# Patient Record
Sex: Male | Born: 1935 | Race: White | Hispanic: No | Marital: Married | Smoking: Former smoker
Health system: Southern US, Community
[De-identification: ages and names within clinical notes are randomized; demographics above are authoritative.]

## PROBLEM LIST (undated history)

## (undated) DIAGNOSIS — E875 Hyperkalemia: Secondary | ICD-10-CM

## (undated) DIAGNOSIS — Z8679 Personal history of other diseases of the circulatory system: Secondary | ICD-10-CM

## (undated) DIAGNOSIS — I639 Cerebral infarction, unspecified: Secondary | ICD-10-CM

## (undated) DIAGNOSIS — E785 Hyperlipidemia, unspecified: Secondary | ICD-10-CM

## (undated) DIAGNOSIS — C329 Malignant neoplasm of larynx, unspecified: Secondary | ICD-10-CM

## (undated) DIAGNOSIS — M8008XA Age-related osteoporosis with current pathological fracture, vertebra(e), initial encounter for fracture: Secondary | ICD-10-CM

## (undated) DIAGNOSIS — H53461 Homonymous bilateral field defects, right side: Secondary | ICD-10-CM

## (undated) DIAGNOSIS — I10 Essential (primary) hypertension: Secondary | ICD-10-CM

## (undated) DIAGNOSIS — E119 Type 2 diabetes mellitus without complications: Secondary | ICD-10-CM

## (undated) DIAGNOSIS — C760 Malignant neoplasm of head, face and neck: Secondary | ICD-10-CM

## (undated) DIAGNOSIS — R339 Retention of urine, unspecified: Secondary | ICD-10-CM

## (undated) HISTORY — PX: AMPUTATION FINGER / THUMB: SUR24

## (undated) HISTORY — DX: Essential (primary) hypertension: I10

## (undated) HISTORY — DX: Hyperkalemia: E87.5

## (undated) HISTORY — DX: Personal history of other diseases of the circulatory system: Z86.79

## (undated) HISTORY — DX: Age-related osteoporosis with current pathological fracture, vertebra(e), initial encounter for fracture: M80.08XA

## (undated) HISTORY — PX: PILONIDAL CYST EXCISION: SHX744

## (undated) HISTORY — PX: DOPPLER ECHOCARDIOGRAPHY: SHX263

## (undated) HISTORY — DX: Malignant neoplasm of head, face and neck: C76.0

## (undated) HISTORY — PX: RADICAL NECK DISSECTION: SHX2284

## (undated) HISTORY — PX: TOTAL LARYNGECTOMY: SHX2543

## (undated) HISTORY — DX: Hyperlipidemia, unspecified: E78.5

---

## 1995-02-25 ENCOUNTER — Encounter (INDEPENDENT_AMBULATORY_CARE_PROVIDER_SITE_OTHER): Payer: Self-pay | Admitting: Internal Medicine

## 2001-02-24 ENCOUNTER — Encounter (INDEPENDENT_AMBULATORY_CARE_PROVIDER_SITE_OTHER): Payer: Self-pay | Admitting: Internal Medicine

## 2001-02-24 LAB — CONVERTED CEMR LAB
Hgb A1c MFr Bld: 13.2 %
Microalbumin U total vol: 13.7 mg/L

## 2001-06-26 ENCOUNTER — Encounter (INDEPENDENT_AMBULATORY_CARE_PROVIDER_SITE_OTHER): Payer: Self-pay | Admitting: Internal Medicine

## 2001-06-26 LAB — CONVERTED CEMR LAB: Hgb A1c MFr Bld: 6.4 %

## 2001-10-26 ENCOUNTER — Encounter (INDEPENDENT_AMBULATORY_CARE_PROVIDER_SITE_OTHER): Payer: Self-pay | Admitting: Internal Medicine

## 2001-10-26 LAB — CONVERTED CEMR LAB: Hgb A1c MFr Bld: 6.7 %

## 2002-04-26 ENCOUNTER — Encounter (INDEPENDENT_AMBULATORY_CARE_PROVIDER_SITE_OTHER): Payer: Self-pay | Admitting: Internal Medicine

## 2002-04-26 LAB — CONVERTED CEMR LAB
Hgb A1c MFr Bld: 7 %
Microalbumin U total vol: 11.1 mg/L
PSA: 0.6 ng/mL

## 2002-10-26 ENCOUNTER — Encounter (INDEPENDENT_AMBULATORY_CARE_PROVIDER_SITE_OTHER): Payer: Self-pay | Admitting: Internal Medicine

## 2002-10-26 LAB — CONVERTED CEMR LAB: Hgb A1c MFr Bld: 6.8 %

## 2003-05-27 ENCOUNTER — Encounter (INDEPENDENT_AMBULATORY_CARE_PROVIDER_SITE_OTHER): Payer: Self-pay | Admitting: Internal Medicine

## 2003-10-27 ENCOUNTER — Encounter (INDEPENDENT_AMBULATORY_CARE_PROVIDER_SITE_OTHER): Payer: Self-pay | Admitting: Internal Medicine

## 2003-10-27 LAB — CONVERTED CEMR LAB: Hgb A1c MFr Bld: 9.4 %

## 2003-12-28 ENCOUNTER — Encounter (INDEPENDENT_AMBULATORY_CARE_PROVIDER_SITE_OTHER): Payer: Self-pay | Admitting: Internal Medicine

## 2003-12-28 LAB — CONVERTED CEMR LAB: Hgb A1c MFr Bld: 6.2 %

## 2004-06-26 ENCOUNTER — Encounter (INDEPENDENT_AMBULATORY_CARE_PROVIDER_SITE_OTHER): Payer: Self-pay | Admitting: Internal Medicine

## 2004-06-26 LAB — CONVERTED CEMR LAB: PSA: 0.4 ng/mL

## 2004-09-26 ENCOUNTER — Encounter (INDEPENDENT_AMBULATORY_CARE_PROVIDER_SITE_OTHER): Payer: Self-pay | Admitting: Internal Medicine

## 2004-10-17 ENCOUNTER — Ambulatory Visit: Payer: Self-pay | Admitting: Family Medicine

## 2004-12-27 ENCOUNTER — Encounter (INDEPENDENT_AMBULATORY_CARE_PROVIDER_SITE_OTHER): Payer: Self-pay | Admitting: Internal Medicine

## 2005-01-17 ENCOUNTER — Ambulatory Visit: Payer: Self-pay | Admitting: Family Medicine

## 2005-08-26 ENCOUNTER — Encounter (INDEPENDENT_AMBULATORY_CARE_PROVIDER_SITE_OTHER): Payer: Self-pay | Admitting: Internal Medicine

## 2005-08-26 LAB — CONVERTED CEMR LAB: Hgb A1c MFr Bld: 6.3 %

## 2005-09-18 ENCOUNTER — Ambulatory Visit: Payer: Self-pay | Admitting: Family Medicine

## 2005-10-09 ENCOUNTER — Ambulatory Visit: Payer: Self-pay | Admitting: Internal Medicine

## 2005-10-17 ENCOUNTER — Ambulatory Visit: Payer: Self-pay | Admitting: Internal Medicine

## 2005-11-26 HISTORY — PX: CATARACT EXTRACTION W/ INTRAOCULAR LENS  IMPLANT, BILATERAL: SHX1307

## 2006-08-26 ENCOUNTER — Encounter (INDEPENDENT_AMBULATORY_CARE_PROVIDER_SITE_OTHER): Payer: Self-pay | Admitting: Internal Medicine

## 2006-08-26 LAB — CONVERTED CEMR LAB
Hgb A1c MFr Bld: 7.7 %
PSA: 0.65 ng/mL

## 2006-09-04 ENCOUNTER — Ambulatory Visit: Payer: Self-pay | Admitting: Family Medicine

## 2006-09-19 ENCOUNTER — Ambulatory Visit: Payer: Self-pay | Admitting: Family Medicine

## 2006-10-01 ENCOUNTER — Ambulatory Visit: Payer: Self-pay | Admitting: Internal Medicine

## 2006-12-05 ENCOUNTER — Ambulatory Visit: Payer: Self-pay | Admitting: Internal Medicine

## 2006-12-05 LAB — CONVERTED CEMR LAB
AST: 28 units/L (ref 0–37)
Cholesterol: 102 mg/dL (ref 0–200)
HDL: 32.5 mg/dL — ABNORMAL LOW (ref >39.0)

## 2006-12-18 ENCOUNTER — Ambulatory Visit: Payer: Self-pay | Admitting: Family Medicine

## 2007-01-08 ENCOUNTER — Ambulatory Visit: Payer: Self-pay | Admitting: Family Medicine

## 2007-02-13 ENCOUNTER — Encounter: Payer: Self-pay | Admitting: Internal Medicine

## 2007-02-13 DIAGNOSIS — I4891 Unspecified atrial fibrillation: Secondary | ICD-10-CM | POA: Insufficient documentation

## 2007-02-13 DIAGNOSIS — I10 Essential (primary) hypertension: Secondary | ICD-10-CM | POA: Insufficient documentation

## 2007-02-13 DIAGNOSIS — E119 Type 2 diabetes mellitus without complications: Secondary | ICD-10-CM | POA: Insufficient documentation

## 2007-02-13 DIAGNOSIS — E785 Hyperlipidemia, unspecified: Secondary | ICD-10-CM | POA: Insufficient documentation

## 2007-09-03 ENCOUNTER — Ambulatory Visit: Payer: Self-pay | Admitting: Family Medicine

## 2007-09-03 ENCOUNTER — Encounter (INDEPENDENT_AMBULATORY_CARE_PROVIDER_SITE_OTHER): Payer: Self-pay | Admitting: *Deleted

## 2007-09-22 ENCOUNTER — Ambulatory Visit: Payer: Self-pay | Admitting: Family Medicine

## 2007-09-22 DIAGNOSIS — K921 Melena: Secondary | ICD-10-CM | POA: Insufficient documentation

## 2007-09-22 DIAGNOSIS — C329 Malignant neoplasm of larynx, unspecified: Secondary | ICD-10-CM | POA: Insufficient documentation

## 2007-09-23 ENCOUNTER — Telehealth (INDEPENDENT_AMBULATORY_CARE_PROVIDER_SITE_OTHER): Payer: Self-pay | Admitting: Internal Medicine

## 2007-09-23 LAB — CONVERTED CEMR LAB
AST: 30 units/L (ref 0–37)
BUN: 18 mg/dL (ref 6–23)
CO2: 33 meq/L — ABNORMAL HIGH (ref 19–32)
Calcium: 9.7 mg/dL (ref 8.4–10.5)
Chloride: 103 meq/L (ref 96–112)
Cholesterol: 126 mg/dL (ref 0–200)
Creatinine, Ser: 0.9 mg/dL (ref 0.4–1.5)
GFR calc Af Amer: 106 mL/min
Glucose, Bld: 137 mg/dL — ABNORMAL HIGH (ref 70–99)
Hgb A1c MFr Bld: 7.4 % — ABNORMAL HIGH (ref 4.6–6.0)
Microalb Creat Ratio: 85 mg/g — ABNORMAL HIGH (ref 0.0–30.0)
Microalb, Ur: 9.4 mg/dL — ABNORMAL HIGH (ref 0.0–1.9)

## 2007-09-26 ENCOUNTER — Encounter (INDEPENDENT_AMBULATORY_CARE_PROVIDER_SITE_OTHER): Payer: Self-pay | Admitting: Internal Medicine

## 2007-12-02 ENCOUNTER — Ambulatory Visit: Payer: Self-pay | Admitting: Family Medicine

## 2007-12-08 LAB — CONVERTED CEMR LAB: Hgb A1c MFr Bld: 7.1 % — ABNORMAL HIGH (ref 4.6–6.0)

## 2007-12-23 ENCOUNTER — Telehealth (INDEPENDENT_AMBULATORY_CARE_PROVIDER_SITE_OTHER): Payer: Self-pay | Admitting: Internal Medicine

## 2008-07-20 DIAGNOSIS — R079 Chest pain, unspecified: Secondary | ICD-10-CM | POA: Insufficient documentation

## 2008-08-03 ENCOUNTER — Ambulatory Visit: Payer: Self-pay | Admitting: Family Medicine

## 2008-09-07 ENCOUNTER — Telehealth (INDEPENDENT_AMBULATORY_CARE_PROVIDER_SITE_OTHER): Payer: Self-pay | Admitting: Internal Medicine

## 2008-09-21 ENCOUNTER — Encounter (INDEPENDENT_AMBULATORY_CARE_PROVIDER_SITE_OTHER): Payer: Self-pay | Admitting: Internal Medicine

## 2008-09-21 ENCOUNTER — Ambulatory Visit: Payer: Self-pay | Admitting: Family Medicine

## 2008-09-30 ENCOUNTER — Telehealth (INDEPENDENT_AMBULATORY_CARE_PROVIDER_SITE_OTHER): Payer: Self-pay | Admitting: Internal Medicine

## 2008-09-30 LAB — CONVERTED CEMR LAB
ALT: 20 units/L (ref 0–53)
BUN: 19 mg/dL (ref 6–23)
Basophils Relative: 0.3 % (ref 0.0–3.0)
Chloride: 103 meq/L (ref 96–112)
Creatinine, Ser: 1 mg/dL (ref 0.4–1.5)
Eosinophils Relative: 0.5 % (ref 0.0–5.0)
Glucose, Bld: 124 mg/dL — ABNORMAL HIGH (ref 70–99)
HCT: 46.6 % (ref 39.0–52.0)
HDL: 36.2 mg/dL — ABNORMAL LOW (ref >39.0)
Monocytes Absolute: 1.2 10*3/uL — ABNORMAL HIGH (ref 0.1–1.0)
Monocytes Relative: 8.3 % (ref 3.0–12.0)
Neutrophils Relative %: 76.4 % (ref 43.0–77.0)
PSA: 0.63 ng/mL (ref 0.10–4.00)
Platelets: 192 10*3/uL (ref 150–400)
Potassium: 4.4 meq/L (ref 3.5–5.1)
RBC: 4.61 M/uL (ref 4.22–5.81)
Total CHOL/HDL Ratio: 3
WBC: 14.4 10*3/uL — ABNORMAL HIGH (ref 4.5–10.5)

## 2008-11-04 ENCOUNTER — Ambulatory Visit: Payer: Self-pay | Admitting: Family Medicine

## 2009-09-22 ENCOUNTER — Ambulatory Visit: Payer: Self-pay | Admitting: Family Medicine

## 2009-09-22 ENCOUNTER — Encounter (INDEPENDENT_AMBULATORY_CARE_PROVIDER_SITE_OTHER): Payer: Self-pay | Admitting: Internal Medicine

## 2009-09-22 DIAGNOSIS — E1149 Type 2 diabetes mellitus with other diabetic neurological complication: Secondary | ICD-10-CM | POA: Insufficient documentation

## 2009-09-23 LAB — CONVERTED CEMR LAB
ALT: 19 units/L (ref 0–53)
AST: 32 units/L (ref 0–37)
BUN: 24 mg/dL — ABNORMAL HIGH (ref 6–23)
Calcium: 9.1 mg/dL (ref 8.4–10.5)
Creatinine, Ser: 1.2 mg/dL (ref 0.4–1.5)
GFR calc non Af Amer: 62.64 mL/min (ref >60)
Glucose, Bld: 122 mg/dL — ABNORMAL HIGH (ref 70–99)
Hgb A1c MFr Bld: 6.9 % — ABNORMAL HIGH (ref 4.6–6.5)
Microalb, Ur: 4.6 mg/dL — ABNORMAL HIGH (ref 0.0–1.9)
Total CHOL/HDL Ratio: 3

## 2009-10-24 ENCOUNTER — Ambulatory Visit: Payer: Self-pay | Admitting: Family Medicine

## 2009-10-24 DIAGNOSIS — E875 Hyperkalemia: Secondary | ICD-10-CM | POA: Insufficient documentation

## 2009-10-25 LAB — CONVERTED CEMR LAB: Potassium: 4.2 meq/L (ref 3.5–5.1)

## 2010-03-28 ENCOUNTER — Ambulatory Visit: Payer: Self-pay | Admitting: Family Medicine

## 2010-03-29 LAB — CONVERTED CEMR LAB
ALT: 19 units/L (ref 0–53)
AST: 28 units/L (ref 0–37)
Cholesterol: 117 mg/dL (ref 0–200)
LDL Cholesterol: 66 mg/dL (ref 0–99)
Microalb, Ur: 4.3 mg/dL — ABNORMAL HIGH (ref 0.0–1.9)
Total CHOL/HDL Ratio: 3
VLDL: 12.8 mg/dL (ref 0.0–40.0)

## 2010-05-10 ENCOUNTER — Encounter: Payer: Self-pay | Admitting: Family Medicine

## 2010-08-16 ENCOUNTER — Ambulatory Visit: Payer: Self-pay | Admitting: Family Medicine

## 2010-09-15 ENCOUNTER — Telehealth (INDEPENDENT_AMBULATORY_CARE_PROVIDER_SITE_OTHER): Payer: Self-pay | Admitting: *Deleted

## 2010-09-21 ENCOUNTER — Ambulatory Visit: Payer: Self-pay | Admitting: Family Medicine

## 2010-09-22 ENCOUNTER — Ambulatory Visit: Payer: Self-pay | Admitting: Family Medicine

## 2010-09-22 LAB — CONVERTED CEMR LAB
AST: 37 units/L (ref 0–37)
Albumin: 3.7 g/dL (ref 3.5–5.2)
Alkaline Phosphatase: 77 units/L (ref 39–117)
GFR calc non Af Amer: 66.96 mL/min (ref >60)
Glucose, Bld: 115 mg/dL — ABNORMAL HIGH (ref 70–99)
Potassium: 5.3 meq/L — ABNORMAL HIGH (ref 3.5–5.1)
Sodium: 135 meq/L (ref 135–145)
Total Bilirubin: 1.3 mg/dL — ABNORMAL HIGH (ref 0.3–1.2)
VLDL: 15.8 mg/dL (ref 0.0–40.0)

## 2010-09-26 ENCOUNTER — Ambulatory Visit: Payer: Self-pay | Admitting: Family Medicine

## 2010-09-26 ENCOUNTER — Encounter: Payer: Self-pay | Admitting: Family Medicine

## 2010-09-28 ENCOUNTER — Encounter: Payer: Self-pay | Admitting: Family Medicine

## 2010-09-28 LAB — CONVERTED CEMR LAB: Potassium: 5.2 meq/L — ABNORMAL HIGH (ref 3.5–5.1)

## 2010-10-02 ENCOUNTER — Telehealth: Payer: Self-pay | Admitting: Family Medicine

## 2010-10-10 ENCOUNTER — Ambulatory Visit: Payer: Self-pay | Admitting: Family Medicine

## 2010-10-12 ENCOUNTER — Encounter (INDEPENDENT_AMBULATORY_CARE_PROVIDER_SITE_OTHER): Payer: Self-pay | Admitting: *Deleted

## 2010-10-12 LAB — CONVERTED CEMR LAB: Fecal Occult Bld: NEGATIVE

## 2010-10-13 ENCOUNTER — Ambulatory Visit: Payer: Self-pay | Admitting: Family Medicine

## 2010-12-26 NOTE — Medication Information (Signed)
Summary: Letter Regarding [facility]ema[facility]  Letter Regarding [facility]   Imported By: Lanelle Bal 06/27/2010 10:50:00  _____________________________________________________________________  External Attachment:    Type:   Image     Comment:   External Document

## 2010-12-26 NOTE — Progress Notes (Signed)
----   Converted from flag ---- ---- 09/14/2010 1:41 PM, Crawford Givens MD wrote: cmet/lipid/A1c 250.00 psa v76.44  ---- 09/14/2010 9:58 AM, Liane Comber CMA (AAMA) wrote: Lab orders please! Good Morning! This pt is scheduled for cpx labs [place], which labs to draw and dx codes to use? Thanks Tasha ------------------------------

## 2010-12-26 NOTE — Miscellaneous (Signed)
Summary: Altace  Medications Added ALTACE 5 MG CAPS (RAMIPRIL) Take 1 capsule by mouth once a day       Clinical Lists Changes  Medications: Added new medication of ALTACE 5 MG CAPS (RAMIPRIL) Take 1 capsule by mouth once a day - Signed Removed medication of ALTACE 10 MG  CAPS (RAMIPRIL) Take 1 capsule by mouth once a day Rx of ALTACE 5 MG CAPS (RAMIPRIL) Take 1 capsule by mouth once a day;  #90 x 3;  Signed;  Entered by: Delilah Shan CMA (AAMA);  Authorized by: Crawford Givens MD;  Method [facility]: [id number] [address] [place], [place], [place]  [address], Ph: [phone], Fax: [phone]    Prescriptions: ALTACE 5 MG CAPS (RAMIPRIL) Take 1 capsule by mouth once a day  #90 x 3   Entered by:   Delilah Shan CMA (AAMA)   Authorized by:   Crawford Givens MD   Signed by:   Delilah Shan CMA (AAMA) on 09/28/2010   Method used:   [facility]        [facility]  [address] [phone]* (retail)       [address]       [place], [place]  [address]       Ph: [phone]       Fax: [phone]   RxID:   [id number]

## 2010-12-26 NOTE — Assessment & Plan Note (Signed)
Summary: CPX/XFER FROM BILLIE/CLE   Vital Signs:  Patient profile:   77 year old male Height:      74 inches Weight:      236.50 pounds BMI:     30.47 Temp:     98.4 degrees F oral Pulse rate:   84 / minute Pulse rhythm:   regular BP sitting:   116 / 76  (left arm) Cuff size:   large  Vitals Entered By: Delilah Shan CMA  Dull) (September 26, 2010 8:39 AM) CC: CPX - Transfer from BDB.  Patient says he will need an x-ray because Dr. Ezzard Standing said when he did surgery, he would need an x-ray annually., Preventive Care   History of Present Illness: Hypertension:  Using medication without problems or lightheadedness: yes Chest pain with exertion:no Edema:no Short of breath:no  Elevated Cholesterol: Using medications without problems:yes Muscle aches: no Other complaints: no  Diabetes:  Using medications without difficulties:yes Hypoglycemic episodes:no Hyperglycemic episodes:no Feet problems:no Blood Sugars averaging:85-155 eye exam within last year: yes.   Hyperkalemia.  Due for recheck.  See plan.   Throat CA.  Had rec for annual CXR since he has trach.  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 Using external unit for phonation.  Follows through with routine trach care.    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 EF 60%--01/18/1994,  12/02/02--at fib, EF 50-55 echo-ra,la,lv,mild dilat.,trace mitral regurg,mild pulm htn  Family History: Reviewed history and no changes required. F dead, cirrhosis M dead, old age  Social History: Reviewed history from 09/21/2008 and no changes required. 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  Otherwise negative.    Physical Exam  General:  GEN: nad, alert and oriented, using external unit to converse.  HEENT: mucous membranes moist, TM w/o acute change NECK: supple w/o LA, trach site clean and intact, post surgical changes to neck noted.  CV: regular rate and rhythm, no ectopy PULM: ctab, no inc wob ABD: soft, +bs EXT: trace edema on R leg, at baseline, no edema on L leg, 1+ DP pulses bilaterally SKIN: no acute rash but post-surgical changes noted in gluteal cleft.  distal amputation of R fingers noted.  Rectal:  Post surgical changes noted.  Normal sphincter tone. No rectal masses or tenderness. Prostate:  Prostate gland firm and smooth, mild enlargement, but no nodularity, tenderness, mass, asymmetry or induration.  Diabetes Management Exam:    Foot Exam (with socks and/or shoes not present):       Sensory-Pinprick/Light touch:          Left medial foot (L-4): normal          Left dorsal foot (L-5): normal          Left lateral foot (S-1): normal          Right medial foot (L-4): normal          Right dorsal foot (L-5): normal          Right lateral foot (S-1): normal       Sensory-Monofilament:          Left foot: normal          Right  foot: normal       Inspection:          Left foot: normal          Right foot: normal       Nails:          Left foot: normal          Right foot: normal   Impression & Recommendations:  Problem # 1:  HYPERPOTASSEMIA (ICD-276.7) See notes on labs.  will decrease ACE and recheck.   Orders: TLB-Potassium (K+) (84132-K)  Problem # 2:  SPECIAL SCREENING MALIGNANT NEOPLASM OF PROSTATE (ICD-V76.44) PSA reviewed with patient.   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  No LA.  CXR done today due to dust exposure with farming.   d/w patient ZO:XWRUE care.  Orders: T-2 View CXR (71020TC)  Problem # 4:  HYPERTENSION (ICD-401.9) controlled.  His  updated medication list for this problem includes:    Altace 10 Mg Caps (Ramipril) .Marland Kitchen... Take 1 capsule by mouth once a day    Hydrochlorothiazide 25 Mg Tabs (Hydrochlorothiazide) .Marland Kitchen... 1/2 tab every am  Problem # 5:  HYPERLIPIDEMIA (ICD-272.4) No chagne in meds.  labs d/w patient.   His updated medication list for this problem includes:    Zocor 40 Mg Tabs (Simvastatin) .Marland Kitchen... Take 1 tablet by mouth once a day  Problem # 6:  DIABETES MELLITUS, TYPE II (ICD-250.00) See notes on labs.  controlled.  His updated medication list for this problem includes:    Altace 10 Mg Caps (Ramipril) .Marland Kitchen... Take 1 capsule by mouth once a day    Bayer Aspirin 325 Mg Tabs (Aspirin) .Marland Kitchen... Take 1 tablet by mouth once a day    Actos 45 Mg Tabs (Pioglitazone hcl) .Marland Kitchen... Take 1 tablet by mouth once a day    Metformin Hcl 500 Mg Tb24 (Metformin hcl) .Marland Kitchen... 1 two times a day  Problem # 7:  Preventive Health Care (ICD-V70.0) I sent patient home with IFOB.  We talked about options (colonoscopy vs other and he elects for IFOB).    Complete Medication List: 1)  Altace 10 Mg Caps (Ramipril) .... Take 1 capsule by mouth once a day 2)  Bayer Aspirin 325 Mg Tabs (Aspirin) .... Take 1 tablet by mouth once a day 3)  Actos 45 Mg Tabs (Pioglitazone hcl) .... Take 1 tablet by mouth once a day 4)  Zocor 40 Mg Tabs (Simvastatin) .... Take 1 tablet by mouth once a day 5)  Hydrochlorothiazide 25 Mg Tabs (Hydrochlorothiazide) .... 1/2 tab every am 6)  Metformin Hcl 500 Mg Tb24 (Metformin hcl) .Marland Kitchen.. 1 two times a day  Colorectal Screening:  Current Recommendations:    Hemoccult: NEG X 1 today; Given X 3    Colonoscopy recommended: patient refused  PSA Screening:    PSA: 0.59  (09/21/2010)  Immunization & Chemoprophylaxis:    Tetanus vaccine: Td  (09/19/2006)    Influenza vaccine: Fluvax 3+  (08/16/2010)    Pneumovax: Pneumovax  (09/22/2009)  Patient Instructions: 1)  I'll send word about the chest X ray.  Drop off the stool  cards.   2)  I'd like to see you back in 6 months for a 30 min appointment.  Please get an A1c done a few days before.  Dx 250.00/ 3)  Take care.  Glad to see you today.  Prescriptions: METFORMIN HCL 500 MG  TB24 (METFORMIN HCL) 1 two times a day  #180 x 3  Entered by:   Delilah Shan CMA (AAMA)   Authorized by:   Crawford Givens MD   Signed by:   Delilah Shan CMA (AAMA) on 09/26/2010   Method used:   [facility]        [facility]  [address] [phone] (retail)       [address]       [place], [place]  [address]       Ph: [phone]       Fax: [phone]   RxID:   [id number] HYDROCHLOROTHIAZIDE 25 MG  TABS (HYDROCHLOROTHIAZIDE) 1/2 tab every am  #45 x 3   Entered by:   Delilah Shan CMA (AAMA)   Authorized by:   Crawford Givens MD   Signed by:   Delilah Shan CMA (AAMA) on 09/26/2010   Method used:   [facility]        [facility]  [address] [phone]* (retail)       [address]       [place], [place]  [address]       Ph: [phone]       Fax: [phone]   RxID:   [id number] ZOCOR 40 MG  TABS (SIMVASTATIN) Take 1 tablet by mouth once a day  #90 x 3   Entered by:   Delilah Shan CMA (AAMA)   Authorized by:   Crawford Givens MD   Signed by:   Delilah Shan CMA (AAMA) on 09/26/2010   Method used:   [facility]        [facility]  [address] [phone]* (retail)       [address]       [place], [place]  [address]       Ph: [phone]       Fax: [phone]   RxID:   [id number] ACTOS 45 MG  TABS (PIOGLITAZONE HCL) Take 1 tablet by mouth once a day  #90 x 3   Entered by:   Delilah Shan CMA (AAMA)   Authorized by:   Crawford Givens MD   Signed by:   Delilah Shan CMA (AAMA) on 09/26/2010   Method used:   [facility]        [facility]  [address] [phone]* (retail)       [address]       [place], [place]  [address]       Ph: [phone]       Fax: [phone]   RxID:    [id number] ALTACE 10 MG  CAPS (RAMIPRIL) Take 1 capsule by mouth once a day  #90 x 3   Entered by:   Delilah Shan CMA (AAMA)   Authorized by:   Crawford Givens MD   Signed by:   Delilah Shan CMA (AAMA) on 09/26/2010   Method used:   [facility]        [facility]  [address] [phone]* (retail)       [address]       [place], [place]  [address]       Ph: [phone]       Fax: [phone]   RxID:   [id number]    Orders Added: 1)  Est. Patient Level IV [65681] 2)  T-2 View CXR [  71020TC] 3)  TLB-Potassium (K+) [88416-S]    Current Allergies (reviewed today): No known allergies    Prevention & Chronic Care Immunizations   Influenza vaccine: Fluvax 3+  (08/16/2010)    Tetanus booster: 09/19/2006: Td    Pneumococcal vaccine: Pneumovax  (09/22/2009)    H. zoster vaccine: 12/02/2007: Zostavax  Colorectal Screening   Hemoccult: Positive  (09/26/2005)   Hemoccult action/deferral: NEG X 1 today; Given X 3  (09/26/2010)    Colonoscopy: Not documented   Colonoscopy action/deferral: patient refused  (09/26/2010)  Other Screening   PSA: 0.59  (09/21/2010)   Smoking status: quit  (02/13/2007)  Diabetes Mellitus   HgbA1C: 6.8  (09/21/2010)    Eye exam: Not documented    Foot exam: yes  (09/26/2010)   High risk foot: Not documented   Foot care education: Not documented    Urine microalbumin/creatinine ratio: 37.3  (03/28/2010)  Lipids   Total Cholesterol: 110  (09/21/2010)   LDL: 56  (09/21/2010)   LDL Direct: Not documented   HDL: 37.90  (09/21/2010)   Triglycerides: 79.0  (09/21/2010)    SGOT (AST): 37  (09/21/2010)   SGPT (ALT): 22  (09/21/2010)   Alkaline phosphatase: 77  (09/21/2010)   Total bilirubin: 1.3  (09/21/2010)  Hypertension   Last Blood Pressure: 116 / 76  (09/26/2010)   Serum creatinine: 1.1  (09/21/2010)   Serum potassium 5.5  (09/22/2010)  Self-Management Support :    Diabetes self-management support:  Not documented    Hypertension self-management support: Not documented    Lipid self-management support: Not documented      Appended Document: CPX/XFER FROM BILLIE/CLE I d/w patient re: actos.  At this point, there is no contraindication and he has no h/o bladder CA.  He stopped smoking.  We discussed risk/benefit.  His DM2 is controlled and he would like to continue his meds as is.  I support this.

## 2010-12-26 NOTE — Assessment & Plan Note (Signed)
Summary: FLU SHOT/Diona Peregoy/JRR   Nurse Visit   Allergies: No Known Drug Allergies  Immunizations Administered:  Influenza Vaccine # 1:    Vaccine Type: Fluvax 3+    Site: left deltoid    Mfr: GlaxoSmithKline    Dose: 0.5 ml    Route: IM    Given by: Mervin Hack CMA (AAMA)    Exp. Date: 05/26/2011    Lot #: [id number]    VIS given: 06/20/10 version given August 16, 2010.  Flu Vaccine Consent Questions:    Do you have a history of severe allergic reactions to this vaccine? no    Any prior history of allergic reactions to egg and/or gelatin? no    Do you have a sensitivity to the preservative Thimersol? no    Do you have a past history of Guillan-Barre Syndrome? no    Do you currently have an acute febrile illness? no    Have you ever had a severe reaction to latex? no    Vaccine information given and explained to patient? yes  Orders Added: 1)  Flu Vaccine 20yrs + [90658] 2)  Admin 1st Vaccine [84696]

## 2010-12-26 NOTE — Progress Notes (Signed)
Summary: Concerns about refills  Phone Note Call from Patient   Caller: Patient Details for Reason: Concerns with Medication Refill Summary of Call: Patient called and reported being on hold for about 10 minutes and had concerns about medication refills.  From my understanding, Pt wanted a 30 day supply from [facility] and [facility]  Patient also received a 90 day supply from [facility] with 10mg  and it shouldn't have been from [facility].  Patient stated that the medication was not ordered according to the discussion with the physician and patient did not receive the amount needed at the pharmacy requested.   Patient does not need the 10mg  Ramivril from [facility].  Pt concerned with returning the 10mg  90 day supply to [facility] and not being responsible for the cost.  I explained, without knowing the details of this refill request, that we work with the pharmacies to provide the patients with the needed medication at the recommended dose and at the pharmacy of their choice and that pharmacies and mail-order pharmacies often send back scripts with different dosing and supply information on the authorizations and that we may have been trying to work through some of that.  I also explained that the refill requests and changes in dose and qty. may have gotten crossed up and there may have been miscommunication or overlap in the time it took to change the prescriptions with the two pharmacies.  I apologized for any inconvenience or miscommunication.  I went on to explain  that the patient would need to call [facility] to find out about returning the medication and that the doctor's office does not typically become involved with returning meds.     Patient states that he has the medication that he needs now and it's his understanding from discussion with clinical staff that meds will be discussed after his lab visit.    Please call patient to explain any timeframes and communication surrounding this refill  and any information that would help him understand how the refills were made in relationship to his concerns.  Please report outcome to Production designer, theatre/television/film.  Initial call taken by: Clarisa Schools,  October 02, 2010 3:01 PM  Follow-up for Phone Call        Dr. Para March,  Pt stated that you discussed pt taking half a pill at 5mg  and patient could not cut pill in half because it was a capsule.  Pt's received a refill of 10mg  and said you had prescribed 5mg .  Please advise and provide feedback on prescription in order for office to follow up with patient.  Thanks, Phyllis Follow-up by: Clarisa Schools,  October 02, 2010 3:09 PM  Additional Follow-up for Phone Call Additional follow up Details #1::        The prescription for the Ramipril 10 mg. capsule was sent to [facility] at his OV on 09/26/10.  After his lab results came back, his Potassium was still high so Dr. Para March asked that he cut his Ramipril in half.  It is a capsule so it cannot be cut in half so [facility]'s [facility].  The order for the 10 mg. to [facility] had already been sent prior to that change on 09/28/10.  Additional Follow-up by: Delilah Shan CMA Duncan Dull),  October 02, 2010 4:13 PM      I talked with him also and explained that I would follow up on his concerns about not being able to  talk to anyone at the office.  I asked him a few questions about his wait time and he said that he waited 10 minutes which I question only because our system is set up to send phones to another location if staff is not logged on and if the call waits over a certain amount of time (which was programmed to be 3 or 4 minutes for the option he selected).  I did not get into that with him because I truly believe him when he says he had delays; however, not sure that his delays may be related to transfers or asked to hold because staff were on the other line or trying to get information for him.   I am sure that the automated system can be a concern for  some patients and am glad to get his feedback so I can look into how we have the phones set up.    Dr. Para March was going to call him and address his med refill concerns.  To no one's fault, it appeared that there was some timing issues with the refills and when things were sent versus documentation being in place due to EMR system.    PS 10/02/10 5:00pm  Appended Document: Concerns about refills I called the patient and talked with him about this.  I told him that I understood his complaint and that Lugene and I would discuss our protocol to prevent this from happening again.  We have done so.  I will await his follow up labs on 10/13/10.  I thanked him for taking the call.

## 2010-12-26 NOTE — Letter (Signed)
Summary: [facility]: Immunoassay Fecal Occult Blood (iFOB) Order Form  [place] at [facility]  [address] [place], [place] [address]   Phone: [phone]  Fax: [phone]      [facility]: Immunoassay Fecal Occult Blood (iFOB) Order Form   September 26, 2010 MRN: [id number]   BRINLEY TREANOR 1932/05/10   Physicican Name:________duncan_________________  Diagnosis Code:_________v76.49_________________      Crawford Givens MD

## 2010-12-26 NOTE — Letter (Signed)
Summary: Results Follow up Letter  [place] at [facility]  [address] [place], [place] [address]   Phone: [phone]  Fax: [phone]    10/12/2010 MRN: [id number]    KENSHAWN MACIOLEK [address] [place], [place]  [address]    Dear Mr. DOANE,  The following are the results of your recent test(s):  Test         Result    Pap Smear:        Normal _____  Not Normal _____ Comments: ______________________________________________________ Cholesterol: LDL(Bad cholesterol):         Your goal is less than:         HDL (Good cholesterol):       Your goal is more than: Comments:  ______________________________________________________ Mammogram:        Normal _____  Not Normal _____ Comments:  ___________________________________________________________________ Hemoccult:        Normal __X___  Not normal _______ Comments:  Yearly follow up is recommended.   _____________________________________________________________________ Other Tests:    We routinely do not discuss normal results over the telephone.  If you desire a copy of the results, or you have any questions about this information we can discuss them at your next office visit.   Sincerely,   Dwana Curd. Para March, M.D.  [facility]

## 2010-12-26 NOTE — Miscellaneous (Signed)
Summary: Kayexalate  Medications Added KAYEXALATE  POWD (SODIUM POLYSTYRENE SULFONATE) Take 15 g. today 09/22/10 and tomorrow 09/23/10.       Clinical Lists Changes  Medications: Added new medication of KAYEXALATE  POWD (SODIUM POLYSTYRENE SULFONATE) Take 15 g. today 09/22/10 and tomorrow 09/23/10. - Signed Rx of KAYEXALATE  POWD (SODIUM POLYSTYRENE SULFONATE) Take 15 g. today 09/22/10 and tomorrow 09/23/10.;  #30 g. x 0;  Signed;  Entered by: Delilah Shan CMA (AAMA);  Authorized by: Crawford Givens MD;  Method used: [id number] [address] [place], [place], [place]  [address], Ph: [phone], Fax: [phone]    Prescriptions: KAYEXALATE  POWD (SODIUM POLYSTYRENE SULFONATE) Take 15 g. today 09/22/10 and tomorrow 09/23/10.  #30 g. x 0   Entered by:   Delilah Shan CMA (AAMA)   Authorized by:   Crawford Givens MD   Signed by:   Delilah Shan CMA (AAMA) on 09/22/2010   Method used:   [facility]        [facility]  [address] [phone]* (retail)       [address]       [place], [place]  [address]       Ph: [phone]       Fax: [phone]   RxID:   [id number]

## 2011-09-20 ENCOUNTER — Ambulatory Visit (INDEPENDENT_AMBULATORY_CARE_PROVIDER_SITE_OTHER): Payer: Medicare Other

## 2011-09-20 DIAGNOSIS — Z23 Encounter for immunization: Secondary | ICD-10-CM

## 2011-10-11 ENCOUNTER — Other Ambulatory Visit: Payer: Self-pay | Admitting: *Deleted

## 2011-10-11 MED ORDER — SIMVASTATIN 40 MG PO TABS: ORAL_TABLET | ORAL | Status: DC

## 2011-10-11 MED ORDER — HYDROCHLOROTHIAZIDE 25 MG PO TABS: ORAL_TABLET | ORAL | Status: DC

## 2011-10-16 ENCOUNTER — Other Ambulatory Visit: Payer: Self-pay | Admitting: Family Medicine

## 2011-10-16 DIAGNOSIS — E119 Type 2 diabetes mellitus without complications: Secondary | ICD-10-CM

## 2011-10-17 ENCOUNTER — Other Ambulatory Visit (INDEPENDENT_AMBULATORY_CARE_PROVIDER_SITE_OTHER): Payer: Medicare Other

## 2011-10-17 DIAGNOSIS — E119 Type 2 diabetes mellitus without complications: Secondary | ICD-10-CM

## 2011-10-17 LAB — COMPREHENSIVE METABOLIC PANEL
ALT: 21 U/L (ref 0–53)
CO2: 30 mEq/L (ref 19–32)
Calcium: 9.4 mg/dL (ref 8.4–10.5)
Chloride: 103 mEq/L (ref 96–112)
GFR: 66.77 mL/min (ref >60.00)
Glucose, Bld: 107 mg/dL — ABNORMAL HIGH (ref 70–99)
Sodium: 140 mEq/L (ref 135–145)
Total Protein: 7.6 g/dL (ref 6.0–8.3)

## 2011-10-17 LAB — LIPID PANEL
Total CHOL/HDL Ratio: 2
Triglycerides: 39 mg/dL (ref 0.0–149.0)

## 2011-10-19 LAB — HEMOGLOBIN A1C: Hgb A1c MFr Bld: 6.9 % — ABNORMAL HIGH (ref 4.6–6.5)

## 2011-10-22 ENCOUNTER — Encounter: Payer: Self-pay | Admitting: Family Medicine

## 2011-10-23 ENCOUNTER — Ambulatory Visit (INDEPENDENT_AMBULATORY_CARE_PROVIDER_SITE_OTHER): Payer: Medicare Other | Admitting: Family Medicine

## 2011-10-23 ENCOUNTER — Encounter: Payer: Self-pay | Admitting: Family Medicine

## 2011-10-23 ENCOUNTER — Ambulatory Visit (INDEPENDENT_AMBULATORY_CARE_PROVIDER_SITE_OTHER)
Admission: RE | Admit: 2011-10-23 | Discharge: 2011-10-23 | Disposition: A | Discharge status: Home / Self Care | Payer: Medicare Other | Source: Ambulatory Visit | Attending: Family Medicine | Admitting: Family Medicine

## 2011-10-23 VITALS — BP 132/72 | HR 94 | Temp 97.9°F | Wt 245.1 lb

## 2011-10-23 DIAGNOSIS — I10 Essential (primary) hypertension: Secondary | ICD-10-CM

## 2011-10-23 DIAGNOSIS — C329 Malignant neoplasm of larynx, unspecified: Secondary | ICD-10-CM

## 2011-10-23 DIAGNOSIS — E119 Type 2 diabetes mellitus without complications: Secondary | ICD-10-CM

## 2011-10-23 DIAGNOSIS — Z125 Encounter for screening for malignant neoplasm of prostate: Secondary | ICD-10-CM

## 2011-10-23 DIAGNOSIS — E785 Hyperlipidemia, unspecified: Secondary | ICD-10-CM

## 2011-10-23 DIAGNOSIS — I4891 Unspecified atrial fibrillation: Secondary | ICD-10-CM

## 2011-10-23 DIAGNOSIS — Z1211 Encounter for screening for malignant neoplasm of colon: Secondary | ICD-10-CM

## 2011-10-23 MED ORDER — SIMVASTATIN 40 MG PO TABS: ORAL_TABLET | ORAL | Status: DC

## 2011-10-23 MED ORDER — RAMIPRIL 5 MG PO CAPS: 5.0000 mg | ORAL_CAPSULE | Freq: Every day | ORAL | Status: DC

## 2011-10-23 MED ORDER — HYDROCHLOROTHIAZIDE 12.5 MG PO TABS: 12.5000 mg | ORAL_TABLET | Freq: Every day | ORAL | Status: DC

## 2011-10-23 MED ORDER — PIOGLITAZONE HCL 45 MG PO TABS: 45.0000 mg | ORAL_TABLET | Freq: Every day | ORAL | Status: DC

## 2011-10-23 MED ORDER — METFORMIN HCL 500 MG PO TABS: 500.0000 mg | ORAL_TABLET | Freq: Two times a day (BID) | ORAL | Status: DC

## 2011-10-23 NOTE — Assessment & Plan Note (Addendum)
Will check CXR given the trach.  See notes on CXR.

## 2011-10-23 NOTE — Patient Instructions (Signed)
Schedule a physical in 1 year with labs ahead of time.  Let me know if you have concerns in the meantime.  Take care.

## 2011-10-23 NOTE — Progress Notes (Signed)
AF: not on coumadin.  No chest pain.  Good exercise tolerance, was spitting wood the day before exam.  No ble edema, not sob.  See plan.   Diabetes:  Using medications without difficulties:yes Hypoglycemic episodes:no Hyperglycemic episodes:no Feet problems: no Blood Sugars averaging: 90-120 eye exam within last year: yes, 3-4 months ago  Hypertension:    Using medication without problems or lightheadedness: yes Chest pain with exertion:no Edema:no Short of breath:no  Throat cancer, h/o of: trach care per routine, due for CXR.  Some secretions, cleared by patient.   Using external phonation device.  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    PMH and SH reviewed.   Vital signs, Meds and allergies reviewed.  ROS: See HPI.  Otherwise nontributory.   GEN: nad, alert and oriented HEENT: mucous membranes moist NECK: supple w/o LA, trach site intact CV: IRR.   PULM: ctab, no inc wob ABD: soft, +bs EXT: no edema SKIN: no acute rash Chronic changes to gluteal crease noted Prostate gland firm and smooth, no enlargement, nodularity, tenderness, mass, asymmetry or induration. R hand with partial finger amputations noted.   Diabetic foot exam: Normal inspection No skin breakdown No calluses  Normal DP pulses Normal sensation to light tough and monofilament Nails thickened

## 2011-10-25 ENCOUNTER — Encounter: Payer: Self-pay | Admitting: Family Medicine

## 2011-10-25 DIAGNOSIS — Z125 Encounter for screening for malignant neoplasm of prostate: Secondary | ICD-10-CM | POA: Insufficient documentation

## 2011-10-25 DIAGNOSIS — Z1211 Encounter for screening for malignant neoplasm of colon: Secondary | ICD-10-CM | POA: Insufficient documentation

## 2011-10-25 NOTE — Assessment & Plan Note (Signed)
D/w patient re:options for colon cancer screening, including IFOB vs. colonoscopy.  Risks and benefits of both were discussed and patient voiced understanding.  Pt elects for:IFOB  

## 2011-10-25 NOTE — Assessment & Plan Note (Addendum)
Still in AF, based on chads score he would be a candidate for coumadin but he declines.  He is aware of CVA risk.  He elects to continue with ASA and declined cards referral.  No CP, good exercise tolerance and no tachycardia.  >45 min spent with face to face with patient, >50% counseling and/or coordinating care.

## 2011-10-25 NOTE — Assessment & Plan Note (Signed)
PSA options were discussed along with recent recs.  No indication for psa at this point, since patient is low risk and there is no FH of prosate CA.  He declined testing of PSA.  

## 2011-10-25 NOTE — Assessment & Plan Note (Signed)
Controlled, with good sensation in feet on exam.  No change in meds.  We talked about actos.  At this point, there is no clear evidence to direct a change in meds (re: actos).  Med is tolerated (no h/o bladder CA and no h/o blood in urine) and benefit from control of DM2 outweighs other considerations.  Pt agrees to continue actos.

## 2011-10-25 NOTE — Assessment & Plan Note (Signed)
Controlled , no change in meds 

## 2011-11-08 ENCOUNTER — Other Ambulatory Visit: Payer: Medicare Other

## 2011-11-08 DIAGNOSIS — Z1289 Encounter for screening for malignant neoplasm of other sites: Secondary | ICD-10-CM

## 2011-11-08 DIAGNOSIS — Z1211 Encounter for screening for malignant neoplasm of colon: Secondary | ICD-10-CM

## 2011-11-08 LAB — FECAL OCCULT BLOOD, IMMUNOCHEMICAL: Fecal Occult Bld: NEGATIVE

## 2011-11-13 ENCOUNTER — Encounter: Payer: Self-pay | Admitting: *Deleted

## 2011-11-26 ENCOUNTER — Other Ambulatory Visit: Payer: Self-pay | Admitting: Internal Medicine

## 2011-11-26 MED ORDER — PIOGLITAZONE HCL 45 MG PO TABS: 45.0000 mg | ORAL_TABLET | Freq: Every day | ORAL | Status: DC

## 2011-11-26 NOTE — Telephone Encounter (Signed)
[facility] because [facility] will take 7 to 10 days before he receives his medication.

## 2011-12-13 ENCOUNTER — Ambulatory Visit: Payer: Medicare Other | Admitting: Family Medicine

## 2011-12-13 DIAGNOSIS — Z0289 Encounter for other administrative examinations: Secondary | ICD-10-CM

## 2011-12-20 ENCOUNTER — Encounter: Payer: Self-pay | Admitting: Family Medicine

## 2011-12-20 ENCOUNTER — Ambulatory Visit (INDEPENDENT_AMBULATORY_CARE_PROVIDER_SITE_OTHER): Payer: Medicare Other | Admitting: Family Medicine

## 2011-12-20 VITALS — BP 126/58 | HR 92 | Temp 98.3°F | Wt 244.0 lb

## 2011-12-20 DIAGNOSIS — L989 Disorder of the skin and subcutaneous tissue, unspecified: Secondary | ICD-10-CM

## 2011-12-20 NOTE — Assessment & Plan Note (Signed)
Concern for SCC/BCC.  Given the location, refer to derm.  No charge for visit today.  D/w pt.

## 2011-12-20 NOTE — Progress Notes (Signed)
2 lesions, each ~67mm across, on the nose.  Poorly healing, will scab over and then flake off, but not fully heal.  Will then scab again.  Present for months.   Meds, vitals, and allergies reviewed.   ROS: See HPI.  Otherwise, noncontributory.  nad ncat  2 lesions, each about 3mm with peripheral induration and central depression, on the nose Neck supple, trach site noted.

## 2011-12-20 NOTE — Patient Instructions (Addendum)
See Shirlee Limerick about your referral before you leave today. No charge on the visit today.

## 2012-05-12 ENCOUNTER — Telehealth: Payer: Self-pay | Admitting: Family Medicine

## 2012-05-12 DIAGNOSIS — E119 Type 2 diabetes mellitus without complications: Secondary | ICD-10-CM

## 2012-05-12 NOTE — Telephone Encounter (Signed)
Get patient schedule for DM2 check with A1c ahead of time. Retinopathy on eye exam at eye clinic.

## 2012-05-12 NOTE — Telephone Encounter (Signed)
Patient advised.  Appt scheduled 05/19/2012.

## 2012-05-19 ENCOUNTER — Encounter: Payer: Self-pay | Admitting: *Deleted

## 2012-05-19 ENCOUNTER — Encounter: Payer: Self-pay | Admitting: Family Medicine

## 2012-05-19 ENCOUNTER — Telehealth: Payer: Self-pay | Admitting: Family Medicine

## 2012-05-19 ENCOUNTER — Ambulatory Visit (INDEPENDENT_AMBULATORY_CARE_PROVIDER_SITE_OTHER): Payer: Medicare Other | Admitting: Family Medicine

## 2012-05-19 VITALS — BP 132/70 | HR 83 | Temp 98.3°F | Wt 237.0 lb

## 2012-05-19 DIAGNOSIS — E1139 Type 2 diabetes mellitus with other diabetic ophthalmic complication: Secondary | ICD-10-CM

## 2012-05-19 DIAGNOSIS — E11319 Type 2 diabetes mellitus with unspecified diabetic retinopathy without macular edema: Secondary | ICD-10-CM

## 2012-05-19 DIAGNOSIS — E119 Type 2 diabetes mellitus without complications: Secondary | ICD-10-CM

## 2012-05-19 DIAGNOSIS — H579 Unspecified disorder of eye and adnexa: Secondary | ICD-10-CM

## 2012-05-19 LAB — HEMOGLOBIN A1C: Hgb A1c MFr Bld: 6.8 % — ABNORMAL HIGH (ref 4.6–6.5)

## 2012-05-19 NOTE — Progress Notes (Signed)
Diabetes:  Using medications without difficulties: yes Hypoglycemic episodes: yes Hyperglycemic episodes: no Feet problems: no Blood Sugars averaging: 87-107 eye exam within last year: yes A1c pending.  Prev controlled at 6.9 "I feel good."  He's been busy and is happy with his work load.   Meds, vitals, and allergies reviewed.   ROS: See HPI.  Otherwise negative.    GEN: nad, alert and oriented HEENT: mucous membranes moist NECK: supple, trach noted, chronic changes.  CV: rrr. PULM: ctab, no inc wob ABD: soft, +bs EXT: no edema

## 2012-05-19 NOTE — Assessment & Plan Note (Signed)
Controlled with home readings.  A1c is pending.  Continue current meds.  Appears to be doing well with sugar control.  See notes on labs.  He's planning on retiring/cutting back his work load next year.

## 2012-05-19 NOTE — Patient Instructions (Addendum)
Go to the lab on the way out.  We'll contact you with your lab report.  Take care.  Glad to see you.  Recheck labs this fall.

## 2012-05-19 NOTE — Addendum Note (Signed)
Addended by: Alvina Chou on: 05/19/2012 08:42 AM   Modules accepted: Orders

## 2012-05-19 NOTE — Telephone Encounter (Signed)
David [name] this patient came in this morning and stated that when he was here for his last visit, that Dr. Para March said he was not charging him for the visit.  When he came back to the front to get his co pay back he was told that his check had already been deposited [name] he never got it back and has not heard from anyone regarding his refund.  Patient wants this looked into and would like for someone to give him a call back either from here or the billing office.

## 2012-05-27 NOTE — Telephone Encounter (Signed)
Please see previous notes from patient regarding his charges.  Did you intend for him to have no charges for this visit?  Please advise on your intentions and I will advise patient and follow up with patient billing.  Thanks

## 2012-05-27 NOTE — Telephone Encounter (Signed)
No charge 1/24.  Charge 6/24.

## 2012-09-02 ENCOUNTER — Ambulatory Visit (INDEPENDENT_AMBULATORY_CARE_PROVIDER_SITE_OTHER): Payer: Medicare Other

## 2012-09-02 DIAGNOSIS — Z23 Encounter for immunization: Secondary | ICD-10-CM

## 2012-10-08 ENCOUNTER — Other Ambulatory Visit: Payer: Self-pay | Admitting: Family Medicine

## 2012-10-08 DIAGNOSIS — E119 Type 2 diabetes mellitus without complications: Secondary | ICD-10-CM

## 2012-10-14 ENCOUNTER — Other Ambulatory Visit: Payer: Self-pay

## 2012-10-14 MED ORDER — RAMIPRIL 5 MG PO CAPS: 5.0000 mg | ORAL_CAPSULE | Freq: Every day | ORAL | Status: DC

## 2012-10-14 NOTE — Telephone Encounter (Signed)
pts wife request refill altace to [facility] until pts appt on 10/22/12 for CPX. I advised pts wife refill sent as requested but pts CPX is scheduled for 10/24/12 at 11:30. Mrs stiggers understood.

## 2012-10-17 ENCOUNTER — Other Ambulatory Visit (INDEPENDENT_AMBULATORY_CARE_PROVIDER_SITE_OTHER): Payer: Medicare Other

## 2012-10-17 DIAGNOSIS — E119 Type 2 diabetes mellitus without complications: Secondary | ICD-10-CM

## 2012-10-17 LAB — LIPID PANEL
Cholesterol: 109 mg/dL (ref 0–200)
HDL: 39.8 mg/dL (ref >39.00)
VLDL: 10.8 mg/dL (ref 0.0–40.0)

## 2012-10-17 LAB — COMPREHENSIVE METABOLIC PANEL
Albumin: 3.6 g/dL (ref 3.5–5.2)
Alkaline Phosphatase: 71 U/L (ref 39–117)
BUN: 23 mg/dL (ref 6–23)
Glucose, Bld: 124 mg/dL — ABNORMAL HIGH (ref 70–99)
Potassium: 4.2 mEq/L (ref 3.5–5.1)
Total Bilirubin: 1 mg/dL (ref 0.3–1.2)

## 2012-10-17 LAB — HEMOGLOBIN A1C: Hgb A1c MFr Bld: 6.8 % — ABNORMAL HIGH (ref 4.6–6.5)

## 2012-10-23 ENCOUNTER — Telehealth: Payer: Self-pay | Admitting: Family Medicine

## 2012-10-23 NOTE — Telephone Encounter (Signed)
Called pt.  I am currently sick and will need to cancel clinic tomorrow.  I called pt to reschedule.  Pt agreed.   

## 2012-10-24 ENCOUNTER — Encounter: Payer: Medicare Other | Admitting: Family Medicine

## 2012-10-30 ENCOUNTER — Encounter: Payer: Self-pay | Admitting: Family Medicine

## 2012-10-30 ENCOUNTER — Ambulatory Visit (INDEPENDENT_AMBULATORY_CARE_PROVIDER_SITE_OTHER)
Admission: RE | Admit: 2012-10-30 | Discharge: 2012-10-30 | Disposition: A | Discharge status: Home / Self Care | Payer: Medicare Other | Source: Ambulatory Visit | Attending: Family Medicine | Admitting: Family Medicine

## 2012-10-30 ENCOUNTER — Ambulatory Visit (INDEPENDENT_AMBULATORY_CARE_PROVIDER_SITE_OTHER): Payer: Medicare Other | Admitting: Family Medicine

## 2012-10-30 VITALS — BP 126/70 | HR 89 | Temp 98.5°F | Wt 237.0 lb

## 2012-10-30 DIAGNOSIS — Z8521 Personal history of malignant neoplasm of larynx: Secondary | ICD-10-CM

## 2012-10-30 DIAGNOSIS — E11319 Type 2 diabetes mellitus with unspecified diabetic retinopathy without macular edema: Secondary | ICD-10-CM

## 2012-10-30 DIAGNOSIS — E785 Hyperlipidemia, unspecified: Secondary | ICD-10-CM

## 2012-10-30 DIAGNOSIS — E1139 Type 2 diabetes mellitus with other diabetic ophthalmic complication: Secondary | ICD-10-CM

## 2012-10-30 DIAGNOSIS — C329 Malignant neoplasm of larynx, unspecified: Secondary | ICD-10-CM

## 2012-10-30 DIAGNOSIS — Z1211 Encounter for screening for malignant neoplasm of colon: Secondary | ICD-10-CM

## 2012-10-30 DIAGNOSIS — Z0000 Encounter for general adult medical examination without abnormal findings: Secondary | ICD-10-CM

## 2012-10-30 DIAGNOSIS — E119 Type 2 diabetes mellitus without complications: Secondary | ICD-10-CM

## 2012-10-30 DIAGNOSIS — I10 Essential (primary) hypertension: Secondary | ICD-10-CM

## 2012-10-30 MED ORDER — METFORMIN HCL 500 MG PO TABS: 500.0000 mg | ORAL_TABLET | Freq: Two times a day (BID) | ORAL | Status: DC

## 2012-10-30 MED ORDER — SIMVASTATIN 40 MG PO TABS: 40.0000 mg | ORAL_TABLET | Freq: Every day | ORAL | Status: DC

## 2012-10-30 MED ORDER — HYDROCHLOROTHIAZIDE 12.5 MG PO TABS: 12.5000 mg | ORAL_TABLET | Freq: Every day | ORAL | Status: DC

## 2012-10-30 MED ORDER — PIOGLITAZONE HCL 45 MG PO TABS: 45.0000 mg | ORAL_TABLET | Freq: Every day | ORAL | Status: DC

## 2012-10-30 MED ORDER — RAMIPRIL 5 MG PO CAPS: 5.0000 mg | ORAL_CAPSULE | Freq: Every day | ORAL | Status: DC

## 2012-10-30 NOTE — Progress Notes (Signed)
CPE- See plan.  Routine anticipatory guidance given to patient.  See health maintenance. Tetanus 2007 Shingles 2009 PNA 2010 Flu 2013 D/w patient [id number] for colon cancer screening, including IFOB vs. colonoscopy.  Risks and benefits of both were discussed and patient voiced understanding.  Pt elects for: IFOB.  PSA declined by patient.  D/w pt about recs for age >41.  Living will discussed with wife prev.  She would be designated if incapacitated.    Diabetes:  Using medications without difficulties:yes Hypoglycemic episodes:no Hyperglycemic episodes:no Feet problems:no Blood Sugars averaging: ~100 eye exam within last year: yes, summer 2013  Hypertension:    Using medication without problems or lightheadedness: yes Chest pain with exertion:no Edema:no Short of breath:no  Elevated Cholesterol: Using medications without problems:yes Muscle aches: no Diet compliance:yes Exercise:yes, farming  H/o laryngectomy and due for f/u CXR.    H/o AF.  No palpitations, no CP.  On ASA 325mg  a day.   PMH and SH reviewed.   Vital signs, Meds and allergies reviewed.  ROS: See HPI.  Otherwise nontributory.   GEN: nad, alert and oriented HEENT: mucous membranes moist, neck supple with chronic changes on L side.  Trach intact NECK: supple w/o LA CV: he sounds to be regular with occ ectopy PULM: ctab, no inc wob ABD: soft, +bs EXT: no edema SKIN: no acute rash  Diabetic foot exam: Normal inspection No skin breakdown Calluses noted on L 1st toe Normal DP pulses Normal sensation to light tough and monofilament Nails thickened

## 2012-10-30 NOTE — Patient Instructions (Addendum)
Go to the lab on the way out (xray and stool cards).  We'll contact you with your reports.  Recheck A1c in 6 months before a visit.  Don't change your meds.  Take care.  Glad to see you.

## 2012-10-31 DIAGNOSIS — Z0000 Encounter for general adult medical examination without abnormal findings: Secondary | ICD-10-CM | POA: Insufficient documentation

## 2012-10-31 NOTE — Assessment & Plan Note (Signed)
Controlled, continue current meds, diet, exercise.  Labs discussed.

## 2012-10-31 NOTE — Assessment & Plan Note (Signed)
No new oral lesions.  No new sx.  See CXR report.

## 2012-10-31 NOTE — Assessment & Plan Note (Signed)
Controlled, continue current meds, diet, exercise.  Labs discussed.   

## 2012-10-31 NOTE — Assessment & Plan Note (Signed)
Routine anticipatory guidance given to patient.  See health maintenance. Tetanus 2007 Shingles 2009 PNA 2010 Flu 2013 D/w patient [id number] for colon cancer screening, including IFOB vs. colonoscopy.  Risks and benefits of both were discussed and patient voiced understanding.  Pt elects for: IFOB.  PSA declined by patient.   Living will discussed with wife prev.  She would be designated if incapacitated.

## 2012-11-14 ENCOUNTER — Other Ambulatory Visit (INDEPENDENT_AMBULATORY_CARE_PROVIDER_SITE_OTHER): Payer: Medicare Other

## 2012-11-14 DIAGNOSIS — Z1211 Encounter for screening for malignant neoplasm of colon: Secondary | ICD-10-CM

## 2012-11-17 ENCOUNTER — Encounter: Payer: Self-pay | Admitting: *Deleted

## 2013-06-04 ENCOUNTER — Other Ambulatory Visit: Payer: Self-pay

## 2013-06-15 ENCOUNTER — Other Ambulatory Visit (INDEPENDENT_AMBULATORY_CARE_PROVIDER_SITE_OTHER): Payer: Medicare Other

## 2013-06-15 DIAGNOSIS — E119 Type 2 diabetes mellitus without complications: Secondary | ICD-10-CM

## 2013-06-15 LAB — HEMOGLOBIN A1C: Hgb A1c MFr Bld: 6.9 % — ABNORMAL HIGH (ref 4.6–6.5)

## 2013-06-16 ENCOUNTER — Encounter: Payer: Self-pay | Admitting: *Deleted

## 2013-09-02 ENCOUNTER — Encounter: Payer: Self-pay | Admitting: Family Medicine

## 2013-09-02 ENCOUNTER — Ambulatory Visit (INDEPENDENT_AMBULATORY_CARE_PROVIDER_SITE_OTHER): Payer: Medicare Other

## 2013-09-02 ENCOUNTER — Ambulatory Visit (INDEPENDENT_AMBULATORY_CARE_PROVIDER_SITE_OTHER): Payer: Medicare Other | Admitting: Family Medicine

## 2013-09-02 VITALS — BP 128/80 | HR 89 | Temp 98.2°F | Wt 233.0 lb

## 2013-09-02 DIAGNOSIS — R220 Localized swelling, mass and lump, head: Secondary | ICD-10-CM

## 2013-09-02 DIAGNOSIS — Z23 Encounter for immunization: Secondary | ICD-10-CM

## 2013-09-02 DIAGNOSIS — R221 Localized swelling, mass and lump, neck: Secondary | ICD-10-CM

## 2013-09-02 NOTE — Patient Instructions (Signed)
Go see Shirlee Limerick about getting the ultrasound set up.  We'll go from there.  Take care.

## 2013-09-03 DIAGNOSIS — R221 Localized swelling, mass and lump, neck: Secondary | ICD-10-CM | POA: Insufficient documentation

## 2013-09-03 NOTE — Assessment & Plan Note (Signed)
I don't think he has a true bulge from the carotid.  I wonder if he has gradually lost tissue that overlies this, making it more obvious.  Would check carotid u/s for now and we'll go from there.  He agrees.

## 2013-09-03 NOTE — Progress Notes (Signed)
H/o laryngeal CA s/p surgery many years ago, now noting a knot on L side of neck.  Not ttp.  Not red, no trauma.  Asking for options.  He is unclear about duration. I don't remember seeing the lesion prev.   Meds, vitals, and allergies reviewed.   ROS: See HPI.  Otherwise, noncontributory.  nad ncat Mmm Neck supple, chronic postsurgical changes noted with stoma CDI.  L side of neck with pulse noted at the carotid- this corresponds to the lesion in question.

## 2013-09-04 ENCOUNTER — Ambulatory Visit (HOSPITAL_COMMUNITY): Payer: Medicare Other | Attending: Family Medicine

## 2013-09-04 DIAGNOSIS — R209 Unspecified disturbances of skin sensation: Secondary | ICD-10-CM | POA: Insufficient documentation

## 2013-09-04 DIAGNOSIS — R221 Localized swelling, mass and lump, neck: Secondary | ICD-10-CM

## 2013-09-04 DIAGNOSIS — I658 Occlusion and stenosis of other precerebral arteries: Secondary | ICD-10-CM | POA: Insufficient documentation

## 2013-09-04 DIAGNOSIS — Z8521 Personal history of malignant neoplasm of larynx: Secondary | ICD-10-CM | POA: Insufficient documentation

## 2013-09-04 DIAGNOSIS — I10 Essential (primary) hypertension: Secondary | ICD-10-CM | POA: Insufficient documentation

## 2013-09-04 DIAGNOSIS — E785 Hyperlipidemia, unspecified: Secondary | ICD-10-CM | POA: Insufficient documentation

## 2013-09-04 DIAGNOSIS — I6529 Occlusion and stenosis of unspecified carotid artery: Secondary | ICD-10-CM | POA: Insufficient documentation

## 2013-09-04 DIAGNOSIS — E119 Type 2 diabetes mellitus without complications: Secondary | ICD-10-CM | POA: Insufficient documentation

## 2013-10-20 ENCOUNTER — Other Ambulatory Visit: Payer: Self-pay

## 2013-10-20 MED ORDER — PIOGLITAZONE HCL 45 MG PO TABS: 45.0000 mg | ORAL_TABLET | Freq: Every day | ORAL | Status: DC

## 2013-10-20 MED ORDER — HYDROCHLOROTHIAZIDE 12.5 MG PO TABS: 12.5000 mg | ORAL_TABLET | Freq: Every day | ORAL | Status: DC

## 2013-10-20 MED ORDER — SIMVASTATIN 40 MG PO TABS: 40.0000 mg | ORAL_TABLET | Freq: Every day | ORAL | Status: DC

## 2013-10-20 NOTE — Telephone Encounter (Signed)
pts wife request 3 month rx to [facility] until pt seen CPX on 12/17/13. Advised done.

## 2013-10-23 ENCOUNTER — Other Ambulatory Visit: Payer: Self-pay | Admitting: *Deleted

## 2013-10-23 MED ORDER — SIMVASTATIN 40 MG PO TABS: 40.0000 mg | ORAL_TABLET | Freq: Every day | ORAL | Status: DC

## 2013-10-23 MED ORDER — HYDROCHLOROTHIAZIDE 12.5 MG PO TABS: 12.5000 mg | ORAL_TABLET | Freq: Every day | ORAL | Status: DC

## 2013-10-23 MED ORDER — PIOGLITAZONE HCL 45 MG PO TABS: 45.0000 mg | ORAL_TABLET | Freq: Every day | ORAL | Status: DC

## 2013-12-09 ENCOUNTER — Telehealth: Payer: Self-pay | Admitting: Family Medicine

## 2013-12-09 DIAGNOSIS — E11319 Type 2 diabetes mellitus with unspecified diabetic retinopathy without macular edema: Secondary | ICD-10-CM

## 2013-12-09 DIAGNOSIS — E785 Hyperlipidemia, unspecified: Secondary | ICD-10-CM

## 2013-12-09 DIAGNOSIS — I10 Essential (primary) hypertension: Secondary | ICD-10-CM

## 2013-12-09 NOTE — Telephone Encounter (Signed)
Message copied by Abner Greenspan on Wed Dec 09, 2013  7:16 PM ------      Message from: Ellamae Sia      Created: Tue Dec 08, 2013  5:13 PM      Regarding: Lab orders for Thursday, 1.15.15       Patient is scheduled for CPX labs, please order future labs, Thanks , Terri        [facility] pt ------

## 2013-12-10 ENCOUNTER — Other Ambulatory Visit (INDEPENDENT_AMBULATORY_CARE_PROVIDER_SITE_OTHER): Payer: Medicare Other

## 2013-12-10 DIAGNOSIS — Z0000 Encounter for general adult medical examination without abnormal findings: Secondary | ICD-10-CM

## 2013-12-10 DIAGNOSIS — E11319 Type 2 diabetes mellitus with unspecified diabetic retinopathy without macular edema: Secondary | ICD-10-CM

## 2013-12-10 DIAGNOSIS — E1139 Type 2 diabetes mellitus with other diabetic ophthalmic complication: Secondary | ICD-10-CM

## 2013-12-10 DIAGNOSIS — I10 Essential (primary) hypertension: Secondary | ICD-10-CM

## 2013-12-10 DIAGNOSIS — E785 Hyperlipidemia, unspecified: Secondary | ICD-10-CM

## 2013-12-10 LAB — COMPREHENSIVE METABOLIC PANEL WITH GFR
ALT: 16 U/L (ref 0–53)
AST: 31 U/L (ref 0–37)
Albumin: 3.6 g/dL (ref 3.5–5.2)
Alkaline Phosphatase: 73 U/L (ref 39–117)
BUN: 20 mg/dL (ref 6–23)
CO2: 28 meq/L (ref 19–32)
Calcium: 9.4 mg/dL (ref 8.4–10.5)
Chloride: 101 meq/L (ref 96–112)
Creatinine, Ser: 1.2 mg/dL (ref 0.4–1.5)
GFR: 63.79 mL/min
Glucose, Bld: 99 mg/dL (ref 70–99)
Potassium: 4.3 meq/L (ref 3.5–5.1)
Sodium: 136 meq/L (ref 135–145)
Total Bilirubin: 1.1 mg/dL (ref 0.3–1.2)
Total Protein: 7.4 g/dL (ref 6.0–8.3)

## 2013-12-10 LAB — CBC WITH DIFFERENTIAL/PLATELET
Basophils Absolute: 0 10*3/uL (ref 0.0–0.1)
Basophils Relative: 0.4 % (ref 0.0–3.0)
EOS ABS: 0.1 10*3/uL (ref 0.0–0.7)
Eosinophils Relative: 1.5 % (ref 0.0–5.0)
HEMATOCRIT: 46.9 % (ref 39.0–52.0)
Hemoglobin: 16 g/dL (ref 13.0–17.0)
Lymphocytes Relative: 23.5 % (ref 12.0–46.0)
Lymphs Abs: 2 10*3/uL (ref 0.7–4.0)
MCHC: 34.1 g/dL (ref 30.0–36.0)
MCV: 103.1 fl — AB (ref 78.0–100.0)
MONO ABS: 0.7 10*3/uL (ref 0.1–1.0)
Monocytes Relative: 8.8 % (ref 3.0–12.0)
NEUTROS PCT: 65.8 % (ref 43.0–77.0)
Neutro Abs: 5.6 10*3/uL (ref 1.4–7.7)
Platelets: 192 10*3/uL (ref 150.0–400.0)
RBC: 4.55 Mil/uL (ref 4.22–5.81)
RDW: 13.1 % (ref 11.5–14.6)
WBC: 8.5 10*3/uL (ref 4.5–10.5)

## 2013-12-10 LAB — LIPID PANEL
Cholesterol: 102 mg/dL (ref 0–200)
HDL: 44.7 mg/dL
LDL Cholesterol: 45 mg/dL (ref 0–99)
Total CHOL/HDL Ratio: 2
Triglycerides: 61 mg/dL (ref 0.0–149.0)
VLDL: 12.2 mg/dL (ref 0.0–40.0)

## 2013-12-10 LAB — TSH: TSH: 0.82 u[IU]/mL (ref 0.35–5.50)

## 2013-12-10 LAB — HEMOGLOBIN A1C: Hgb A1c MFr Bld: 6.5 % (ref 4.6–6.5)

## 2013-12-17 ENCOUNTER — Ambulatory Visit (INDEPENDENT_AMBULATORY_CARE_PROVIDER_SITE_OTHER): Payer: Medicare Other | Admitting: Family Medicine

## 2013-12-17 ENCOUNTER — Encounter: Payer: Self-pay | Admitting: Family Medicine

## 2013-12-17 ENCOUNTER — Ambulatory Visit (INDEPENDENT_AMBULATORY_CARE_PROVIDER_SITE_OTHER)
Admission: RE | Admit: 2013-12-17 | Discharge: 2013-12-17 | Disposition: A | Discharge status: Home / Self Care | Payer: Medicare Other | Source: Ambulatory Visit | Attending: Family Medicine | Admitting: Family Medicine

## 2013-12-17 VITALS — BP 142/76 | HR 82 | Temp 98.1°F | Ht 76.0 in | Wt 234.8 lb

## 2013-12-17 DIAGNOSIS — I4891 Unspecified atrial fibrillation: Secondary | ICD-10-CM

## 2013-12-17 DIAGNOSIS — I10 Essential (primary) hypertension: Secondary | ICD-10-CM

## 2013-12-17 DIAGNOSIS — E1139 Type 2 diabetes mellitus with other diabetic ophthalmic complication: Secondary | ICD-10-CM

## 2013-12-17 DIAGNOSIS — C329 Malignant neoplasm of larynx, unspecified: Secondary | ICD-10-CM

## 2013-12-17 DIAGNOSIS — E785 Hyperlipidemia, unspecified: Secondary | ICD-10-CM

## 2013-12-17 DIAGNOSIS — Z9889 Other specified postprocedural states: Secondary | ICD-10-CM

## 2013-12-17 DIAGNOSIS — Z9002 Acquired absence of larynx: Secondary | ICD-10-CM

## 2013-12-17 DIAGNOSIS — E11319 Type 2 diabetes mellitus with unspecified diabetic retinopathy without macular edema: Secondary | ICD-10-CM

## 2013-12-17 MED ORDER — SIMVASTATIN 40 MG PO TABS: 40.0000 mg | ORAL_TABLET | Freq: Every day | ORAL | Status: DC

## 2013-12-17 MED ORDER — PIOGLITAZONE HCL 45 MG PO TABS: 45.0000 mg | ORAL_TABLET | Freq: Every day | ORAL | Status: DC

## 2013-12-17 MED ORDER — RAMIPRIL 5 MG PO CAPS: 5.0000 mg | ORAL_CAPSULE | Freq: Every day | ORAL | Status: DC

## 2013-12-17 MED ORDER — METFORMIN HCL 500 MG PO TABS: 500.0000 mg | ORAL_TABLET | Freq: Two times a day (BID) | ORAL | Status: DC

## 2013-12-17 MED ORDER — HYDROCHLOROTHIAZIDE 12.5 MG PO TABS: 12.5000 mg | ORAL_TABLET | Freq: Every day | ORAL | Status: DC

## 2013-12-17 NOTE — Assessment & Plan Note (Signed)
Reasonable control, continue current meds.  Labs d/w pt.  

## 2013-12-17 NOTE — Assessment & Plan Note (Signed)
Declined anticoagulation other than ASA.  Continue as is. He feels well overall.

## 2013-12-17 NOTE — Progress Notes (Signed)
Pre-visit discussion using our clinic review tool. No additional management support is needed unless otherwise documented below in the visit note.  Diabetes:  Using medications without difficulties:yes Hypoglycemic episodes:no Hyperglycemic episodes:no Feet problems:see below Blood Sugars averaging: 80-120 eye exam within last year:yes  Hypertension:    Using medication without problems or lightheadedness: yes Chest pain with exertion:no Edema:no Short of breath:no  Elevated Cholesterol: Using medications without problems:yes Muscle aches: no Diet compliance:yes Exercise:yes  AF.  No CP.  He declines anticoagulation.  No acute changes per patient.   PMH and SH reviewed.   Vital signs, Meds and allergies reviewed.  ROS: See HPI.  Otherwise nontributory.   GEN: nad, alert and oriented HEENT: mucous membranes moist NECK: supple w/o LA, trach site CDI CV: IRR, not tachy PULM: ctab, no inc wob ABD: soft, +bs EXT: no edema SKIN: no acute rash Finger amputations noted.   Diabetic foot exam: No skin breakdown but blister noted on R 3rd toe, clean and not infected No calluses  Normal DP pulses Normal sensation to light tough and monofilament Nails thickened

## 2013-12-17 NOTE — Assessment & Plan Note (Signed)
Repeat CXR today, given the trach.  See notes on CXR.

## 2013-12-17 NOTE — Assessment & Plan Note (Signed)
Reasonable control, continue current meds.  Labs d/w pt.

## 2013-12-17 NOTE — Patient Instructions (Signed)
Go to the lab on the way out.  We'll contact you with your xray report. Take care.  Don't change your meds.  Let's get back together in about 6 months, A1c ahead of time.  Glad to see you.

## 2013-12-21 ENCOUNTER — Other Ambulatory Visit: Payer: Self-pay | Admitting: *Deleted

## 2014-06-18 ENCOUNTER — Ambulatory Visit (INDEPENDENT_AMBULATORY_CARE_PROVIDER_SITE_OTHER): Payer: Medicare Other | Admitting: Family Medicine

## 2014-06-18 ENCOUNTER — Encounter: Payer: Self-pay | Admitting: Family Medicine

## 2014-06-18 VITALS — BP 134/72 | HR 87 | Temp 98.1°F | Wt 231.5 lb

## 2014-06-18 DIAGNOSIS — E11359 Type 2 diabetes mellitus with proliferative diabetic retinopathy without macular edema: Secondary | ICD-10-CM

## 2014-06-18 DIAGNOSIS — E113599 Type 2 diabetes mellitus with proliferative diabetic retinopathy without macular edema, unspecified eye: Secondary | ICD-10-CM

## 2014-06-18 DIAGNOSIS — E1139 Type 2 diabetes mellitus with other diabetic ophthalmic complication: Secondary | ICD-10-CM

## 2014-06-18 DIAGNOSIS — E119 Type 2 diabetes mellitus without complications: Secondary | ICD-10-CM

## 2014-06-18 LAB — HEMOGLOBIN A1C: HEMOGLOBIN A1C: 6.5 % (ref 4.6–6.5)

## 2014-06-18 NOTE — Progress Notes (Signed)
Pre visit review using our clinic review tool, if applicable. No additional management support is needed unless otherwise documented below in the visit note.  Diabetes:  Using medications without difficulties:yes Hypoglycemic episodes:no Hyperglycemic episodes:no Feet problems:no Blood Sugars averaging: ~100 eye exam within last year: yes Due for A1c today.    Meds, vitals, and allergies reviewed.   ROS: See HPI.  Otherwise negative.    GEN: nad, alert and oriented HEENT: mucous membranes moist, laryngectomy noted.   NECK: supple w/o LA CV: IRR, no tachy PULM: ctab, no inc wob ABD: soft, +bs EXT: trace BLE edema SKIN: no acute rash

## 2014-06-18 NOTE — Patient Instructions (Signed)
Take care. Glad to see you. Go to the lab on the way out.  We'll contact you with your lab report. Recheck in about 6 months at a physical.

## 2014-06-20 NOTE — Assessment & Plan Note (Signed)
A1c controlled, continue as is. See notes on labs.  Doing well.

## 2014-06-21 ENCOUNTER — Encounter: Payer: Self-pay | Admitting: *Deleted

## 2014-08-18 ENCOUNTER — Ambulatory Visit (INDEPENDENT_AMBULATORY_CARE_PROVIDER_SITE_OTHER): Payer: Medicare Other

## 2014-08-18 DIAGNOSIS — Z23 Encounter for immunization: Secondary | ICD-10-CM

## 2014-09-06 ENCOUNTER — Other Ambulatory Visit: Payer: Self-pay | Admitting: Family Medicine

## 2014-09-06 ENCOUNTER — Other Ambulatory Visit (HOSPITAL_COMMUNITY): Payer: Self-pay | Admitting: Cardiology

## 2014-09-06 DIAGNOSIS — R221 Localized swelling, mass and lump, neck: Secondary | ICD-10-CM

## 2014-09-06 DIAGNOSIS — I6523 Occlusion and stenosis of bilateral carotid arteries: Secondary | ICD-10-CM

## 2014-09-06 NOTE — Progress Notes (Signed)
Left message on answering machine to call back.

## 2014-09-06 NOTE — Progress Notes (Signed)
Call pt.  Due for recheck carotid u/s, 1 year f/u from last year.  I put in the order. Thanks.

## 2014-09-07 ENCOUNTER — Telehealth: Payer: Self-pay | Admitting: Family Medicine

## 2014-09-07 NOTE — Telephone Encounter (Signed)
Returned patient's wife call and confirmed that patient already has his appointment scheduled for a repeat test.

## 2014-09-07 NOTE — Progress Notes (Signed)
Spoke to patient's wife and was advised that he already has an appointment scheduled for the recheck Friday.

## 2014-09-07 NOTE — Progress Notes (Signed)
Left message on answering machine to call back.

## 2014-09-07 NOTE — Telephone Encounter (Signed)
Patient's wife returned your call.  You can call her back before 12:00 or after 3:00.

## 2014-09-10 ENCOUNTER — Ambulatory Visit (HOSPITAL_COMMUNITY): Payer: Medicare Other | Attending: Family Medicine | Admitting: *Deleted

## 2014-09-10 DIAGNOSIS — I6523 Occlusion and stenosis of bilateral carotid arteries: Secondary | ICD-10-CM

## 2014-09-10 DIAGNOSIS — I10 Essential (primary) hypertension: Secondary | ICD-10-CM | POA: Insufficient documentation

## 2014-09-10 DIAGNOSIS — I6529 Occlusion and stenosis of unspecified carotid artery: Secondary | ICD-10-CM | POA: Insufficient documentation

## 2014-09-10 DIAGNOSIS — E785 Hyperlipidemia, unspecified: Secondary | ICD-10-CM | POA: Diagnosis not present

## 2014-09-10 DIAGNOSIS — E119 Type 2 diabetes mellitus without complications: Secondary | ICD-10-CM | POA: Insufficient documentation

## 2014-09-10 NOTE — Progress Notes (Signed)
Carotid Duplex Performed 

## 2014-10-26 LAB — HM DIABETES EYE EXAM

## 2014-12-12 ENCOUNTER — Other Ambulatory Visit: Payer: Self-pay | Admitting: Family Medicine

## 2014-12-12 DIAGNOSIS — E113599 Type 2 diabetes mellitus with proliferative diabetic retinopathy without macular edema, unspecified eye: Secondary | ICD-10-CM

## 2014-12-12 DIAGNOSIS — I4891 Unspecified atrial fibrillation: Secondary | ICD-10-CM

## 2014-12-13 ENCOUNTER — Other Ambulatory Visit (INDEPENDENT_AMBULATORY_CARE_PROVIDER_SITE_OTHER): Payer: Medicare Other

## 2014-12-13 DIAGNOSIS — I4891 Unspecified atrial fibrillation: Secondary | ICD-10-CM

## 2014-12-13 DIAGNOSIS — E113599 Type 2 diabetes mellitus with proliferative diabetic retinopathy without macular edema, unspecified eye: Secondary | ICD-10-CM

## 2014-12-13 DIAGNOSIS — E11359 Type 2 diabetes mellitus with proliferative diabetic retinopathy without macular edema: Secondary | ICD-10-CM

## 2014-12-13 LAB — CBC WITH DIFFERENTIAL/PLATELET
BASOS ABS: 0.1 10*3/uL (ref 0.0–0.1)
Basophils Relative: 0.7 % (ref 0.0–3.0)
EOS ABS: 0.1 10*3/uL (ref 0.0–0.7)
Eosinophils Relative: 1.8 % (ref 0.0–5.0)
HCT: 48.7 % (ref 39.0–52.0)
Hemoglobin: 15.9 g/dL (ref 13.0–17.0)
Lymphocytes Relative: 21.6 % (ref 12.0–46.0)
Lymphs Abs: 1.8 10*3/uL (ref 0.7–4.0)
MCHC: 32.6 g/dL (ref 30.0–36.0)
MCV: 106.4 fl — AB (ref 78.0–100.0)
MONO ABS: 0.8 10*3/uL (ref 0.1–1.0)
MONOS PCT: 9.2 % (ref 3.0–12.0)
NEUTROS PCT: 66.7 % (ref 43.0–77.0)
Neutro Abs: 5.5 10*3/uL (ref 1.4–7.7)
PLATELETS: 193 10*3/uL (ref 150.0–400.0)
RBC: 4.57 Mil/uL (ref 4.22–5.81)
RDW: 12.8 % (ref 11.5–15.5)
WBC: 8.3 10*3/uL (ref 4.0–10.5)

## 2014-12-13 LAB — COMPREHENSIVE METABOLIC PANEL
ALK PHOS: 79 U/L (ref 39–117)
ALT: 16 U/L (ref 0–53)
AST: 6 U/L (ref 0–37)
Albumin: 3.8 g/dL (ref 3.5–5.2)
BUN: 25 mg/dL — AB (ref 6–23)
CO2: 28 meq/L (ref 19–32)
CREATININE: 1.11 mg/dL (ref 0.40–1.50)
Calcium: 10.1 mg/dL (ref 8.4–10.5)
Chloride: 102 mEq/L (ref 96–112)
GFR: 67.61 mL/min (ref >60.00)
Glucose, Bld: 119 mg/dL — ABNORMAL HIGH (ref 70–99)
POTASSIUM: 4.9 meq/L (ref 3.5–5.1)
Sodium: 137 mEq/L (ref 135–145)
TOTAL PROTEIN: 7.5 g/dL (ref 6.0–8.3)
Total Bilirubin: 1 mg/dL (ref 0.2–1.2)

## 2014-12-13 LAB — TSH: TSH: 0.85 u[IU]/mL (ref 0.35–4.50)

## 2014-12-13 LAB — LIPID PANEL
CHOLESTEROL: 103 mg/dL (ref 0–200)
HDL: 47.6 mg/dL (ref >39.00)
LDL Cholesterol: 42 mg/dL (ref 0–99)
NONHDL: 55.4
Total CHOL/HDL Ratio: 2
Triglycerides: 67 mg/dL (ref 0.0–149.0)
VLDL: 13.4 mg/dL (ref 0.0–40.0)

## 2014-12-13 LAB — HEMOGLOBIN A1C: HEMOGLOBIN A1C: 6.7 % — AB (ref 4.6–6.5)

## 2014-12-20 ENCOUNTER — Ambulatory Visit (INDEPENDENT_AMBULATORY_CARE_PROVIDER_SITE_OTHER): Payer: Medicare Other | Admitting: Family Medicine

## 2014-12-20 ENCOUNTER — Encounter: Payer: Self-pay | Admitting: Family Medicine

## 2014-12-20 ENCOUNTER — Ambulatory Visit (INDEPENDENT_AMBULATORY_CARE_PROVIDER_SITE_OTHER)
Admission: RE | Admit: 2014-12-20 | Discharge: 2014-12-20 | Disposition: A | Discharge status: Home / Self Care | Payer: Medicare Other | Source: Ambulatory Visit | Attending: Family Medicine | Admitting: Family Medicine

## 2014-12-20 VITALS — BP 122/74 | HR 78 | Temp 98.3°F | Ht 76.0 in | Wt 233.8 lb

## 2014-12-20 DIAGNOSIS — E785 Hyperlipidemia, unspecified: Secondary | ICD-10-CM

## 2014-12-20 DIAGNOSIS — Z8521 Personal history of malignant neoplasm of larynx: Secondary | ICD-10-CM

## 2014-12-20 DIAGNOSIS — I10 Essential (primary) hypertension: Secondary | ICD-10-CM

## 2014-12-20 DIAGNOSIS — Z0000 Encounter for general adult medical examination without abnormal findings: Secondary | ICD-10-CM

## 2014-12-20 DIAGNOSIS — I4891 Unspecified atrial fibrillation: Secondary | ICD-10-CM

## 2014-12-20 DIAGNOSIS — C329 Malignant neoplasm of larynx, unspecified: Secondary | ICD-10-CM

## 2014-12-20 DIAGNOSIS — Z23 Encounter for immunization: Secondary | ICD-10-CM

## 2014-12-20 DIAGNOSIS — E113599 Type 2 diabetes mellitus with proliferative diabetic retinopathy without macular edema, unspecified eye: Secondary | ICD-10-CM

## 2014-12-20 DIAGNOSIS — E11359 Type 2 diabetes mellitus with proliferative diabetic retinopathy without macular edema: Secondary | ICD-10-CM

## 2014-12-20 MED ORDER — RAMIPRIL 5 MG PO CAPS: 5.0000 mg | ORAL_CAPSULE | Freq: Every day | ORAL | Status: DC

## 2014-12-20 MED ORDER — HYDROCHLOROTHIAZIDE 12.5 MG PO TABS: 12.5000 mg | ORAL_TABLET | Freq: Every day | ORAL | Status: DC

## 2014-12-20 MED ORDER — METFORMIN HCL 500 MG PO TABS: 500.0000 mg | ORAL_TABLET | Freq: Two times a day (BID) | ORAL | Status: DC

## 2014-12-20 MED ORDER — PIOGLITAZONE HCL 45 MG PO TABS: 45.0000 mg | ORAL_TABLET | Freq: Every day | ORAL | Status: DC

## 2014-12-20 MED ORDER — SIMVASTATIN 40 MG PO TABS: 40.0000 mg | ORAL_TABLET | Freq: Every day | ORAL | Status: DC

## 2014-12-20 NOTE — Progress Notes (Signed)
Pre visit review using our clinic review tool, if applicable. No additional management support is needed unless otherwise documented below in the visit note.  I have personally reviewed the Medicare Annual Wellness questionnaire and have noted 1. The patient's medical and social history 2. Their use of alcohol, tobacco or illicit drugs 3. Their current medications and supplements 4. The patient's functional ability including ADL's, fall risks, home safety risks and hearing or visual             impairment. 5. Diet and physical activities 6. Evidence for depression or mood disorders  The patients weight, height, BMI have been recorded in the chart and visual acuity is per eye clinic.  I have made referrals, counseling and provided education to the patient based review of the above and I have provided the pt with a written personalized care plan for preventive services.  Provider list updated- see scanned forms.  Routine anticipatory guidance given to patient.  See health maintenance.  Flu 2015 Shingles 2009 PNA 2010 Tetanus 2007 Colon cancer screening declined due to age.  Prostate cancer screening declined by patient.  This is reasonable.  Advance directive- wife designated if patient were incapacitated. Cognitive function addressed- see scanned forms- and if abnormal then additional documentation follows.  Eye exam done 12/15 per patient.   Hypertension:    Using medication without problems or lightheadedness: yes Chest pain with exertion:no Edema:no Short of breath:no Not interested in anticoagulation.  D/w pt.   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  Due for f/u yearly CXR.  Good tracheostomy maintenance by patient.   PMH and SH reviewed  Meds, vitals, and allergies reviewed.   ROS: See HPI.  Otherwise negative.    GEN: nad, alert and oriented HEENT: mucous membranes moist, OP wnl NECK: supple w/o LA, tracheostomy intact CV: IRR, not tachy PULM: ctab, no inc wob ABD: soft, +bs EXT: no edema SKIN: no acute rash but he has a chronically irritated lesion on the forehead (he has f/u with derm next week already scheduled)  Diabetic foot exam: Normal inspection except for diffuse small varicose veins B No skin breakdown No calluses  Normal DP pulses Normal sensation to light touch and monofilament Nails thickened on the B 1st toes

## 2014-12-20 NOTE — Patient Instructions (Signed)
Go to the lab on the way out.  We'll contact you with your xray report. Take care.  Glad to see you.  Recheck in about 1 year at a physical, sooner if needed.

## 2014-12-21 NOTE — Assessment & Plan Note (Signed)
Declines anticoagulation. D/w pt.

## 2014-12-21 NOTE — Assessment & Plan Note (Signed)
No changes on exam, see f/u cxr.  Images reviewed.

## 2014-12-21 NOTE — Assessment & Plan Note (Signed)
A1c at goal, no change in meds.  D/w pt. Continue as is.

## 2014-12-21 NOTE — Assessment & Plan Note (Signed)
Flu 2015 Shingles 2009 PNA 2010 Tetanus 2007 Colon cancer screening declined due to age.  Prostate cancer screening declined by patient.  This is reasonable.  Advance directive- wife designated if patient were incapacitated. Cognitive function addressed- see scanned forms- and if abnormal then additional documentation follows.  Eye exam done 12/15 per patient.

## 2014-12-21 NOTE — Assessment & Plan Note (Signed)
Controlled, no ADE on statin, labs d/w pt.  Continue exercise on the farm. Doing well overall.

## 2014-12-21 NOTE — Assessment & Plan Note (Signed)
Controlled, not tachy, labs d/w pt.  Continue as is.  He agrees.

## 2015-02-09 ENCOUNTER — Encounter: Payer: Self-pay | Admitting: Family Medicine

## 2015-02-09 ENCOUNTER — Ambulatory Visit (INDEPENDENT_AMBULATORY_CARE_PROVIDER_SITE_OTHER): Payer: Medicare Other | Admitting: Family Medicine

## 2015-02-09 VITALS — BP 108/64 | HR 88 | Temp 98.6°F | Wt 237.0 lb

## 2015-02-09 DIAGNOSIS — H6122 Impacted cerumen, left ear: Secondary | ICD-10-CM

## 2015-02-09 NOTE — Progress Notes (Signed)
Pre visit review using our clinic review tool, if applicable. No additional management support is needed unless otherwise documented below in the visit note.  L ear feels stuffy.  No pain.  No R sided sx.  "Like something is sloshing around in there.'  No FCNAVD.  No ST.    Meds, vitals, and allergies reviewed.   ROS: See HPI.  Otherwise, noncontributory.  nad ncat R TM wnl L TM with cerumen impaction, mostly removed with irrigation and curette, hearing back to normal and partially seen TM wnl

## 2015-02-09 NOTE — Patient Instructions (Signed)
Use OTC debrox and let it sit a few minutes before getting in the shower.  That should help. Take care.

## 2015-02-11 DIAGNOSIS — H6120 Impacted cerumen, unspecified ear: Secondary | ICD-10-CM | POA: Insufficient documentation

## 2015-02-11 NOTE — Assessment & Plan Note (Signed)
Nearly resolved, hearing back to baseline.  Can use OTC debrox and irrigate at home.  Fu prn.  He agrees.

## 2015-05-18 ENCOUNTER — Encounter: Payer: Self-pay | Admitting: Family Medicine

## 2015-05-18 ENCOUNTER — Ambulatory Visit (INDEPENDENT_AMBULATORY_CARE_PROVIDER_SITE_OTHER): Payer: Medicare Other | Admitting: Family Medicine

## 2015-05-18 VITALS — BP 130/78 | HR 86 | Temp 98.6°F | Wt 228.0 lb

## 2015-05-18 DIAGNOSIS — S90424A Blister (nonthermal), right lesser toe(s), initial encounter: Secondary | ICD-10-CM

## 2015-05-18 DIAGNOSIS — S90426A Blister (nonthermal), unspecified lesser toe(s), initial encounter: Secondary | ICD-10-CM | POA: Insufficient documentation

## 2015-05-18 MED ORDER — CEPHALEXIN 500 MG PO CAPS: 500.0000 mg | ORAL_CAPSULE | Freq: Three times a day (TID) | ORAL | Status: DC

## 2015-05-18 NOTE — Assessment & Plan Note (Signed)
Doesn't look infected, but would still start keflex given the situation/location.  Doesn't appear deep, only superficial tissue appears to be involved.  Topical care o/w, see AVS.  He'll update me as needed. He agrees.  F/u prn.

## 2015-05-18 NOTE — Patient Instructions (Addendum)
Keep it clean and covered.  Start the antibiotics, change shoes and update me as needed.  Take care.  Glad to see you.  I sent the antibiotics to [facility].

## 2015-05-18 NOTE — Progress Notes (Signed)
Pre visit review using our clinic review tool, if applicable. No additional management support is needed unless otherwise documented below in the visit note.  New shoes and had some rubbing.  Blistering on the R 1st toe.  Had drained some fluid, clear.  No FCNAVD.  Still with sensation in feet but area isn't painful.   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    Sugar has been controlled on home checks.    Meds, vitals, and allergies reviewed.   ROS: See HPI.  Otherwise, noncontributory.  nad R 1st toe with distal ruptured blister, clear discharge, sensation and DP intact.  No spreading redness.  No pus.

## 2015-08-19 ENCOUNTER — Ambulatory Visit (INDEPENDENT_AMBULATORY_CARE_PROVIDER_SITE_OTHER): Payer: Medicare Other

## 2015-08-19 DIAGNOSIS — Z23 Encounter for immunization: Secondary | ICD-10-CM

## 2015-10-19 ENCOUNTER — Encounter: Payer: Self-pay | Admitting: Family Medicine

## 2015-10-19 ENCOUNTER — Ambulatory Visit (INDEPENDENT_AMBULATORY_CARE_PROVIDER_SITE_OTHER): Payer: Medicare Other | Admitting: Family Medicine

## 2015-10-19 VITALS — BP 136/76 | HR 78 | Temp 98.5°F | Wt 228.0 lb

## 2015-10-19 DIAGNOSIS — L989 Disorder of the skin and subcutaneous tissue, unspecified: Secondary | ICD-10-CM

## 2015-10-19 DIAGNOSIS — J209 Acute bronchitis, unspecified: Secondary | ICD-10-CM

## 2015-10-19 DIAGNOSIS — R238 Other skin changes: Secondary | ICD-10-CM | POA: Insufficient documentation

## 2015-10-19 MED ORDER — AZITHROMYCIN 250 MG PO TABS: ORAL_TABLET | ORAL | Status: DC

## 2015-10-19 MED ORDER — HYDROCODONE-HOMATROPINE 5-1.5 MG/5ML PO SYRP: 2.5000 mL | ORAL_SOLUTION | Freq: Three times a day (TID) | ORAL | Status: DC | PRN

## 2015-10-19 NOTE — Patient Instructions (Signed)
I wouldn't do anything about your toe except to keep wearing a thick and padded sock.  It doesn't look infected.  Start the antibiotics and use the cough medicine as needed.  The cough medicine can make you drowsy.  Take care.  Glad to see you.

## 2015-10-19 NOTE — Assessment & Plan Note (Signed)
Toe irritation looks chronic, not acutely infected. Continue with adequate padding with sock and update me as needed.  It isn't ulcerated.  This may just be a really slowly healing lesion.

## 2015-10-19 NOTE — Assessment & Plan Note (Signed)
Would treat given his hx.  Start zmax, use hycodan prn and update me as needed. Routine caution on meds given.  He agrees.  Nontoxic.  Okay for outpatient f/u.

## 2015-10-19 NOTE — Progress Notes (Signed)
Pre visit review using our clinic review tool, if applicable. No additional management support is needed unless otherwise documented below in the visit note.  R 1st toe never healed fully from prev blister.  Will scab over and then peel off.  No drainage, not painful.  He still has feeling in the toe.  Sugar usually ~115 in the AMs.  Usually controlled.    Cold sx.  Started about 5 days ago.  Yellow sputum. More cough.  No fevers.  No vomiting, no diarrhea.  No myalgias.  No ear pain, no facial pain.  No rhinorrhea.   Meds, vitals, and allergies reviewed.   ROS: See HPI.  Otherwise, noncontributory.  nad ncat Tm wnl Nasal exam wnl OP w/o erythema Neck with chronic changes noted, stoma clean and intact abd soft IRR, not tachy ctab except for scattered rhonchi B, no focal dec in BS Ext with R foot with tip of 1st toe with old irritation but no ulceration.  No drainage or spreading erythema.

## 2015-10-26 LAB — HM DIABETES EYE EXAM

## 2015-12-07 ENCOUNTER — Other Ambulatory Visit: Payer: Self-pay

## 2015-12-07 MED ORDER — METFORMIN HCL 500 MG PO TABS
500.0000 mg | ORAL_TABLET | Freq: Two times a day (BID) | ORAL | Status: DC
Start: 2015-12-07 — End: 2015-12-23

## 2015-12-07 NOTE — Telephone Encounter (Signed)
Mrs Wasley left v/m; pt does not have enough metformin to last until CPX on 12/22/14. Request 30 supply to [facility]. Advised done.

## 2015-12-16 ENCOUNTER — Other Ambulatory Visit: Payer: Self-pay | Admitting: Family Medicine

## 2015-12-16 DIAGNOSIS — I4891 Unspecified atrial fibrillation: Secondary | ICD-10-CM

## 2015-12-16 DIAGNOSIS — E113599 Type 2 diabetes mellitus with proliferative diabetic retinopathy without macular edema, unspecified eye: Secondary | ICD-10-CM

## 2015-12-20 ENCOUNTER — Other Ambulatory Visit (INDEPENDENT_AMBULATORY_CARE_PROVIDER_SITE_OTHER): Payer: Medicare Other

## 2015-12-20 DIAGNOSIS — E113599 Type 2 diabetes mellitus with proliferative diabetic retinopathy without macular edema, unspecified eye: Secondary | ICD-10-CM | POA: Diagnosis not present

## 2015-12-20 DIAGNOSIS — I4891 Unspecified atrial fibrillation: Secondary | ICD-10-CM | POA: Diagnosis not present

## 2015-12-20 LAB — COMPREHENSIVE METABOLIC PANEL
ALBUMIN: 3.7 g/dL (ref 3.5–5.2)
ALT: 15 U/L (ref 0–53)
AST: 27 U/L (ref 0–37)
Alkaline Phosphatase: 77 U/L (ref 39–117)
BILIRUBIN TOTAL: 0.7 mg/dL (ref 0.2–1.2)
BUN: 27 mg/dL — ABNORMAL HIGH (ref 6–23)
CALCIUM: 9.8 mg/dL (ref 8.4–10.5)
CHLORIDE: 104 meq/L (ref 96–112)
CO2: 29 meq/L (ref 19–32)
CREATININE: 1.13 mg/dL (ref 0.40–1.50)
GFR: 66.06 mL/min (ref >60.00)
Glucose, Bld: 113 mg/dL — ABNORMAL HIGH (ref 70–99)
Potassium: 5.2 mEq/L — ABNORMAL HIGH (ref 3.5–5.1)
Sodium: 140 mEq/L (ref 135–145)
Total Protein: 7.3 g/dL (ref 6.0–8.3)

## 2015-12-20 LAB — CBC WITH DIFFERENTIAL/PLATELET
BASOS PCT: 0.6 % (ref 0.0–3.0)
Basophils Absolute: 0 10*3/uL (ref 0.0–0.1)
Eosinophils Absolute: 0.1 10*3/uL (ref 0.0–0.7)
Eosinophils Relative: 1.7 % (ref 0.0–5.0)
HEMATOCRIT: 46.3 % (ref 39.0–52.0)
HEMOGLOBIN: 15.2 g/dL (ref 13.0–17.0)
LYMPHS PCT: 25.2 % (ref 12.0–46.0)
Lymphs Abs: 1.8 10*3/uL (ref 0.7–4.0)
MCHC: 32.8 g/dL (ref 30.0–36.0)
MCV: 103.5 fl — ABNORMAL HIGH (ref 78.0–100.0)
MONOS PCT: 9.8 % (ref 3.0–12.0)
Monocytes Absolute: 0.7 10*3/uL (ref 0.1–1.0)
NEUTROS ABS: 4.6 10*3/uL (ref 1.4–7.7)
Neutrophils Relative %: 62.7 % (ref 43.0–77.0)
PLATELETS: 194 10*3/uL (ref 150.0–400.0)
RBC: 4.47 Mil/uL (ref 4.22–5.81)
RDW: 12.9 % (ref 11.5–15.5)
WBC: 7.3 10*3/uL (ref 4.0–10.5)

## 2015-12-20 LAB — LIPID PANEL
CHOLESTEROL: 118 mg/dL (ref 0–200)
HDL: 51.2 mg/dL (ref >39.00)
LDL CALC: 52 mg/dL (ref 0–99)
NonHDL: 66.71
TRIGLYCERIDES: 72 mg/dL (ref 0.0–149.0)
Total CHOL/HDL Ratio: 2
VLDL: 14.4 mg/dL (ref 0.0–40.0)

## 2015-12-20 LAB — TSH: TSH: 0.96 u[IU]/mL (ref 0.35–4.50)

## 2015-12-20 LAB — HEMOGLOBIN A1C: Hgb A1c MFr Bld: 6.7 % — ABNORMAL HIGH (ref 4.6–6.5)

## 2015-12-23 ENCOUNTER — Encounter: Payer: Self-pay | Admitting: Family Medicine

## 2015-12-23 ENCOUNTER — Ambulatory Visit (INDEPENDENT_AMBULATORY_CARE_PROVIDER_SITE_OTHER): Payer: Medicare Other | Admitting: Family Medicine

## 2015-12-23 ENCOUNTER — Ambulatory Visit (INDEPENDENT_AMBULATORY_CARE_PROVIDER_SITE_OTHER)
Admission: RE | Admit: 2015-12-23 | Discharge: 2015-12-23 | Disposition: A | Discharge status: Home / Self Care | Payer: Medicare Other | Source: Ambulatory Visit | Attending: Family Medicine | Admitting: Family Medicine

## 2015-12-23 VITALS — BP 130/80 | HR 73 | Temp 97.8°F | Ht 76.0 in | Wt 231.8 lb

## 2015-12-23 DIAGNOSIS — I4891 Unspecified atrial fibrillation: Secondary | ICD-10-CM

## 2015-12-23 DIAGNOSIS — E119 Type 2 diabetes mellitus without complications: Secondary | ICD-10-CM

## 2015-12-23 DIAGNOSIS — I10 Essential (primary) hypertension: Secondary | ICD-10-CM

## 2015-12-23 DIAGNOSIS — C329 Malignant neoplasm of larynx, unspecified: Secondary | ICD-10-CM

## 2015-12-23 DIAGNOSIS — Z85818 Personal history of malignant neoplasm of other sites of lip, oral cavity, and pharynx: Secondary | ICD-10-CM

## 2015-12-23 DIAGNOSIS — E785 Hyperlipidemia, unspecified: Secondary | ICD-10-CM | POA: Diagnosis not present

## 2015-12-23 DIAGNOSIS — Z85819 Personal history of malignant neoplasm of unspecified site of lip, oral cavity, and pharynx: Secondary | ICD-10-CM

## 2015-12-23 DIAGNOSIS — Z0000 Encounter for general adult medical examination without abnormal findings: Secondary | ICD-10-CM | POA: Diagnosis not present

## 2015-12-23 LAB — HM DIABETES FOOT EXAM

## 2015-12-23 MED ORDER — RAMIPRIL 5 MG PO CAPS: 5.0000 mg | ORAL_CAPSULE | Freq: Every day | ORAL | Status: DC

## 2015-12-23 MED ORDER — SIMVASTATIN 40 MG PO TABS: 40.0000 mg | ORAL_TABLET | Freq: Every day | ORAL | Status: DC

## 2015-12-23 MED ORDER — HYDROCHLOROTHIAZIDE 12.5 MG PO TABS: 12.5000 mg | ORAL_TABLET | Freq: Every day | ORAL | Status: DC

## 2015-12-23 MED ORDER — METFORMIN HCL 500 MG PO TABS: 500.0000 mg | ORAL_TABLET | Freq: Two times a day (BID) | ORAL | Status: DC

## 2015-12-23 MED ORDER — PIOGLITAZONE HCL 45 MG PO TABS: 45.0000 mg | ORAL_TABLET | Freq: Every day | ORAL | Status: DC

## 2015-12-23 NOTE — Progress Notes (Signed)
Pre visit review using our clinic review tool, if applicable. No additional management support is needed unless otherwise documented below in the visit note.  I have personally reviewed the Medicare Annual Wellness questionnaire and have noted 1. The patient's medical and social history 2. Their use of alcohol, tobacco or illicit drugs 3. Their current medications and supplements 4. The patient's functional ability including ADL's, fall risks, home safety risks and hearing or visual             impairment. 5. Diet and physical activities 6. Evidence for depression or mood disorders  The patients weight, height, BMI have been recorded in the chart and visual acuity is per eye clinic.  I have made referrals, counseling and provided education to the patient based review of the above and I have provided the pt with a written personalized care plan for preventive services.  Provider list updated- see scanned forms.  Routine anticipatory guidance given to patient.  See health maintenance.  Flu 2016 Shingles 2009 PNA up to date Tetanus 2007 Colonoscopy NA due to age. He agrees.  Prostate cancer screening NA due to age. He agrees.  Advance directive-Advance directive- wife designated if patient were incapacitated. Cognitive function addressed- see scanned forms- and if abnormal then additional documentation follows.   Diabetes:  Using medications without difficulties:yes Hypoglycemic episodes:no Hyperglycemic episodes:no Feet problems: calluses at baseline, no new changes.  Blood Sugars averaging: ~100 usually in AM eye exam within last year: yes, ~2-3 months ago. No new retinopathy per patient report.   Hypertension:    Using medication without problems or lightheadedness: yes Chest pain with exertion:no Edema:no Short of breath:no  Elevated Cholesterol: Using medications without problems:yes Muscle aches: no Diet compliance:yes Exercise:yes  Due for f/u CXR given h/o laryngeal  cancer and stoma.  No change with the stoma noted.    H/o AF.  Not interested in anticoagulation.  D/w pt.   PMH and SH reviewed.   Vital signs, Meds and allergies reviewed.  ROS: See HPI.  Otherwise nontributory.   GEN: nad, alert and oriented HEENT: mucous membranes moist NECK: supple w/o LA, stoma clean and intact CV: IRR.  no murmur PULM: ctab, no inc wob ABD: soft, +bs EXT: no edema SKIN: no acute rash  Diabetic foot exam: Normal inspection except for calluses noted and nails thickened.   No skin breakdown Normal DP pulses Normal sensation to light tough and monofilament

## 2015-12-23 NOTE — Patient Instructions (Signed)
Go to the lab on the way out.  We'll contact you with your xray report.  Take care. Glad to see you.  Don't change your meds for now.

## 2015-12-26 NOTE — Assessment & Plan Note (Signed)
See notes on CXR.  No changes in stoma.

## 2015-12-26 NOTE — Assessment & Plan Note (Signed)
Controlled, continue as is.  D/w pt.  He agrees.  No change in meds.

## 2015-12-26 NOTE — Assessment & Plan Note (Signed)
Flu 2016  Shingles 2009  PNA up to date  Tetanus 2007  Colonoscopy NA due to age. He agrees.  Prostate cancer screening NA due to age. He agrees.  Advance directive-Advance directive- wife designated if patient were incapacitated.  Cognitive function addressed- see scanned forms- and if abnormal then additional documentation follows.

## 2015-12-26 NOTE — Assessment & Plan Note (Signed)
He doesn't want anticoagulation.

## 2015-12-26 NOTE — Assessment & Plan Note (Signed)
Controlled, no change in meds.  Continue as is.  He agrees.

## 2016-02-20 DIAGNOSIS — R1313 Dysphagia, pharyngeal phase: Secondary | ICD-10-CM | POA: Diagnosis not present

## 2016-02-20 DIAGNOSIS — H6123 Impacted cerumen, bilateral: Secondary | ICD-10-CM | POA: Diagnosis not present

## 2016-07-16 DIAGNOSIS — D2262 Melanocytic nevi of left upper limb, including shoulder: Secondary | ICD-10-CM | POA: Diagnosis not present

## 2016-07-16 DIAGNOSIS — D2261 Melanocytic nevi of right upper limb, including shoulder: Secondary | ICD-10-CM | POA: Diagnosis not present

## 2016-07-16 DIAGNOSIS — Z85828 Personal history of other malignant neoplasm of skin: Secondary | ICD-10-CM | POA: Diagnosis not present

## 2016-07-16 DIAGNOSIS — D692 Other nonthrombocytopenic purpura: Secondary | ICD-10-CM | POA: Diagnosis not present

## 2016-07-17 ENCOUNTER — Encounter: Payer: Self-pay | Admitting: Family Medicine

## 2016-07-17 ENCOUNTER — Ambulatory Visit (INDEPENDENT_AMBULATORY_CARE_PROVIDER_SITE_OTHER): Payer: Medicare Other | Admitting: Family Medicine

## 2016-07-17 DIAGNOSIS — S46111A Strain of muscle, fascia and tendon of long head of biceps, right arm, initial encounter: Secondary | ICD-10-CM | POA: Diagnosis not present

## 2016-07-17 DIAGNOSIS — S46211A Strain of muscle, fascia and tendon of other parts of biceps, right arm, initial encounter: Secondary | ICD-10-CM

## 2016-07-17 DIAGNOSIS — S46219A Strain of muscle, fascia and tendon of other parts of biceps, unspecified arm, initial encounter: Secondary | ICD-10-CM | POA: Insufficient documentation

## 2016-07-17 NOTE — Progress Notes (Signed)
Pre visit review using our clinic review tool, if applicable. No additional management support is needed unless otherwise documented below in the visit note. 

## 2016-07-17 NOTE — Progress Notes (Signed)
R arm sx started about 2 weeks ago.  Pain with lifting something.  No trauma.  Some bruising on the distal biceps.  No knot or lump in the arm.  No problems with the triceps area.  Olecranon isn't ttp but distal biceps area is ttp.  R grip is still at baseline.    Meds, vitals, and allergies reviewed.   ROS: Per HPI unless specifically indicated in ROS section   nad ncat Tracheostomy at baseline Normal R shoulder ROM but pain with testing lateral portion of the biceps, midportion of the muscle.  I can feel a small depression there but not a full popeye deformity proximally or distally.  Origin and insertion of biceps still intact.  Some distal upper arm bruising noted, proximal to antecubital fossa.  Olecranon not ttp, normal elbow ROM, grip at baseline accounting for baseline hand changes.

## 2016-07-17 NOTE — Patient Instructions (Signed)
Likely a partial tear of the right biceps.  You can lift and work as tolerated.  If it hurts, you're doing to much.  Update [place] as needed.  Take care.  Glad to see you.

## 2016-07-17 NOTE — Assessment & Plan Note (Signed)
D/w pt.  Likely a partial tear of the right biceps.  He can lift and work as tolerated.  If it hurts, he's doing to much.  He understood.  We can xray or refer to ortho, but I don't expect either to change mgmt.  He declined both.  He may end up with a full tear of the muscle belly, but as long as the other portion of the biceps is still intact he should have normal ROM and eventually nearly normal function/compensation.  He agrees.  He'll update me as needed.  I think he wouldn't be a great surgical candidate at this point, d/w pt.

## 2016-08-08 ENCOUNTER — Ambulatory Visit (INDEPENDENT_AMBULATORY_CARE_PROVIDER_SITE_OTHER): Payer: Medicare Other

## 2016-08-08 DIAGNOSIS — Z23 Encounter for immunization: Secondary | ICD-10-CM

## 2016-09-16 ENCOUNTER — Telehealth: Payer: Self-pay | Admitting: Family Medicine

## 2016-09-16 NOTE — Telephone Encounter (Signed)
Call patient. The point of this call is to offer him a chance to go back for the repeat carotid study. He doesn't have to go back, this call is just offer him the chance to recheck. My understanding was that when we checked 2 years ago he did not want to follow-up on this test. I wanted to give him 1 more chance to make a decision. If I need to put in the order please let me know. I did not order the carotid ultrasound yet. Thanks.

## 2016-09-17 NOTE — Telephone Encounter (Signed)
Thanks

## 2016-09-17 NOTE — Telephone Encounter (Signed)
Patient refused the option to have a repeat carotid study.

## 2016-10-29 DIAGNOSIS — H52223 Regular astigmatism, bilateral: Secondary | ICD-10-CM | POA: Diagnosis not present

## 2016-12-25 ENCOUNTER — Ambulatory Visit (INDEPENDENT_AMBULATORY_CARE_PROVIDER_SITE_OTHER): Payer: Medicare Other

## 2016-12-25 ENCOUNTER — Other Ambulatory Visit: Payer: Self-pay | Admitting: Family Medicine

## 2016-12-25 ENCOUNTER — Ambulatory Visit (INDEPENDENT_AMBULATORY_CARE_PROVIDER_SITE_OTHER)
Admission: RE | Admit: 2016-12-25 | Discharge: 2016-12-25 | Disposition: A | Discharge status: Home / Self Care | Payer: Medicare Other | Source: Ambulatory Visit | Attending: Family Medicine | Admitting: Family Medicine

## 2016-12-25 VITALS — BP 130/74 | HR 48 | Temp 97.7°F | Ht 74.75 in | Wt 230.8 lb

## 2016-12-25 DIAGNOSIS — Z85819 Personal history of malignant neoplasm of unspecified site of lip, oral cavity, and pharynx: Secondary | ICD-10-CM

## 2016-12-25 DIAGNOSIS — E119 Type 2 diabetes mellitus without complications: Secondary | ICD-10-CM

## 2016-12-25 DIAGNOSIS — Z0000 Encounter for general adult medical examination without abnormal findings: Secondary | ICD-10-CM | POA: Diagnosis not present

## 2016-12-25 DIAGNOSIS — J449 Chronic obstructive pulmonary disease, unspecified: Secondary | ICD-10-CM | POA: Diagnosis not present

## 2016-12-25 DIAGNOSIS — Z23 Encounter for immunization: Secondary | ICD-10-CM

## 2016-12-25 LAB — CBC WITH DIFFERENTIAL/PLATELET
BASOS ABS: 0 10*3/uL (ref 0.0–0.1)
Basophils Relative: 0.7 % (ref 0.0–3.0)
EOS ABS: 0.1 10*3/uL (ref 0.0–0.7)
Eosinophils Relative: 1.2 % (ref 0.0–5.0)
HEMATOCRIT: 45.1 % (ref 39.0–52.0)
HEMOGLOBIN: 15.1 g/dL (ref 13.0–17.0)
LYMPHS PCT: 21.7 % (ref 12.0–46.0)
Lymphs Abs: 1.5 10*3/uL (ref 0.7–4.0)
MCHC: 33.4 g/dL (ref 30.0–36.0)
MCV: 104.5 fl — ABNORMAL HIGH (ref 78.0–100.0)
Monocytes Absolute: 0.6 10*3/uL (ref 0.1–1.0)
Monocytes Relative: 9.1 % (ref 3.0–12.0)
NEUTROS ABS: 4.5 10*3/uL (ref 1.4–7.7)
Neutrophils Relative %: 67.3 % (ref 43.0–77.0)
PLATELETS: 193 10*3/uL (ref 150.0–400.0)
RBC: 4.32 Mil/uL (ref 4.22–5.81)
RDW: 13 % (ref 11.5–15.5)
WBC: 6.8 10*3/uL (ref 4.0–10.5)

## 2016-12-25 LAB — COMPREHENSIVE METABOLIC PANEL
ALT: 15 U/L (ref 0–53)
AST: 28 U/L (ref 0–37)
Albumin: 3.9 g/dL (ref 3.5–5.2)
Alkaline Phosphatase: 86 U/L (ref 39–117)
BILIRUBIN TOTAL: 1 mg/dL (ref 0.2–1.2)
BUN: 27 mg/dL — AB (ref 6–23)
CO2: 33 meq/L — AB (ref 19–32)
CREATININE: 1.1 mg/dL (ref 0.40–1.50)
Calcium: 10 mg/dL (ref 8.4–10.5)
Chloride: 102 mEq/L (ref 96–112)
GFR: 67.98 mL/min (ref >60.00)
GLUCOSE: 107 mg/dL — AB (ref 70–99)
Potassium: 4.7 mEq/L (ref 3.5–5.1)
Sodium: 140 mEq/L (ref 135–145)
TOTAL PROTEIN: 7.7 g/dL (ref 6.0–8.3)

## 2016-12-25 LAB — LIPID PANEL
CHOL/HDL RATIO: 2
Cholesterol: 119 mg/dL (ref 0–200)
HDL: 51.2 mg/dL (ref >39.00)
LDL Cholesterol: 54 mg/dL (ref 0–99)
NONHDL: 68.01
Triglycerides: 68 mg/dL (ref 0.0–149.0)
VLDL: 13.6 mg/dL (ref 0.0–40.0)

## 2016-12-25 LAB — TSH: TSH: 0.79 u[IU]/mL (ref 0.35–4.50)

## 2016-12-25 LAB — HEMOGLOBIN A1C: HEMOGLOBIN A1C: 6.6 % — AB (ref 4.6–6.5)

## 2016-12-25 NOTE — Patient Instructions (Signed)
David [name] , Thank you for taking time to come for your Medicare Wellness Visit. I appreciate your ongoing commitment to your health goals. Please review the following plan we discussed and let me know if I can assist you in the future.   These are the goals we discussed: Goals    . 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        This is a list of the screening recommended for you and due dates:  Health Maintenance  Topic Date Due  . Complete foot exam   01/23/2017*  . Hemoglobin A1C  06/24/2017  . Eye exam for diabetics  11/21/2017  . Tetanus Vaccine  12/25/2026  . Flu Shot  Completed  . Shingles Vaccine  Completed  . Pneumonia vaccines  Completed  *Topic was postponed. The date shown is not the original due date.   Preventive Care for Adults  A healthy lifestyle and preventive care can promote health and wellness. Preventive health guidelines for adults include the following key practices.  . A routine yearly physical is a good way to check with your health care provider about your health and preventive screening. It is a chance to share any concerns and updates on your health and to receive a thorough exam.  . Visit your dentist for a routine exam and preventive care every 6 months. Brush your teeth twice a day and floss once a day. Good oral hygiene prevents tooth decay and gum disease.  . The frequency of eye exams is based on your age, health, family medical history, use  of contact lenses, and other factors. Follow your health care provider's ecommendations for frequency of eye exams.  . Eat a healthy diet. Foods like vegetables, fruits, whole grains, low-fat dairy products, and lean protein foods contain the nutrients you need without too many calories. Decrease your intake of foods high in solid fats, added sugars, and salt. Eat the right amount of calories for you. Get information about a proper diet from  your health care provider, if necessary.  . Regular physical exercise is one of the most important things you can do for your health. Most adults should get at least 150 minutes of moderate-intensity exercise (any activity that increases your heart rate and causes you to sweat) each week. In addition, most adults need muscle-strengthening exercises on 2 or more days a week.  Silver Sneakers may be a benefit available to you. To determine eligibility, you may visit the website: www.silversneakers.com or contact program at (520)807-7708 Mon-Fri between 8AM-8PM.   . Maintain a healthy weight. The body mass index (BMI) is a screening tool to identify possible weight problems. It provides an estimate of body fat based on height and weight. Your health care provider can find your BMI and can help you achieve or maintain a healthy weight.   For adults 20 years and older: ? A BMI below 18.5 is considered underweight. ? A BMI of 18.5 to 24.9 is normal. ? A BMI of 25 to 29.9 is considered overweight. ? A BMI of 30 and above is considered obese.   . Maintain normal blood lipids and cholesterol levels by exercising and minimizing your intake of saturated fat. Eat a balanced diet with plenty of fruit and vegetables. Blood tests for lipids and cholesterol should begin at age 71 and be repeated every 5 years. If your lipid or cholesterol  levels are high, you are over 50, or you are at high risk for heart disease, you may need your cholesterol levels checked more frequently. Ongoing high lipid and cholesterol levels should be treated with medicines if diet and exercise are not working.  . If you smoke, find out from your health care provider how to quit. If you do not use tobacco, please do not start.  . If you choose to drink alcohol, please do not consume more than 2 drinks per day. One drink is considered to be 12 ounces (355 mL) of beer, 5 ounces (148 mL) of wine, or 1.5 ounces (44 mL) of liquor.  . If you  are 50-23 years old, ask your health care provider if you should take aspirin to prevent strokes.  . Use sunscreen. Apply sunscreen liberally and repeatedly throughout the day. You should seek shade when your shadow is shorter than you. Protect yourself by wearing long sleeves, pants, a wide-brimmed hat, and sunglasses year round, whenever you are outdoors.  . Once a month, do a whole body skin exam, using a mirror to look at the skin on your back. Tell your health care provider of new moles, moles that have irregular borders, moles that are larger than a pencil eraser, or moles that have changed in shape or color.

## 2016-12-25 NOTE — Progress Notes (Signed)
Subjective:   David [name] is a 83 y.o. male who presents for Medicare Annual/Subsequent preventive examination.  Review of Systems:  N/A Cardiac Risk Factors include: advanced age (>11men, >32 women);male gender;diabetes mellitus;dyslipidemia;hypertension     Objective:    Vitals: BP 130/74 (BP Location: Right Arm, Patient Position: Sitting, Cuff Size: Normal)   Pulse (!) 48   Temp 97.7 F (36.5 C) (Oral)   Ht 6' 2.75" (1.899 m) Comment: shoes  Wt 230 lb 12 oz (104.7 kg)   SpO2 93%   BMI 29.04 kg/m   Body mass index is 29.04 kg/m.  Tobacco History  Smoking Status  . Former Smoker  Smokeless Tobacco  . Never Used     Counseling given: No   Past Medical History:  Diagnosis Date  . Cancer of neck ([place])   . Diabetes mellitus    Type II  . History of atrial fibrillation   . Hyperkalemia    with Ramipril dose at 10 mg, K wnl at 5 mg. per day  . Hyperlipidemia   . Hypertension    Pulmonary   Past Surgical History:  Procedure Laterality Date  . 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 CATARACT EXTRACTION  2007  . DOPPLER ECHOCARDIOGRAPHY     Atrial fib EF 60%--01/18/1994, 12/02/02--atrial fib, EF 50-55  . DOPPLER ECHOCARDIOGRAPHY     ra,la,lv,mild dilat.,trace mitral regurg., mild pulm. htn  . PILONIDAL CYST EXCISION    . TOTAL LARYNGECTOMY  ~1993   with left radical neck dissection   Family History  Problem Relation Age of Onset  . Cirrhosis Father   . Colon cancer Neg Hx   . Prostate cancer Neg Hx    History  Sexual Activity  . Sexual activity: Yes    Outpatient Encounter Prescriptions as of 12/25/2016  Medication Sig  . aspirin 81 MG tablet Take 81 mg by mouth daily.  . fish oil-omega-3 fatty acids 1000 MG capsule Take 2 g by mouth daily.    . hydrochlorothiazide (HYDRODIURIL) 12.5 MG tablet Take 1 tablet (12.5 mg total) by mouth daily.  . metFORMIN (GLUCOPHAGE) 500 MG tablet Take 1 tablet (500 mg total) by mouth 2 (two)  times daily with a meal.  . pioglitazone (ACTOS) 45 MG tablet Take 1 tablet (45 mg total) by mouth daily.  . ramipril (ALTACE) 5 MG capsule Take 1 capsule (5 mg total) by mouth daily.  . simvastatin (ZOCOR) 40 MG tablet Take 1 tablet (40 mg total) by mouth at bedtime.   No facility-administered encounter medications on file as of 12/25/2016.     Activities of Daily Living In your present state of health, do you have any difficulty performing the following activities: 12/25/2016  Hearing? N  Vision? N  Difficulty concentrating or making decisions? N  Walking or climbing stairs? N  Dressing or bathing? N  Doing errands, shopping? N  Preparing Food and eating ? N  Using the Toilet? N  In the past six months, have you accidently leaked urine? N  Do you have problems with loss of bowel control? N  Managing your Medications? N  Managing your Finances? N  Housekeeping or managing your Housekeeping? N  Some recent data might be hidden    Patient Care Team: Tonia Ghent, MD as PCP - General Amy Martinique, MD as Consulting Physician (Dermatology)   Assessment:     Hearing Screening   125Hz  250Hz  500Hz  1000Hz  2000Hz   3000Hz  4000Hz  6000Hz  8000Hz   Right ear:   0 0 40  0    Left ear:   40 0 40  0    Vision Screening Comments: Last vision exam in December 2017 with Dr. Kathrin Penner   Exercise Activities and Dietary recommendations Current Exercise Habits: The patient does not participate in regular exercise at present, Exercise limited by: None identified  Goals    . 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       Fall Risk Fall Risk  12/25/2016 12/23/2015 12/20/2014  Falls in the past year? Yes No Yes  Number falls in past yr: 1 - -  Injury with Fall? Yes - -   Depression Screen PHQ 2/9 Scores 12/25/2016 12/23/2015 12/20/2014  PHQ - 2 Score 0 0 0    Cognitive Function MMSE - Mini Mental State Exam 12/25/2016    Orientation to time 5  Orientation to Place 5  Registration 3  Attention/ Calculation 0  Recall 2  Recall-comments pt was unable to recall 1 of 3 words  Language- name 2 objects 0  Language- repeat 1  Language- follow 3 step command 2  Language- follow 3 step command-comments pt was unable to follow 1 step of 3 step command  Language- read & follow direction 0  Write a sentence 0  Copy design 0  Total score 18       PLEASE NOTE: A Mini-Cog screen was completed. Maximum score is 20. A value of 0 denotes this part of Folstein MMSE was not completed or the patient failed this part of the Mini-Cog screening.   Mini-Cog Screening Orientation to Time - Max 5 pts Orientation to Place - Max 5 pts Registration - Max 3 pts Recall - Max 3 pts Language Repeat - Max 1 pts Language Follow 3 Step Command - Max 3 pts   Immunization History  Administered Date(s) Administered  . Influenza Split 09/20/2011, 09/02/2012  . Influenza Whole 09/22/2007, 09/22/2009, 08/16/2010  . Influenza,inj,Quad PF,36+ Mos 09/02/2013, 08/18/2014, 08/19/2015, 08/08/2016  . Pneumococcal Conjugate-13 12/20/2014  . Pneumococcal Polysaccharide-23 11/27/1995, 09/22/2009  . Td 09/19/2006  . Zoster 12/02/2007   Screening Tests Health Maintenance  Topic Date Due  . FOOT EXAM  01/23/2017 (Originally 12/22/2016)  . HEMOGLOBIN A1C  06/24/2017  . OPHTHALMOLOGY EXAM  11/21/2017  . TETANUS/TDAP  12/25/2026  . INFLUENZA VACCINE  Completed  . ZOSTAVAX  Completed  . PNA vac Low Risk Adult  Completed      Plan:     I have personally reviewed and addressed the Medicare Annual Wellness questionnaire and have noted the following in the patient's chart:  A. Medical and social history B. Use of alcohol, tobacco or illicit drugs  C. Current medications and supplements D. Functional ability and status E.  Nutritional status F.  Physical activity G. Advance directives H. List of other physicians I.  Hospitalizations,  surgeries, and ER visits in previous 12 months J.  [place] to include hearing, vision, cognitive, depression L. Referrals and appointments - none  In addition, I have reviewed and discussed with patient certain preventive protocols, quality metrics, and best practice recommendations. A written personalized care plan for preventive services as well as general preventive health recommendations were provided to patient.  See attached scanned questionnaire for additional information.   Signed,   Lindell Noe, MHA, BS, LPN Health Coach

## 2016-12-25 NOTE — Progress Notes (Signed)
Pre visit review using our clinic review tool, if applicable. No additional management support is needed unless otherwise documented below in the visit note. 

## 2016-12-25 NOTE — Progress Notes (Signed)
PCP notes:   Health maintenance:  Foot exam - PCP will complete at CPE A1C - completed Eye exam - per pt completed in Dec 2017 Tetanus - administered  Abnormal screenings:   Hearing - failed Fall risk - hx of fall with injury Mini-Cog score: 18/20  Patient concerns:   None  Nurse concerns:  None  Next PCP appt:   01/01/17 @ 0945  I reviewed health advisor's note, was available for consultation on the day of service listed in this note, and agree with documentation and plan. Elsie Stain, MD.

## 2017-01-01 ENCOUNTER — Encounter: Payer: Self-pay | Admitting: Family Medicine

## 2017-01-01 ENCOUNTER — Ambulatory Visit (INDEPENDENT_AMBULATORY_CARE_PROVIDER_SITE_OTHER): Payer: Medicare Other | Admitting: Family Medicine

## 2017-01-01 VITALS — BP 118/74 | HR 65 | Temp 97.7°F | Resp 20 | Ht 75.0 in | Wt 232.0 lb

## 2017-01-01 DIAGNOSIS — R399 Unspecified symptoms and signs involving the genitourinary system: Secondary | ICD-10-CM | POA: Insufficient documentation

## 2017-01-01 DIAGNOSIS — Z0000 Encounter for general adult medical examination without abnormal findings: Secondary | ICD-10-CM

## 2017-01-01 DIAGNOSIS — E119 Type 2 diabetes mellitus without complications: Secondary | ICD-10-CM

## 2017-01-01 DIAGNOSIS — E785 Hyperlipidemia, unspecified: Secondary | ICD-10-CM | POA: Diagnosis not present

## 2017-01-01 DIAGNOSIS — I10 Essential (primary) hypertension: Secondary | ICD-10-CM

## 2017-01-01 DIAGNOSIS — C329 Malignant neoplasm of larynx, unspecified: Secondary | ICD-10-CM

## 2017-01-01 MED ORDER — METFORMIN HCL 500 MG PO TABS: 500.0000 mg | ORAL_TABLET | Freq: Two times a day (BID) | ORAL | 3 refills | Status: DC

## 2017-01-01 MED ORDER — HYDROCHLOROTHIAZIDE 12.5 MG PO TABS: 12.5000 mg | ORAL_TABLET | Freq: Every day | ORAL | 3 refills | Status: DC

## 2017-01-01 MED ORDER — SIMVASTATIN 40 MG PO TABS: 40.0000 mg | ORAL_TABLET | Freq: Every day | ORAL | 3 refills | Status: DC

## 2017-01-01 MED ORDER — PIOGLITAZONE HCL 45 MG PO TABS: 45.0000 mg | ORAL_TABLET | Freq: Every day | ORAL | 3 refills | Status: DC

## 2017-01-01 MED ORDER — TAMSULOSIN HCL 0.4 MG PO CAPS: 0.4000 mg | ORAL_CAPSULE | Freq: Every day | ORAL | 3 refills | Status: DC

## 2017-01-01 MED ORDER — RAMIPRIL 5 MG PO CAPS: 5.0000 mg | ORAL_CAPSULE | Freq: Every day | ORAL | 3 refills | Status: DC

## 2017-01-01 NOTE — Assessment & Plan Note (Signed)
Continue current meds, diet, exercise with farming, labs d/w pt.  If low sugars, then cut actos in half and update me.  He agrees.  D/w pt about foot care.  He declined foot clinic eval.  He'll update me as needed.  A1c at goal.

## 2017-01-01 NOTE — Patient Instructions (Signed)
If you have low sugars, then cut actos in half and update me.  If your feet get worse, then let me know.  Add on flomax and see if you can urinate better.  Take care.  Glad to see you.

## 2017-01-01 NOTE — Assessment & Plan Note (Signed)
Continue current meds, diet, exercise with farming, labs d/w pt.

## 2017-01-01 NOTE — Assessment & Plan Note (Signed)
No reason to suspect UTI, d/w pt about bladder outlet sx.  At this point, d/w pt about utility of PSA and DRE- okay to defer both for now.  Would start flomax and he'll update me as needed.  He agrees.

## 2017-01-01 NOTE — Assessment & Plan Note (Signed)
CXR w/o acute changes, continue as is for now.  He agrees.

## 2017-01-01 NOTE — Assessment & Plan Note (Signed)
Continue statin, diet, exercise with farming, labs d/w pt.

## 2017-01-01 NOTE — Assessment & Plan Note (Signed)
Abnormal screenings:  Hearing - failed- declined hearing aids.   Fall risk - hx of fall with injury.  Cautions d/w pt.   Mini-Cog score: prev 18/20.   Rechecked today.  Knows the month, year, day.  Can do math, can read a watch 3/3 recall (with prompting) No red flag events.  He is still working on the farm.  We agreed to follow along for now.

## 2017-01-01 NOTE — Progress Notes (Signed)
Pre visit review using our clinic review tool, if applicable. No additional management support is needed unless otherwise documented below in the visit note. 

## 2017-01-01 NOTE — Progress Notes (Signed)
Abnormal screenings:  Hearing - failed- declined hearing aids.   Fall risk - hx of fall with injury.  Cautions d/w pt.   Mini-Cog score: 18/20.  Recheck today.  Knows the month, year, day.  Can do math, can read a watch 3/3 recall (with prompting) No red flag events.  He is still working on the farm.  We agreed to follow along for now.    H/o throat cancer. See notes on CXR.  No complaints.    Diabetes:  Using medications without difficulties:yes Hypoglycemic episodes: lowest sugar 80, w/o sx.   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  Diet compliance:yes Exercise: farming  LUTS.  No burning but slow stream.  Gradually worse in the last year.    PMH and SH reviewed.   Vital signs, Meds and allergies reviewed.  ROS: Per HPI unless specifically indicated in ROS section   GEN: nad, alert and oriented HEENT: mucous membranes moist NECK: supple w/o LA, chronic changes from prior surgery at baseline.  Stoma CDI CV: rrr. PULM: ctab, no inc wob ABD: soft, +bs EXT: no edema SKIN: no acute rash Mult finger amputations.  He had burned the stump on the R 2nd finger.  Not infected.    Diabetic foot exam: Inspection- No skin breakdown Calluses noted, esp R 1st toe- doesn't appear infected Dec but symmetric DP pulses Normal sensation to light tough and monofilament Nails thickened  D/w pt about foot care, he declined foot clinic eval/tx.  D/w pt.

## 2017-04-05 DIAGNOSIS — Z85828 Personal history of other malignant neoplasm of skin: Secondary | ICD-10-CM | POA: Diagnosis not present

## 2017-04-05 DIAGNOSIS — L738 Other specified follicular disorders: Secondary | ICD-10-CM | POA: Diagnosis not present

## 2017-04-05 DIAGNOSIS — B029 Zoster without complications: Secondary | ICD-10-CM | POA: Diagnosis not present

## 2017-04-16 ENCOUNTER — Other Ambulatory Visit: Payer: Self-pay

## 2017-04-16 NOTE — Telephone Encounter (Signed)
Mrs Mahaffy(do not see DPR) said pt is doing well on flomax; urinating much better and request 90 day refills to [facility] mail order [facility]. Pt was to cb and update Dr Damita Dunnings on how he was doing on tamsulosin.Please advise. Pt's wife also wanted to know about the new shingles vaccine; pt has part D medicare and she will ck with pharmacy to see if have vaccine; Mrs Priola will cb with pharmacy to send Shingrix vaccine to.

## 2017-04-17 MED ORDER — TAMSULOSIN HCL 0.4 MG PO CAPS: 0.4000 mg | ORAL_CAPSULE | Freq: Every day | ORAL | 3 refills | Status: DC

## 2017-04-17 NOTE — Telephone Encounter (Signed)
Noted. Thanks.  Sent rx.   Plan on recheck A1c prior to visit this fall, if not already scheduled.

## 2017-04-26 ENCOUNTER — Other Ambulatory Visit: Payer: Self-pay | Admitting: Family Medicine

## 2017-08-15 ENCOUNTER — Ambulatory Visit (INDEPENDENT_AMBULATORY_CARE_PROVIDER_SITE_OTHER): Payer: Medicare Other

## 2017-08-15 DIAGNOSIS — Z23 Encounter for immunization: Secondary | ICD-10-CM

## 2017-10-31 DIAGNOSIS — H43813 Vitreous degeneration, bilateral: Secondary | ICD-10-CM | POA: Diagnosis not present

## 2017-10-31 DIAGNOSIS — H353131 Nonexudative age-related macular degeneration, bilateral, early dry stage: Secondary | ICD-10-CM | POA: Diagnosis not present

## 2017-10-31 DIAGNOSIS — E119 Type 2 diabetes mellitus without complications: Secondary | ICD-10-CM | POA: Diagnosis not present

## 2017-10-31 DIAGNOSIS — H04123 Dry eye syndrome of bilateral lacrimal glands: Secondary | ICD-10-CM | POA: Diagnosis not present

## 2017-11-26 DIAGNOSIS — I639 Cerebral infarction, unspecified: Secondary | ICD-10-CM

## 2017-11-26 HISTORY — DX: Cerebral infarction, unspecified: I63.9

## 2017-12-09 ENCOUNTER — Inpatient Hospital Stay (HOSPITAL_COMMUNITY): Payer: Medicare Other

## 2017-12-09 ENCOUNTER — Other Ambulatory Visit: Payer: Self-pay

## 2017-12-09 ENCOUNTER — Inpatient Hospital Stay (HOSPITAL_COMMUNITY)
Admission: EM | Admit: 2017-12-09 | Discharge: 2017-12-11 | DRG: 066 | Disposition: A | Discharge status: Home / Self Care | Payer: Medicare Other | Attending: Family Medicine | Admitting: Family Medicine

## 2017-12-09 ENCOUNTER — Encounter (HOSPITAL_COMMUNITY): Payer: Self-pay

## 2017-12-09 ENCOUNTER — Emergency Department (HOSPITAL_COMMUNITY): Payer: Medicare Other

## 2017-12-09 DIAGNOSIS — Z7984 Long term (current) use of oral hypoglycemic drugs: Secondary | ICD-10-CM | POA: Diagnosis not present

## 2017-12-09 DIAGNOSIS — H353131 Nonexudative age-related macular degeneration, bilateral, early dry stage: Secondary | ICD-10-CM | POA: Diagnosis not present

## 2017-12-09 DIAGNOSIS — Z7982 Long term (current) use of aspirin: Secondary | ICD-10-CM | POA: Diagnosis not present

## 2017-12-09 DIAGNOSIS — Z9002 Acquired absence of larynx: Secondary | ICD-10-CM | POA: Diagnosis not present

## 2017-12-09 DIAGNOSIS — R339 Retention of urine, unspecified: Secondary | ICD-10-CM | POA: Diagnosis present

## 2017-12-09 DIAGNOSIS — I482 Chronic atrial fibrillation: Secondary | ICD-10-CM | POA: Diagnosis not present

## 2017-12-09 DIAGNOSIS — E785 Hyperlipidemia, unspecified: Secondary | ICD-10-CM | POA: Diagnosis present

## 2017-12-09 DIAGNOSIS — Z963 Presence of artificial larynx: Secondary | ICD-10-CM | POA: Diagnosis present

## 2017-12-09 DIAGNOSIS — H4921 Sixth [abducent] nerve palsy, right eye: Secondary | ICD-10-CM | POA: Diagnosis not present

## 2017-12-09 DIAGNOSIS — H53451 Other localized visual field defect, right eye: Secondary | ICD-10-CM

## 2017-12-09 DIAGNOSIS — Z89021 Acquired absence of right finger(s): Secondary | ICD-10-CM

## 2017-12-09 DIAGNOSIS — E119 Type 2 diabetes mellitus without complications: Secondary | ICD-10-CM | POA: Diagnosis present

## 2017-12-09 DIAGNOSIS — Z89011 Acquired absence of right thumb: Secondary | ICD-10-CM | POA: Diagnosis not present

## 2017-12-09 DIAGNOSIS — I63432 Cerebral infarction due to embolism of left posterior cerebral artery: Secondary | ICD-10-CM | POA: Diagnosis not present

## 2017-12-09 DIAGNOSIS — I639 Cerebral infarction, unspecified: Secondary | ICD-10-CM | POA: Diagnosis present

## 2017-12-09 DIAGNOSIS — I63332 Cerebral infarction due to thrombosis of left posterior cerebral artery: Secondary | ICD-10-CM | POA: Diagnosis not present

## 2017-12-09 DIAGNOSIS — H53459 Other localized visual field defect, unspecified eye: Secondary | ICD-10-CM | POA: Diagnosis present

## 2017-12-09 DIAGNOSIS — I10 Essential (primary) hypertension: Secondary | ICD-10-CM | POA: Diagnosis not present

## 2017-12-09 DIAGNOSIS — C329 Malignant neoplasm of larynx, unspecified: Secondary | ICD-10-CM | POA: Diagnosis present

## 2017-12-09 DIAGNOSIS — I480 Paroxysmal atrial fibrillation: Secondary | ICD-10-CM

## 2017-12-09 DIAGNOSIS — H53461 Homonymous bilateral field defects, right side: Secondary | ICD-10-CM

## 2017-12-09 DIAGNOSIS — Z79899 Other long term (current) drug therapy: Secondary | ICD-10-CM | POA: Diagnosis not present

## 2017-12-09 DIAGNOSIS — Z8521 Personal history of malignant neoplasm of larynx: Secondary | ICD-10-CM

## 2017-12-09 DIAGNOSIS — I272 Pulmonary hypertension, unspecified: Secondary | ICD-10-CM | POA: Diagnosis present

## 2017-12-09 DIAGNOSIS — H9110 Presbycusis, unspecified ear: Secondary | ICD-10-CM | POA: Diagnosis not present

## 2017-12-09 DIAGNOSIS — R29701 NIHSS score 1: Secondary | ICD-10-CM | POA: Diagnosis present

## 2017-12-09 DIAGNOSIS — R402413 Glasgow coma scale score 13-15, at hospital admission: Secondary | ICD-10-CM | POA: Diagnosis present

## 2017-12-09 DIAGNOSIS — H5461 Unqualified visual loss, right eye, normal vision left eye: Secondary | ICD-10-CM | POA: Diagnosis present

## 2017-12-09 DIAGNOSIS — I4510 Unspecified right bundle-branch block: Secondary | ICD-10-CM | POA: Diagnosis not present

## 2017-12-09 DIAGNOSIS — I4891 Unspecified atrial fibrillation: Secondary | ICD-10-CM | POA: Diagnosis present

## 2017-12-09 DIAGNOSIS — Z87891 Personal history of nicotine dependence: Secondary | ICD-10-CM | POA: Diagnosis not present

## 2017-12-09 DIAGNOSIS — G459 Transient cerebral ischemic attack, unspecified: Secondary | ICD-10-CM | POA: Diagnosis not present

## 2017-12-09 DIAGNOSIS — L97519 Non-pressure chronic ulcer of other part of right foot with unspecified severity: Secondary | ICD-10-CM | POA: Diagnosis not present

## 2017-12-09 DIAGNOSIS — I361 Nonrheumatic tricuspid (valve) insufficiency: Secondary | ICD-10-CM | POA: Diagnosis not present

## 2017-12-09 DIAGNOSIS — I6389 Other cerebral infarction: Secondary | ICD-10-CM | POA: Diagnosis not present

## 2017-12-09 HISTORY — DX: Cerebral infarction, unspecified: I63.9

## 2017-12-09 HISTORY — DX: Malignant neoplasm of larynx, unspecified: C32.9

## 2017-12-09 HISTORY — DX: Homonymous bilateral field defects, right side: H53.461

## 2017-12-09 HISTORY — DX: Type 2 diabetes mellitus without complications: E11.9

## 2017-12-09 HISTORY — DX: Retention of urine, unspecified: R33.9

## 2017-12-09 LAB — DIFFERENTIAL
Basophils Absolute: 0 10*3/uL (ref 0.0–0.1)
Basophils Relative: 0 %
EOS ABS: 0 10*3/uL (ref 0.0–0.7)
EOS PCT: 0 %
LYMPHS ABS: 1.1 10*3/uL (ref 0.7–4.0)
LYMPHS PCT: 14 %
MONOS PCT: 8 %
Monocytes Absolute: 0.6 10*3/uL (ref 0.1–1.0)
NEUTROS PCT: 78 %
Neutro Abs: 6.1 10*3/uL (ref 1.7–7.7)

## 2017-12-09 LAB — I-STAT CHEM 8, ED
BUN: 20 mg/dL (ref 6–20)
Calcium, Ion: 1.2 mmol/L (ref 1.15–1.40)
Chloride: 101 mmol/L (ref 101–111)
Creatinine, Ser: 1.1 mg/dL (ref 0.61–1.24)
Glucose, Bld: 119 mg/dL — ABNORMAL HIGH (ref 65–99)
HEMATOCRIT: 44 % (ref 39.0–52.0)
HEMOGLOBIN: 15 g/dL (ref 13.0–17.0)
Potassium: 4.3 mmol/L (ref 3.5–5.1)
SODIUM: 139 mmol/L (ref 135–145)
TCO2: 26 mmol/L (ref 22–32)

## 2017-12-09 LAB — LIPID PANEL
CHOLESTEROL: 116 mg/dL (ref 0–200)
HDL: 53 mg/dL (ref >40)
LDL Cholesterol: 52 mg/dL (ref 0–99)
TRIGLYCERIDES: 56 mg/dL (ref <150)
Total CHOL/HDL Ratio: 2.2 RATIO
VLDL: 11 mg/dL (ref 0–40)

## 2017-12-09 LAB — COMPREHENSIVE METABOLIC PANEL
ALBUMIN: 3.6 g/dL (ref 3.5–5.0)
ALT: 15 U/L — ABNORMAL LOW (ref 17–63)
ANION GAP: 11 (ref 5–15)
AST: 34 U/L (ref 15–41)
Alkaline Phosphatase: 92 U/L (ref 38–126)
BILIRUBIN TOTAL: 1.3 mg/dL — AB (ref 0.3–1.2)
BUN: 18 mg/dL (ref 6–20)
CALCIUM: 9.7 mg/dL (ref 8.9–10.3)
CO2: 24 mmol/L (ref 22–32)
Chloride: 103 mmol/L (ref 101–111)
Creatinine, Ser: 1.16 mg/dL (ref 0.61–1.24)
GFR calc Af Amer: >60 mL/min (ref >60)
GFR calc non Af Amer: 56 mL/min — ABNORMAL LOW (ref >60)
GLUCOSE: 122 mg/dL — AB (ref 65–99)
POTASSIUM: 4.4 mmol/L (ref 3.5–5.1)
Sodium: 138 mmol/L (ref 135–145)
TOTAL PROTEIN: 7.5 g/dL (ref 6.5–8.1)

## 2017-12-09 LAB — I-STAT TROPONIN, ED: Troponin i, poc: 0.01 ng/mL (ref 0.00–0.08)

## 2017-12-09 LAB — CBC
HEMATOCRIT: 42.6 % (ref 39.0–52.0)
HEMOGLOBIN: 14.3 g/dL (ref 13.0–17.0)
MCH: 35.8 pg — AB (ref 26.0–34.0)
MCHC: 33.6 g/dL (ref 30.0–36.0)
MCV: 106.8 fL — AB (ref 78.0–100.0)
Platelets: 200 10*3/uL (ref 150–400)
RBC: 3.99 MIL/uL — AB (ref 4.22–5.81)
RDW: 12.6 % (ref 11.5–15.5)
WBC: 7.9 10*3/uL (ref 4.0–10.5)

## 2017-12-09 LAB — GLUCOSE, CAPILLARY
Glucose-Capillary: 88 mg/dL (ref 65–99)
Glucose-Capillary: 110 mg/dL — ABNORMAL HIGH (ref 65–99)

## 2017-12-09 LAB — TSH: TSH: 0.622 u[IU]/mL (ref 0.350–4.500)

## 2017-12-09 LAB — HEMOGLOBIN A1C
HEMOGLOBIN A1C: 5.7 % — AB (ref 4.8–5.6)
MEAN PLASMA GLUCOSE: 116.89 mg/dL

## 2017-12-09 LAB — PROTIME-INR
INR: 1.11
Prothrombin Time: 14.2 seconds (ref 11.4–15.2)

## 2017-12-09 LAB — APTT: aPTT: 34 seconds (ref 24–36)

## 2017-12-09 MED ORDER — SIMVASTATIN 40 MG PO TABS: 40.0000 mg | ORAL_TABLET | Freq: Every day | ORAL | Status: DC

## 2017-12-09 MED ORDER — INSULIN ASPART 100 UNIT/ML ~~LOC~~ SOLN: 0.0000 [IU] | Freq: Every day | SUBCUTANEOUS | Status: DC

## 2017-12-09 MED ORDER — ASPIRIN 325 MG PO TABS: 325.0000 mg | ORAL_TABLET | Freq: Every day | ORAL | Status: DC

## 2017-12-09 MED ORDER — STROKE: EARLY STAGES OF RECOVERY BOOK
Freq: Once | Status: AC
  Administered 2017-12-09: 19:00:00
  Filled 2017-12-09: qty 1

## 2017-12-09 MED ORDER — TAMSULOSIN HCL 0.4 MG PO CAPS
0.4000 mg | ORAL_CAPSULE | Freq: Every day | ORAL | Status: DC
  Administered 2017-12-10 – 2017-12-11 (×2): 0.4 mg via ORAL
  Filled 2017-12-09 (×2): qty 1

## 2017-12-09 MED ORDER — SENNOSIDES-DOCUSATE SODIUM 8.6-50 MG PO TABS: 1.0000 | ORAL_TABLET | Freq: Every evening | ORAL | Status: DC | PRN

## 2017-12-09 MED ORDER — ENSURE ENLIVE PO LIQD
237.0000 mL | Freq: Two times a day (BID) | ORAL | Status: DC
  Administered 2017-12-10 – 2017-12-11 (×3): 237 mL via ORAL

## 2017-12-09 MED ORDER — ACETAMINOPHEN 650 MG RE SUPP: 650.0000 mg | RECTAL | Status: DC | PRN

## 2017-12-09 MED ORDER — ACETAMINOPHEN 160 MG/5ML PO SOLN: 650.0000 mg | ORAL | Status: DC | PRN

## 2017-12-09 MED ORDER — SODIUM CHLORIDE 0.9 % IV SOLN
INTRAVENOUS | Status: AC
  Administered 2017-12-09: 22:00:00 via INTRAVENOUS

## 2017-12-09 MED ORDER — INSULIN ASPART 100 UNIT/ML ~~LOC~~ SOLN
0.0000 [IU] | Freq: Three times a day (TID) | SUBCUTANEOUS | Status: DC
  Administered 2017-12-11: 1 [IU] via SUBCUTANEOUS

## 2017-12-09 MED ORDER — ASPIRIN EC 81 MG PO TBEC
81.0000 mg | DELAYED_RELEASE_TABLET | Freq: Every day | ORAL | Status: DC
  Administered 2017-12-09: 81 mg via ORAL
  Filled 2017-12-09: qty 1

## 2017-12-09 MED ORDER — ENOXAPARIN SODIUM 40 MG/0.4ML ~~LOC~~ SOLN
40.0000 mg | SUBCUTANEOUS | Status: DC
  Filled 2017-12-09: qty 0.4

## 2017-12-09 MED ORDER — ATORVASTATIN CALCIUM 80 MG PO TABS
80.0000 mg | ORAL_TABLET | Freq: Every day | ORAL | Status: DC
  Administered 2017-12-09: 80 mg via ORAL
  Filled 2017-12-09: qty 1

## 2017-12-09 MED ORDER — ACETAMINOPHEN 325 MG PO TABS: 650.0000 mg | ORAL_TABLET | ORAL | Status: DC | PRN

## 2017-12-09 NOTE — H&P (Signed)
History and Physical    David [name] WCH:[id number] DOB: [birth year] DOA: 12/09/2017  PCP: Tonia Ghent, MD Patient coming from: home  Chief Complaint: loss of peripheral vision  HPI: David [name] is a delightful 84 y.o. male with medical history significant fibrillation not anticoagulated, diabetes, hypertension, hyperlipidemia, urinary retention, malignancy of larynx presents to the emergency Department chief complaint of loss of peripheral vision. Initial evaluation includes a CT of the head revealing Subacute infarct in left PCA distribution involving occipital lobe and posteromedial temporal lobe. No hemorrhage. [facility] are asked to admit.  Information is obtained from the patient and his wife is at the bedside as well as the chart. Wife reports about 6 months ago patient developed word searching tendencies. She reports he never went to the doctor to have it checked out and she doesn't think it's really improved that much. She also states it has not gotten any worse. Yesterday he developed peripheral vision loss. Associated symptoms include headache. He denies any dizziness syncope or near-syncope. He denies he difficulty chewing swallowing or numbness tingling of his extremities. He denies chest pain palpitation shortness of breath cough lower extremity edema. He denies abdominal pain nausea vomiting diarrhea constipation melena bright red blood per rectum. He does report chronic urinary retention denies dysuria hematuria frequency or urgency. He denies fever chills recent travel or sick contacts.  ED Course: In the emergency department he's afebrile hemodynamically stable and not hypoxic.  Review of Systems: As per HPI otherwise all other systems reviewed and are negative.   Ambulatory Status: Ambulates independently with a not so steady gait according to the wife.  Past Medical History:  Diagnosis Date  . Cancer of neck ([place])   . Diabetes mellitus    Type II  .  History of atrial fibrillation   . Hyperkalemia    with Ramipril dose at 10 mg, K wnl at 5 mg. per day  . Hyperlipidemia   . Hypertension    Pulmonary  . Stroke ([place])   . Urinary retention     Past Surgical History:  Procedure Laterality Date  . 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 CATARACT EXTRACTION  2007  . DOPPLER ECHOCARDIOGRAPHY     Atrial fib EF 60%--01/18/1994, 12/02/02--atrial fib, EF 50-55  . DOPPLER ECHOCARDIOGRAPHY     ra,la,lv,mild dilat.,trace mitral regurg., mild pulm. htn  . PILONIDAL CYST EXCISION    . TOTAL LARYNGECTOMY  ~1993   with left radical neck dissection    Social History   Socioeconomic History  . Marital status: Married    Spouse name: Not on file  . Number of children: 3  . Years of education: Not on file  . Highest education level: Not on file  Social Needs  . Financial resource strain: Not on file  . Food insecurity - worry: Not on file  . Food insecurity - inability: Not on file  . Transportation needs - medical: Not on file  . Transportation needs - non-medical: Not on file  Occupational History  . Occupation: Farmer  Tobacco Use  . Smoking status: Former Research scientist (life sciences)  . Smokeless tobacco: Never Used  Substance and Sexual Activity  . Alcohol use: No    Alcohol/week: 0.0 oz  . Drug use: No  . Sexual activity: Yes  Other Topics Concern  . Not on file  Social History Narrative   Married 20+ years.   Children:  3 initially, 1 died  in childhood   farmer    No Known Allergies  Family History  Problem Relation Age of Onset  . Cirrhosis Father   . Colon cancer Neg Hx   . Prostate cancer Neg Hx     Prior to Admission medications   Medication Sig Start Date End Date Taking? Authorizing Provider  aspirin 325 MG tablet Take 325 mg by mouth daily.    Yes [provider]  Aspirin-Acetaminophen-Caffeine (EXCEDRIN PO) Take 2 tablets by mouth as needed (body aches or headaches).   Yes [provider]  hydrochlorothiazide (HYDRODIURIL) 12.5 MG tablet Take 1 tablet (12.5 mg total) by mouth daily. 01/01/17  Yes Tonia Ghent, MD  metFORMIN (GLUCOPHAGE) 500 MG tablet Take 1 tablet (500 mg total) by mouth 2 (two) times daily with a meal. 01/01/17  Yes Tonia Ghent, MD  pioglitazone (ACTOS) 45 MG tablet Take 1 tablet (45 mg total) by mouth daily. 01/01/17  Yes Tonia Ghent, MD  ramipril (ALTACE) 5 MG capsule Take 1 capsule (5 mg total) by mouth daily. 01/01/17  Yes Tonia Ghent, MD  simvastatin (ZOCOR) 40 MG tablet Take 1 tablet (40 mg total) by mouth at bedtime. 01/01/17  Yes Tonia Ghent, MD  tamsulosin (FLOMAX) 0.4 MG CAPS capsule Take 1 capsule (0.4 mg total) by mouth daily. 04/17/17  Yes Tonia Ghent, MD  tamsulosin (FLOMAX) 0.4 MG CAPS capsule TAKE 1 CAPSULE BY MOUTH EVERY DAY Patient not taking: Reported on 12/09/2017 04/26/17   Tonia Ghent, MD    Physical Exam: Vitals:   12/09/17 1452 12/09/17 1500 12/09/17 1515 12/09/17 1530  BP:  134/71 126/70 (!) 144/95  Pulse: 83   79  Resp: 12 12 13 18   Temp:      TempSrc:      SpO2: 99%   100%  Weight:      Height:         General:  Appears calm and comfortable in no acute distress Eyes:  PERRL, EOMI, normal lids, iris ENT:  grossly normal hearing, lips & tongue, mucous membranes of his mouth are slightly pale slightly dry. Neck:  Chronic trach stoma no erythema or drainage. Light dressing in place Cardiovascular:  Irregularly irregular, no m/r/g. No LE edema.  Respiratory:  CTA bilaterally, no w/r/r. Normal respiratory effort. Abdomen:  soft, ntnd, positive bowel sounds throughout Skin:  no rash or induration seen on limited exam Musculoskeletal:  grossly normal tone BUE/BLE, good ROM, no bony abnormality Psychiatric:  grossly normal mood and affect, speech fluent and appropriate, AOx3 Neurologic:  CN 2-12 grossly intact, moves all extremities in coordinated fashion, sensation intact  Labs on Admission: I have  personally reviewed following labs and imaging studies  CBC: Recent Labs  Lab 12/09/17 1336 12/09/17 1353  WBC 7.9  --   NEUTROABS 6.1  --   HGB 14.3 15.0  HCT 42.6 44.0  MCV 106.8*  --   PLT 200  --    Basic Metabolic Panel: Recent Labs  Lab 12/09/17 1336 12/09/17 1353  NA 138 139  K 4.4 4.3  CL 103 101  CO2 24  --   GLUCOSE 122* 119*  BUN 18 20  CREATININE 1.16 1.10  CALCIUM 9.7  --    GFR: Estimated Creatinine Clearance: 60.8 mL/min (by C-G formula based on SCr of 1.1 mg/dL). Liver Function Tests: Recent Labs  Lab 12/09/17 1336  AST 34  ALT 15*  ALKPHOS 92  BILITOT 1.3*  PROT  7.5  ALBUMIN 3.6   No results for input(s): LIPASE, AMYLASE in the last 168 hours. No results for input(s): AMMONIA in the last 168 hours. Coagulation Profile: Recent Labs  Lab 12/09/17 1336  INR 1.11   Cardiac Enzymes: No results for input(s): CKTOTAL, CKMB, CKMBINDEX, TROPONINI in the last 168 hours. BNP (last 3 results) No results for input(s): PROBNP in the last 8760 hours. HbA1C: No results for input(s): HGBA1C in the last 72 hours. CBG: No results for input(s): GLUCAP in the last 168 hours. Lipid Profile: No results for input(s): CHOL, HDL, LDLCALC, TRIG, CHOLHDL, LDLDIRECT in the last 72 hours. Thyroid Function Tests: No results for input(s): TSH, T4TOTAL, FREET4, T3FREE, THYROIDAB in the last 72 hours. Anemia Panel: No results for input(s): VITAMINB12, FOLATE, FERRITIN, TIBC, IRON, RETICCTPCT in the last 72 hours. Urine analysis: No results found for: COLORURINE, APPEARANCEUR, LABSPEC, PHURINE, GLUCOSEU, HGBUR, BILIRUBINUR, KETONESUR, PROTEINUR, UROBILINOGEN, NITRITE, LEUKOCYTESUR  Creatinine Clearance: Estimated Creatinine Clearance: 60.8 mL/min (by C-G formula based on SCr of 1.1 mg/dL).  Sepsis Labs: @LABRCNTIP (procalcitonin:4,lacticidven:4) )No results found for this or any previous visit (from the past 240 hour(s)).   Radiological Exams on Admission: Ct  Head Wo Contrast  Result Date: 12/09/2017 CLINICAL DATA:  84 y/o  M; vision changes. EXAM: CT HEAD WITHOUT CONTRAST TECHNIQUE: Contiguous axial images were obtained from the base of the skull through the vertex without intravenous contrast. COMPARISON:  None. FINDINGS: Brain: Subacute infarct within the left PCA distribution involving occipital lobe and posteromedial temporal lobe. Mild local mass effect. No hemorrhage. Small chronic lacunar infarction within the left lentiform nucleus extending into periventricular white matter frontal lobe. Moderate chronic microvascular ischemic changes of the brain and parenchymal volume loss. No hydrocephalus, effacement of basilar cisterns, or extra-axial collection. Vascular: Calcific atherosclerosis of carotid and vertebral arteries. No hyperdense vessel identified. Skull: Normal. Negative for fracture or focal lesion. Sinuses/Orbits: No acute finding. Other: Bilateral intra-ocular lens replacement. IMPRESSION: 1. Subacute infarct in left PCA distribution involving occipital lobe and posteromedial temporal lobe. No hemorrhage. 2. Moderate chronic microvascular ischemic changes and parenchymal volume loss of the brain. Small chronic lacunar infarct in left basal ganglia. Electronically Signed   By: Kristine Garbe M.D.   On: 12/09/2017 14:11    EKG: Independently reviewed. Atrial fibrillation with rapid ventricular response with premature ventricular or aberrantly conducted complexes Right bundle branch block  Assessment/Plan Principal Problem:   Stroke [facility]) Active Problems:   Malignant neoplasm of larynx ([facility])   Diabetes type 2, controlled ([place])   Essential hypertension   ATRIAL FIBRILLATION   Urinary retention   Loss of peripheral visual field   #1. Stroke/loss of peripheral vision. Likely related to A. fib and not on anticoagulation. CT of the head reveals subacute left PCA. He states his vision is better since yesterday but the same since he  came to the emergency department. Risk factors include A. fib not on anticoagulation hypertension hyperlipidemia former smoker diabetes. Evaluated by neurology who recommends admission for stroke workup -Admit to telemetry -MRI MRA of the brain -Carotid Doppler -2-D echo -Aspirin and statin -Frequent neuro checks -PT and OT with speech  #2. Atrial fibrillation. Not on anticoagulation. [place] score 5. EKG as noted above. Home meds include asa. -Continue aspirin at lower dose per neuro recommendation -Statin -She continues to refuse anticoagulation  #3. Diabetes. Serum glucose 118 on admission. Home medications include oral agents only. Patient states he checks his blood sugar 2-3 times a week. -Hold oral agents for  now -Obtain a hemoglobin A1c -Sliding scale for optimal control  #4. Hypertension. Fair control in the emergency department. Home medications include ramapril and hydrochlorothiazide. -We'll hold these medications for now -Monitor blood pressure closely -We'll resume home medications as indicated  #5. Urinary retention. Appears stable at baseline -Continue Flomax  6. History of leg mid neoplasm of the larynx. Status post laryngectomy 1993. -stable -communicates via device   DVT prophylaxis: scd  Code Status: full  Family Communication: wife at bedside  Disposition Plan: home   Consults called:  Etta Quill PA Admission status: inpatient    Radene Gunning MD [facility]  If 7PM-7AM, please contact night-coverage [email or web address] Password [email or web address]  12/09/2017, 3:52 PM

## 2017-12-09 NOTE — Consult Note (Addendum)
Requesting Physician: Dr. Maryan Rued    Chief Complaint: hemianopia  History obtained from:  Patient     HPI:                                                                                                                                         David [name] is an 84 y.o. male  with a history of atrial fibrillation not anticoagulated, history of neck cancer, diabetes, hypertension and hyperlipidemia presenting today with peripheral vision loss in both eyes.  Patient started noticing symptoms on Saturday which did not resolve so they saw his doctor today who sent him here for further testing.   Patient does have A. fib but does not want to be on Coumadin or any form of anticoagulation as the damage to him.  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  It has been discussed with him that he is not to drive at this point time as he has a complete right hemianopsia and that it would be extremely dangerous for him to be driving farm equipment and/or a car.  About 6 months ago he also had a period in which he was having difficulty expressing himself which lasted for approximately 20 minutes and then fully resolved.  Long discussion was had with patient about anticoagulation with A. fib and the fact that he is highly functional and the neck stroke that he has could be devastating.  Patient dated he would consider it and think about it overnight.  Of note INR is 1.11    Date last known well: Date: 12/07/2017 Time last known well: Unable to determine tPA Given: No: Out of the window Modified Rankin: Rankin Score=0   Past Medical History:  Diagnosis Date  . Cancer of neck ([place])   . Diabetes mellitus    Type II  . History of atrial fibrillation   . Hyperkalemia    with Ramipril dose at 10 mg, K wnl at 5 mg. per day  . Hyperlipidemia   . Hypertension    Pulmonary    Past Surgical History:  Procedure Laterality  Date  . 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 CATARACT EXTRACTION  2007  . DOPPLER ECHOCARDIOGRAPHY     Atrial fib EF 60%--01/18/1994, 12/02/02--atrial fib, EF 50-55  . DOPPLER ECHOCARDIOGRAPHY     ra,la,lv,mild dilat.,trace mitral regurg., mild pulm. htn  . PILONIDAL CYST EXCISION    . TOTAL LARYNGECTOMY  ~1993   with left radical neck dissection    Family History  Problem Relation Age of Onset  . Cirrhosis Father   . Colon cancer Neg Hx   . Prostate cancer Neg Hx    Social History:  reports that he has quit smoking. he has never used smokeless  tobacco. He reports that he does not drink alcohol or use drugs.  Allergies: No Known Allergies  Medications:                                                                                                                           No current facility-administered medications for this encounter.    Current Outpatient Medications  Medication Sig Dispense Refill  . aspirin 325 MG tablet Take 325 mg by mouth daily.     . Aspirin-Acetaminophen-Caffeine (EXCEDRIN PO) Take 2 tablets by mouth as needed (body aches or headaches).    . hydrochlorothiazide (HYDRODIURIL) 12.5 MG tablet Take 1 tablet (12.5 mg total) by mouth daily. 90 tablet 3  . metFORMIN (GLUCOPHAGE) 500 MG tablet Take 1 tablet (500 mg total) by mouth 2 (two) times daily with a meal. 180 tablet 3  . pioglitazone (ACTOS) 45 MG tablet Take 1 tablet (45 mg total) by mouth daily. 90 tablet 3  . ramipril (ALTACE) 5 MG capsule Take 1 capsule (5 mg total) by mouth daily. 90 capsule 3  . simvastatin (ZOCOR) 40 MG tablet Take 1 tablet (40 mg total) by mouth at bedtime. 90 tablet 3  . tamsulosin (FLOMAX) 0.4 MG CAPS capsule Take 1 capsule (0.4 mg total) by mouth daily. 90 capsule 3  . tamsulosin (FLOMAX) 0.4 MG CAPS capsule TAKE 1 CAPSULE BY MOUTH EVERY DAY (Patient not taking: Reported on 12/09/2017) 30 capsule 0     ROS:                                                                                                                                        History obtained from the patient  General ROS: negative for - chills, fatigue, fever, night sweats, weight gain or weight loss Psychological ROS: negative for - behavioral disorder, hallucinations, memory difficulties, mood swings or suicidal ideation Ophthalmic ROS: Positive for -  loss of vision ENT ROS: negative for - epistaxis, nasal discharge, oral lesions, sore throat, tinnitus or vertigo Allergy and Immunology ROS: negative for - hives or itchy/watery eyes Hematological and Lymphatic ROS: negative for - bleeding problems, bruising or swollen lymph nodes Endocrine ROS: negative for - galactorrhea, hair pattern changes, polydipsia/polyuria or temperature intolerance Respiratory ROS: negative for - cough, hemoptysis, shortness of breath or wheezing Cardiovascular ROS: negative for - chest pain, dyspnea on exertion, edema or  irregular heartbeat Gastrointestinal ROS: negative for - abdominal pain, diarrhea, hematemesis, nausea/vomiting or stool incontinence Genito-Urinary ROS: negative for - dysuria, hematuria, incontinence or urinary frequency/urgency Musculoskeletal ROS: negative for - joint swelling or muscular weakness Neurological ROS: as noted in HPI Dermatological ROS: negative for rash and skin lesion changes  General Examination:                                                                                                      Blood pressure 131/75, pulse 83, temperature 99.5 F (37.5 C), temperature source Oral, resp. rate 12, height 6\' 3"  (1.905 m), weight 90.7 kg (200 lb), SpO2 99 %.  HEENT-  Normocephalic, no lesions, without obvious abnormality.  Normal external eye and conjunctiva.  Normal TM's bilaterally.  Normal auditory canals and external ears. Normal external nose, mucus membranes and septum.  Normal pharynx. Cardiovascular- irregularly irregular rhythm, pulses palpable  throughout   Lungs- chest clear, no wheezing, rales, normal symmetric air entry Abdomen- normal findings: bowel sounds normal Extremities- no edema Lymph-no adenopathy palpable Musculoskeletal-no joint tenderness, deformity or swelling Skin-warm and dry, no hyperpigmentation, vitiligo, or suspicious lesions  Neurological Examination Mental Status: Alert, oriented, thought content appropriate.  Speech fluent without evidence of aphasia.  Able to follow 3 step commands without difficulty. Cranial Nerves: II: Complete right hemianopsia III,IV, VI: ptosis not present, extra-ocular motions intact bilaterally, pupils unequal secondary to having dilatation of his eyes during his ophthalmological exam, round, reactive to light and accommodation V,VII: smile symmetric, facial light touch sensation normal bilaterally VIII: hearing normal bilaterally IX,X: uvula rises symmetrically XI: bilateral shoulder shrug XII: midline tongue extension Motor: Right : Upper extremity   5/5    Left:     Upper extremity   5/5  Lower extremity   5/5     Lower extremity   5/5 Tone and bulk:normal tone throughout; no atrophy noted Sensory: Pinprick and light touch intact throughout, bilaterally Deep Tendon Reflexes: 2+ and symmetric throughout Plantars: Right: downgoing   Left: downgoing Cerebellar: normal finger-to-nose,  Gait: Not tested   Lab Results: Basic Metabolic Panel: Recent Labs  Lab 12/09/17 1336 12/09/17 1353  NA 138 139  K 4.4 4.3  CL 103 101  CO2 24  --   GLUCOSE 122* 119*  BUN 18 20  CREATININE 1.16 1.10  CALCIUM 9.7  --     Liver Function Tests: Recent Labs  Lab 12/09/17 1336  AST 34  ALT 15*  ALKPHOS 92  BILITOT 1.3*  PROT 7.5  ALBUMIN 3.6   No results for input(s): LIPASE, AMYLASE in the last 168 hours. No results for input(s): AMMONIA in the last 168 hours.  CBC: Recent Labs  Lab 12/09/17 1336 12/09/17 1353  WBC 7.9  --   NEUTROABS 6.1  --   HGB 14.3 15.0   HCT 42.6 44.0  MCV 106.8*  --   PLT 200  --     Cardiac Enzymes: No results for input(s): CKTOTAL, CKMB, CKMBINDEX, TROPONINI in the last 168 hours.  Lipid Panel: No results for input(s): CHOL, TRIG, HDL,  CHOLHDL, VLDL, LDLCALC in the last 168 hours.  CBG: No results for input(s): GLUCAP in the last 168 hours.  Microbiology: Results for orders placed or performed in visit on 11/14/12  Fecal occult blood, imunochemical     Status: None   Collection Time: 11/14/12 11:31 AM  Result Value Ref Range Status   Fecal Occult Bld Negative Negative Final    Coagulation Studies: Recent Labs    12/09/17 1336  LABPROT 14.2  INR 1.11    Imaging: Ct Head Wo Contrast  Result Date: 12/09/2017 CLINICAL DATA:  84 y/o  M; vision changes. EXAM: CT HEAD WITHOUT CONTRAST TECHNIQUE: Contiguous axial images were obtained from the base of the skull through the vertex without intravenous contrast. COMPARISON:  None. FINDINGS: Brain: Subacute infarct within the left PCA distribution involving occipital lobe and posteromedial temporal lobe. Mild local mass effect. No hemorrhage. Small chronic lacunar infarction within the left lentiform nucleus extending into periventricular white matter frontal lobe. Moderate chronic microvascular ischemic changes of the brain and parenchymal volume loss. No hydrocephalus, effacement of basilar cisterns, or extra-axial collection. Vascular: Calcific atherosclerosis of carotid and vertebral arteries. No hyperdense vessel identified. Skull: Normal. Negative for fracture or focal lesion. Sinuses/Orbits: No acute finding. Other: Bilateral intra-ocular lens replacement. IMPRESSION: 1. Subacute infarct in left PCA distribution involving occipital lobe and posteromedial temporal lobe. No hemorrhage. 2. Moderate chronic microvascular ischemic changes and parenchymal volume loss of the brain. Small chronic lacunar infarct in left basal ganglia. Electronically Signed   By: Kristine Garbe M.D.   On: 12/09/2017 14:11    Assessment and plan discussed with with attending physician and they are in agreement.   Etta Quill PA-C [facility] [phone]  12/09/2017, 3:10 PM   Assessment: 84 y.o. male with left posterior circulation infarct in the occipital lobe.  Patient has known atrial fibrillation but does not want to be anticoagulated as he is scared that this could cause more damage than benefit. The patient was fully educated regarding risks/benefits with opinion of Neurology team being that his risk of stroke recurrence would be significantly reduced with oral anticoagulation. The patient again stated that he wishes not to be anticoagulated. He has agreed to admission for further assessments.  Stroke Risk Factors - diabetes mellitus, hyperlipidemia and hypertension  Recommend: 1. HgbA1c, fasting lipid panel 2. MRI /MRA of the brain without contrast 3. PT consult, OT consult, Speech consult 4. Echocardiogram and carotid ultrasound 5. 80 mg of Atorvastatin 6. Prophylactic therapy-Antiplatelet med: Aspirin 81 mg daily 7. Risk factor modification 8. Telemetry monitoring 9. Frequent neuro checks 10 NPO until passes stroke swallow screen 11 please page stroke NP  Or  PA  Or MD from 8am -4 pm  as this patient from this time will be  followed by the stroke.   You can look them up on [email or web address]  Password [email or web address]. The patient has been advised not to drive. He has been educated regarding the significantly increased risk of MVA or pedestrian collision should he collide with objects present in the obscured visual field. The patient has agreed not to drive.    Electronically signed: Dr. Kerney Elbe

## 2017-12-09 NOTE — ED Notes (Signed)
Attempted Report 

## 2017-12-09 NOTE — ED Triage Notes (Addendum)
Per Pt, Pt is coming from eye doctor where he was seen for vision changes that started on Saturday. Pt was examined and thought to have had a stroke. Noted to have Right peripheral vision loss of both eyes.

## 2017-12-09 NOTE — ED Provider Notes (Signed)
[name] Provider Note   CSN: [id number] Arrival date & time: 12/09/17  1249     History   Chief Complaint Chief Complaint  Patient presents with  . Visual Field Change    HPI David [name] is a 84 y.o. male.  Patient is an 84 year old male with a history of atrial fibrillation not anticoagulated, history of neck cancer, diabetes, hypertension and hyperlipidemia presenting today with peripheral vision loss in both eyes.  Patient started noticing symptoms on Saturday which did not resolve so they saw his doctor today who sent him here for further testing.  Patient denies any unilateral numbness, weakness, facial droop.  Wife states about 6 months ago he started having difficulty with word finding but he never went to the doctor to get it checked out.  That has not really ever improved.  he denies any dizziness, falling.  No recent medication changes.  Patient does take aspirin daily.   The history is provided by the patient and the spouse.    Past Medical History:  Diagnosis Date  . Cancer of neck ([place])   . Diabetes mellitus    Type II  . History of atrial fibrillation   . Hyperkalemia    with Ramipril dose at 10 mg, K wnl at 5 mg. per day  . Hyperlipidemia   . Hypertension    Pulmonary    Patient Active Problem List   Diagnosis Date Noted  . Healthcare maintenance 01/01/2017  . Lower urinary tract symptoms (LUTS) 01/01/2017  . Tear of biceps muscle 07/17/2016  . Medicare annual wellness visit, initial 10/31/2012  . Malignant neoplasm of larynx ([place]) 09/22/2007  . Diabetes type 2, controlled ([place]) 02/13/2007  . HLD (hyperlipidemia) 02/13/2007  . Essential hypertension 02/13/2007  . ATRIAL FIBRILLATION 02/13/2007    Past Surgical History:  Procedure Laterality Date  . 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 CATARACT EXTRACTION  2007  . DOPPLER ECHOCARDIOGRAPHY     Atrial fib EF 60%--01/18/1994,  12/02/02--atrial fib, EF 50-55  . DOPPLER ECHOCARDIOGRAPHY     ra,la,lv,mild dilat.,trace mitral regurg., mild pulm. htn  . PILONIDAL CYST EXCISION    . TOTAL LARYNGECTOMY  ~1993   with left radical neck dissection       Home Medications    Prior to Admission medications   Medication Sig Start Date End Date Taking? Authorizing Provider  aspirin 325 MG tablet Take 325 mg by mouth daily.    Yes [provider]  Aspirin-Acetaminophen-Caffeine (EXCEDRIN PO) Take 2 tablets by mouth as needed (body aches or headaches).   Yes [provider]  hydrochlorothiazide (HYDRODIURIL) 12.5 MG tablet Take 1 tablet (12.5 mg total) by mouth daily. 01/01/17  Yes Tonia Ghent, MD  metFORMIN (GLUCOPHAGE) 500 MG tablet Take 1 tablet (500 mg total) by mouth 2 (two) times daily with a meal. 01/01/17  Yes Tonia Ghent, MD  pioglitazone (ACTOS) 45 MG tablet Take 1 tablet (45 mg total) by mouth daily. 01/01/17  Yes Tonia Ghent, MD  ramipril (ALTACE) 5 MG capsule Take 1 capsule (5 mg total) by mouth daily. 01/01/17  Yes Tonia Ghent, MD  simvastatin (ZOCOR) 40 MG tablet Take 1 tablet (40 mg total) by mouth at bedtime. 01/01/17  Yes Tonia Ghent, MD  tamsulosin (FLOMAX) 0.4 MG CAPS capsule Take 1 capsule (0.4 mg total) by mouth daily. 04/17/17  Yes Tonia Ghent, MD  tamsulosin (FLOMAX) 0.4 MG CAPS capsule TAKE 1 CAPSULE BY MOUTH EVERY DAY Patient not taking: Reported on 12/09/2017 04/26/17   Tonia Ghent, MD    Family History Family History  Problem Relation Age of Onset  . Cirrhosis Father   . Colon cancer Neg Hx   . Prostate cancer Neg Hx     Social History Social History   Tobacco Use  . Smoking status: Former Research scientist (life sciences)  . Smokeless tobacco: Never Used  Substance Use Topics  . Alcohol use: No    Alcohol/week: 0.0 oz  . Drug use: No     Allergies   Patient has no known allergies.   Review of Systems Review of Systems  All other systems reviewed and are  negative.    Physical Exam Updated Vital Signs BP (!) 154/74 (BP Location: Right Arm)   Pulse 83   Temp 99.5 F (37.5 C) (Oral)   Resp 20   Ht 6\' 3"  (1.905 m)   Wt 90.7 kg (200 lb)   SpO2 98%   BMI 25.00 kg/m   Physical Exam  Constitutional: He is oriented to person, place, and time. He appears well-developed and well-nourished. No distress.  HENT:  Head: Normocephalic and atraumatic.  Mouth/Throat: Oropharynx is clear and moist.  Eyes: Conjunctivae and EOM are normal. Pupils are equal, round, and reactive to light.  Neck: Normal range of motion. Neck supple.  Persistent trach stoma without drainage or discharge.  Cardiovascular: Normal rate, regular rhythm and intact distal pulses.  No murmur heard. Pulmonary/Chest: Effort normal and breath sounds normal. No respiratory distress. He has no wheezes. He has no rales.  Abdominal: Soft. He exhibits no distension. There is no tenderness. There is no rebound and no guarding.  Musculoskeletal: Normal range of motion. He exhibits no edema or tenderness.  Neurological: He is alert and oriented to person, place, and time.  5/5 strength in upper/lower ext.  Pt does not speak much due to using a electronic laryngeal assist device and wife answers most questions so unable to tell if he has aphasia.  Normal gait and no ataxia  Skin: Skin is warm and dry. No rash noted. No erythema.  Psychiatric: He has a normal mood and affect. His behavior is normal.  Nursing note and vitals reviewed.    ED Treatments / Results  Labs (all labs ordered are listed, but only abnormal results are displayed) Labs Reviewed  CBC - Abnormal; Notable for the following components:      Result Value   RBC 3.99 (*)    MCV 106.8 (*)    MCH 35.8 (*)    All other components within normal limits  COMPREHENSIVE METABOLIC PANEL - Abnormal; Notable for the following components:   Glucose, Bld 122 (*)    ALT 15 (*)    Total Bilirubin 1.3 (*)    GFR calc non Af  Amer 56 (*)    All other components within normal limits  I-STAT CHEM 8, ED - Abnormal; Notable for the following components:   Glucose, Bld 119 (*)    All other components within normal limits  PROTIME-INR  APTT  DIFFERENTIAL  I-STAT TROPONIN, ED  CBG MONITORING, ED    EKG  EKG Interpretation  Date/Time:  Monday December 09 2017 13:39:16 EST Ventricular Rate:  101 PR Interval:    QRS Duration: 160 QT Interval:  424 QTC Calculation: 549 R Axis:   84 Text Interpretation:  Atrial fibrillation with rapid ventricular response with premature  ventricular or aberrantly conducted complexes Right bundle branch block No previous tracing Confirmed by Blanchie Dessert [phone]) on 12/09/2017 2:51:22 PM       Radiology Ct Head Wo Contrast  Result Date: 12/09/2017 CLINICAL DATA:  84 y/o  M; vision changes. EXAM: CT HEAD WITHOUT CONTRAST TECHNIQUE: Contiguous axial images were obtained from the base of the skull through the vertex without intravenous contrast. COMPARISON:  None. FINDINGS: Brain: Subacute infarct within the left PCA distribution involving occipital lobe and posteromedial temporal lobe. Mild local mass effect. No hemorrhage. Small chronic lacunar infarction within the left lentiform nucleus extending into periventricular white matter frontal lobe. Moderate chronic microvascular ischemic changes of the brain and parenchymal volume loss. No hydrocephalus, effacement of basilar cisterns, or extra-axial collection. Vascular: Calcific atherosclerosis of carotid and vertebral arteries. No hyperdense vessel identified. Skull: Normal. Negative for fracture or focal lesion. Sinuses/Orbits: No acute finding. Other: Bilateral intra-ocular lens replacement. IMPRESSION: 1. Subacute infarct in left PCA distribution involving occipital lobe and posteromedial temporal lobe. No hemorrhage. 2. Moderate chronic microvascular ischemic changes and parenchymal volume loss of the brain. Small chronic lacunar  infarct in left basal ganglia. Electronically Signed   By: Kristine Garbe M.D.   On: 12/09/2017 14:11    Procedures Procedures (including critical care time)  Medications Ordered in ED Medications - No data to display   Initial Impression / Assessment and Plan / ED Course  I have reviewed the triage vital signs and the nursing notes.  Pertinent labs & imaging results that were available during my care of the patient were reviewed by me and considered in my medical decision making (see chart for details).    Patient presenting today with peripheral bilateral visual field cuts and evidence of subacute infarct in his left PCA.  Patient is not a code stroke his symptoms have been going on since Saturday.  Risk factors include a history of atrial fibrillation and patient is not currently anticoagulated.  Will admit for further care and further evaluation.  Neurology consulted.  Final Clinical Impressions(s) / ED Diagnoses   Final diagnoses:  Cerebrovascular accident (CVA) due to thrombosis of left posterior cerebral artery [facility])    ED Discharge Orders    None       Blanchie Dessert, MD 12/09/17 1453

## 2017-12-09 NOTE — Progress Notes (Signed)
Pt arrived to the unit alert and Oriented X 4  Denies pain   All questions and concern addressed.   Oriented to the equipment in the room. Bed in the lowest position and alarm set.

## 2017-12-10 ENCOUNTER — Inpatient Hospital Stay (HOSPITAL_COMMUNITY): Payer: Medicare Other

## 2017-12-10 DIAGNOSIS — I361 Nonrheumatic tricuspid (valve) insufficiency: Secondary | ICD-10-CM

## 2017-12-10 DIAGNOSIS — H53461 Homonymous bilateral field defects, right side: Secondary | ICD-10-CM

## 2017-12-10 DIAGNOSIS — G459 Transient cerebral ischemic attack, unspecified: Secondary | ICD-10-CM

## 2017-12-10 DIAGNOSIS — E119 Type 2 diabetes mellitus without complications: Secondary | ICD-10-CM

## 2017-12-10 LAB — GLUCOSE, CAPILLARY
GLUCOSE-CAPILLARY: 93 mg/dL (ref 65–99)
Glucose-Capillary: 91 mg/dL (ref 65–99)
Glucose-Capillary: 95 mg/dL (ref 65–99)
Glucose-Capillary: 123 mg/dL — ABNORMAL HIGH (ref 65–99)
Glucose-Capillary: 167 mg/dL — ABNORMAL HIGH (ref 65–99)

## 2017-12-10 LAB — ECHOCARDIOGRAM COMPLETE
HEIGHTINCHES: 75 in
Weight: 3200 oz

## 2017-12-10 MED ORDER — ASPIRIN EC 325 MG PO TBEC
325.0000 mg | DELAYED_RELEASE_TABLET | Freq: Every day | ORAL | Status: DC
  Administered 2017-12-10 – 2017-12-11 (×2): 325 mg via ORAL
  Filled 2017-12-10 (×3): qty 1

## 2017-12-10 MED ORDER — APIXABAN 5 MG PO TABS: 5.0000 mg | ORAL_TABLET | Freq: Two times a day (BID) | ORAL | Status: DC

## 2017-12-10 MED ORDER — SIMVASTATIN 40 MG PO TABS
40.0000 mg | ORAL_TABLET | Freq: Every day | ORAL | Status: DC
  Administered 2017-12-10: 40 mg via ORAL
  Filled 2017-12-10: qty 1

## 2017-12-10 MED ORDER — APIXABAN 5 MG PO TABS
5.0000 mg | ORAL_TABLET | Freq: Two times a day (BID) | ORAL | Status: DC
  Administered 2017-12-11: 5 mg via ORAL
  Filled 2017-12-10: qty 1

## 2017-12-10 NOTE — Care Management Note (Signed)
Case Management Note  Patient Details  Name: David [name] MRN: [id number] Date of Birth: [birth year]  Subjective/Objective:    Pt admitted with CVA. He is from home with spouse.                 Action/Plan: Awaiting PT/OT evaluations. CM following for d/c needs, physician orders.    Expected Discharge Date:                  Expected Discharge Plan:     In-House Referral:     Discharge planning Services     Post Acute Care Choice:    Choice offered to:     DME Arranged:    DME Agency:     HH Arranged:    HH Agency:     Status of Service:  In process, will continue to follow  If discussed at Long Length of Stay Meetings, dates discussed:    Additional Comments:  Pollie Friar, RN 12/10/2017, 1:43 PM

## 2017-12-10 NOTE — Progress Notes (Signed)
Hospitalist progress note   David [name]  TWS:[id number] DOB: 1932-03-22 DOA: 12/09/2017 PCP: Tonia Ghent, MD   Specialists:    Brief Narrative:   60 m prior laryngeal ca, htn, afib not on AC presbycusis, MIni-cog 18/20, chr Foot wounds R 1st Toe  admit from ED with vision loss R eye  Assessment & Plan:   Assessment:  The primary encounter diagnosis was Cerebrovascular accident (CVA) due to thrombosis of left posterior cerebral artery ([place]). Diagnoses of Stroke [facility]) and Urinary retention were also pertinent to this visit.  CVA-PT/OT rec OP neuro rehab-Place don NOAC as per Neuro :LAryngeal ca s/p surgery-ostomy looks good-phonates with device CHr Foot wounds-follow Hld-cont statin simvastatin 40 daily Impaied glucose, A1c 5.7-monitor tredns   DVT prophylaxis: lovenox  Code Status:   full   Family Communication:   none  Disposition Plan: pending therapy eval-possible d/c am   Consultants:    neuro  Procedures:    mir  Ct  Carotid   echo  Antimicrobials:   None    Subjective: Seen ambulating looks good some difficulty realizing which room he was in No pain  Objective: Vitals:   12/09/17 2300 12/10/17 0100 12/10/17 0636 12/10/17 1011  BP: 129/61 122/68 (!) 115/58 (!) 122/59  Pulse: 84 83 80 94  Resp: 18 18 18 17   Temp:   99.4 F (37.4 C) 98.8 F (37.1 C)  TempSrc:   Oral Oral  SpO2: 92% 92% 92% 94%  Weight:      Height:       No intake or output data in the 24 hours ending 12/10/17 1052 Filed Weights   12/09/17 1256  Weight: 90.7 kg (200 lb)    Examination:  eomi ncat No pallor no ict cta b abd soft nt nd  Visual deficit on R side temporal and cannot assess fingers  strngth 5/5 bilaterally-reflexes deferred  Data Reviewed: I have personally reviewed following labs and imaging studies  CBC: Recent Labs  Lab 12/09/17 1336 12/09/17 1353  WBC 7.9  --   NEUTROABS 6.1  --   HGB 14.3 15.0  HCT 42.6 44.0  MCV 106.8*  --   PLT 200  --     Basic Metabolic Panel: Recent Labs  Lab 12/09/17 1336 12/09/17 1353  NA 138 139  K 4.4 4.3  CL 103 101  CO2 24  --   GLUCOSE 122* 119*  BUN 18 20  CREATININE 1.16 1.10  CALCIUM 9.7  --    GFR: Estimated Creatinine Clearance: 60.8 mL/min (by C-G formula based on SCr of 1.1 mg/dL). Liver Function Tests: Recent Labs  Lab 12/09/17 1336  AST 34  ALT 15*  ALKPHOS 92  BILITOT 1.3*  PROT 7.5  ALBUMIN 3.6   No results for input(s): LIPASE, AMYLASE in the last 168 hours. No results for input(s): AMMONIA in the last 168 hours. Coagulation Profile: Recent Labs  Lab 12/09/17 1336  INR 1.11   Cardiac Enzymes: No results for input(s): CKTOTAL, CKMB, CKMBINDEX, TROPONINI in the last 168 hours. CBG: Recent Labs  Lab 12/09/17 1721 12/09/17 2130 12/10/17 0653 12/10/17 0800  GLUCAP 88 110* 91 95   Urine analysis: No results found for: COLORURINE, APPEARANCEUR, LABSPEC, PHURINE, GLUCOSEU, HGBUR, BILIRUBINUR, KETONESUR, PROTEINUR, UROBILINOGEN, NITRITE, LEUKOCYTESUR   Radiology Studies: Reviewed images personally in health database    Scheduled Meds: . apixaban  5 mg Oral BID  . aspirin EC  325 mg Oral Daily  . feeding supplement (ENSURE ENLIVE)  237 mL Oral BID BM  . insulin aspart  0-5 Units Subcutaneous QHS  . insulin aspart  0-9 Units Subcutaneous TID WC  . simvastatin  40 mg Oral QHS  . tamsulosin  0.4 mg Oral Daily   Continuous Infusions: . sodium chloride 50 mL/hr at 12/09/17 2158     LOS: 1 day    Time spent: [place], MD [facility]   If 7PM-7AM, please contact night-coverage [email or web address] Password [facility] 12/10/2017, 10:52 AM

## 2017-12-10 NOTE — Evaluation (Signed)
Speech Language Pathology Evaluation Patient Details Name: David [name] MRN: [id number] DOB: 10-12-1932 Today's Date: 12/10/2017 Time: 0935-1000 SLP Time Calculation (min) (ACUTE ONLY): 25 min  Problem List:  Patient Active Problem List   Diagnosis Date Noted  . Stroke ([place]) 12/09/2017  . Loss of peripheral visual field 12/09/2017  . Urinary retention   . Healthcare maintenance 01/01/2017  . Lower urinary tract symptoms (LUTS) 01/01/2017  . Tear of biceps muscle 07/17/2016  . Medicare annual wellness visit, initial 10/31/2012  . Malignant neoplasm of larynx ([place]) 09/22/2007  . Diabetes type 2, controlled ([place]) 02/13/2007  . HLD (hyperlipidemia) 02/13/2007  . Essential hypertension 02/13/2007  . ATRIAL FIBRILLATION 02/13/2007   Past Medical History:  Past Medical History:  Diagnosis Date  . Cancer of neck ([place])   . History of atrial fibrillation   . Hyperkalemia    with Ramipril dose at 10 mg, K wnl at 5 mg. per day  . Hyperlipidemia   . Hypertension    Pulmonary  . Larynx cancer ([place])   . Right homonymous hemianopsia    [name] 12/09/2017  . Stroke ([place]) 11/2017   peripheral vision loss both eyes/notes 12/09/2017  . Type II diabetes mellitus ([place])   . Urinary retention    Past Surgical History:  Past Surgical History:  Procedure Laterality Date  . AMPUTATION FINGER / THUMB Right    distal amputation x4 fingers (not thumb)   . CATARACT EXTRACTION W/ INTRAOCULAR LENS  IMPLANT, BILATERAL Bilateral 2007  . DOPPLER ECHOCARDIOGRAPHY     Atrial fib EF 60%--01/18/1994, 12/02/02--atrial fib, EF 50-55  . DOPPLER ECHOCARDIOGRAPHY     ra,la,lv,mild dilat.,trace mitral regurg., mild pulm. htn  . PILONIDAL CYST EXCISION    . RADICAL NECK DISSECTION Left ~ 1993  . TOTAL LARYNGECTOMY  ~1993   with left radical neck dissection   HPI:  David Magoon Johnsonis a delightful85 y.o.malewith medical history significantfibrillation not anticoagulated,diabetes, hypertension, hyperlipidemia,  urinary retention, malignancy oflarynxpresents to the emergency Department chief complaint of loss of peripheral vision. Initial evaluation includes a CT of the head revealing Subacute infarct in left PCA distribution involving occipital   Assessment / Plan / Recommendation Clinical Impression   Pt presents with mild-moderate cognitive-linguistic deficits characterized by decreased storage and retrieval of information, impaired executive functioning, and word finding difficulty.  Pt needed min assist for generative naming within a given category as well as to name pictures of objects and mod-max assist for immediate recall on 5 word recall subtest of MoCA standardized cognitive assessment.  Pt with good awareness of his deficits but presented with decreased effort to improve his performance despite being very pleasant.  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  All questions were answered to pt's and wife's satisfaction at this time.      SLP Assessment  SLP Recommendation/Assessment: Patient needs continued Speech Lanaguage Pathology Services SLP Visit Diagnosis: Cognitive communication deficit (R41.841)    Follow Up Recommendations  Home health SLP;Outpatient SLP    Frequency and Duration min 1 x/week         SLP Evaluation Cognition  Overall Cognitive Status: Impaired/Different from baseline Arousal/Alertness: Awake/alert Orientation Level: Oriented X4 Attention: Sustained Sustained Attention: Impaired Sustained Attention Impairment: Verbal basic Memory: Impaired Memory Impairment: Storage deficit Awareness: Appears intact Problem Solving: Appears intact  Executive Function: Decision Making Decision Making: Impaired Decision Making Impairment: Verbal basic;Functional  basic Safety/Judgment: Appears intact       Comprehension  Auditory Comprehension Overall Auditory Comprehension: Appears within functional limits for tasks assessed    Expression Expression Primary Mode of Expression: Verbal Verbal Expression Overall Verbal Expression: Impaired Initiation: No impairment Repetition: No impairment Naming: Impairment Confrontation: Impaired Divergent: 75-100% accurate Other Verbal Expression Comments: word finding deficits   Oral / Motor  Oral Motor/Sensory Function Overall Oral Motor/Sensory Function: Within functional limits Motor Speech Overall Motor Speech: Appears within functional limits for tasks assessed   GO                    Emilio Math 12/10/2017, 10:16 AM

## 2017-12-10 NOTE — Progress Notes (Signed)
Echocardiogram 2D Echocardiogram has been performed.  David [name] 12/10/2017, 11:47 AM

## 2017-12-10 NOTE — Discharge Instructions (Signed)

## 2017-12-10 NOTE — Progress Notes (Signed)
Bilateral carotid duplex completed. 1% to 39% ICA stenosis. Vertebral artery flow is antegrade. [facility], [place] 12/10/2017 11:24 AM

## 2017-12-10 NOTE — Evaluation (Signed)
Physical Therapy Evaluation Patient Details Name: David [name] MRN: [id number] DOB: [birth year] Today's Date: 12/10/2017   History of Present Illness  Pt is an 84 y.o.malewith PMH significant for a-fib (not anticoagulated), HTN, HLD, DM, urinary retention, malignancy oflarynx, who presents to ED 12/09/17 with c/o loss of peripheral vision. MRI showing late acute/early subacute infarction involving left occipital lobe and posteromedial temporal lobe; additional punctate areas of infarction in left posterior thalamus and left crus of fornix; petechial hemorrhage in left occipital lobe.    Clinical Impression  Pt presents with an overall decrease in functional mobility secondary to above. PTA, pt indep with mobility and lives at home with wife. Today, pt able to ambulate with supervision for safety; pt with R visual field cut and decreased awareness (or desire?) to scan to R-side to keep from running into objects. Overall decrease in safety awareness increases pt's risk for falls. Recommend outpatient neuro PT to maximize functional mobility, balance, and independence. Will continue to follow acutely to address established goals.    Follow Up Recommendations Outpatient PT(Neuro)    Equipment Recommendations  None recommended by PT    Recommendations for Other Services       Precautions / Restrictions Precautions Precautions: Fall;Other (comment) Precaution Comments: R visual field cut Restrictions Weight Bearing Restrictions: No      Mobility  Bed Mobility               General bed mobility comments: Ambulating with OT upon arrival  Transfers Overall transfer level: Needs assistance Equipment used: 1 person hand held assist Transfers: Sit to/from Stand Sit to Stand: Supervision         General transfer comment: Supervision for safety  Ambulation/Gait Ambulation/Gait assistance: Supervision Ambulation Distance (Feet): 400 Feet Assistive device: None Gait  Pattern/deviations: Step-through pattern;Decreased stride length;Staggering right Gait velocity: Decreased Gait velocity interpretation: <1.8 ft/sec, indicative of risk for recurrent falls General Gait Details: Slow, slightly unsteady ambulation with supervision for safety; pt with R peripheral vision loss, with decreased awareness to scan to R-side, and bumping into objects on R-side in hallway  Stairs            Wheelchair Mobility    Modified Rankin (Stroke Patients Only) Modified Rankin (Stroke Patients Only) Pre-Morbid Rankin Score: No symptoms Modified Rankin: Moderately severe disability     Balance Overall balance assessment: Needs assistance Sitting-balance support: No upper extremity supported;Feet supported Sitting balance-Leahy Scale: Fair     Standing balance support: No upper extremity supported;During functional activity Standing balance-Leahy Scale: Fair Standing balance comment: Can ambulate with no UE support and supervision for safety             High level balance activites: Side stepping;Backward walking;Direction changes;Turns;Sudden stops;Head turns High Level Balance Comments: Instability noted with stepping over object, but pt able to self-correct             Pertinent Vitals/Pain Pain Assessment: No/denies pain    Home Living Family/patient expects to be discharged to:: Private residence Living Arrangements: Spouse/significant other Available Help at Discharge: Family;Available 24 hours/day Type of Home: House Home Access: Stairs to enter Entrance Stairs-Rails: Right;Left;Can reach both Entrance Stairs-Number of Steps: 4 Home Layout: One level Home Equipment: None      Prior Function Level of Independence: Independent         Comments: ADLs, IADLs, driving, and working his cattle farm     Hand Dominance   Dominant Hand: Right    Extremity/Trunk Assessment   Upper  Extremity Assessment Upper Extremity Assessment:  Generalized weakness    Lower Extremity Assessment Lower Extremity Assessment: Overall WFL for tasks assessed    Cervical / Trunk Assessment Cervical / Trunk Assessment: Kyphotic  Communication   Communication: Expressive difficulties;Other (comment)(Baseline has external voicebox)  Cognition Arousal/Alertness: Awake/alert Behavior During Therapy: WFL for tasks assessed/performed Overall Cognitive Status: Impaired/Different from baseline Area of Impairment: Following commands;Safety/judgement;Awareness;Problem solving                       Following Commands: Follows one step commands with increased time Safety/Judgement: Decreased awareness of safety;Decreased awareness of deficits Awareness: Intellectual Problem Solving: Slow processing;Requires verbal cues;Requires tactile cues General Comments: Pt's wife reporting pt has a stubborn personality. Pt prsenting with decreased safety awarness, problem solving, and reasoning. When returning to pt's room, pt requiring Max verbal, visual, and tactile cues to locate his room. Pt passing his room four times without correcting. Pt also with poor reasoning to understand compensatory tehcniques for visual deficits and reasoning for terminating driving.       General Comments General comments (skin integrity, edema, etc.): Wife leaving at begining of eval. Wife providing all PLOF and home info as well as her concerns about heading home    Exercises     Assessment/Plan    PT Assessment Patient needs continued PT services  PT Problem List Decreased strength;Decreased balance;Decreased mobility;Decreased safety awareness       PT Treatment Interventions DME instruction;Gait training;Stair training;Functional mobility training;Therapeutic activities;Therapeutic exercise;Balance training;Neuromuscular re-education;Patient/family education;Cognitive remediation    PT Goals (Current goals can be found in the Care Plan section)  Acute  Rehab PT Goals Patient Stated Goal: Go home as soon as possible PT Goal Formulation: With patient Time For Goal Achievement: 12/24/17 Potential to Achieve Goals: Good    Frequency Min 3X/week   Barriers to discharge        Co-evaluation               AM-PAC PT "6 Clicks" Daily Activity  Outcome Measure Difficulty turning over in bed (including adjusting bedclothes, sheets and blankets)?: None Difficulty moving from lying on back to sitting on the side of the bed? : None Difficulty sitting down on and standing up from a chair with arms (e.g., wheelchair, bedside commode, etc,.)?: A Little Help needed moving to and from a bed to chair (including a wheelchair)?: A Little Help needed walking in hospital room?: A Little Help needed climbing 3-5 steps with a railing? : A Little 6 Click Score: 20    End of Session Equipment Utilized During Treatment: Gait belt Activity Tolerance: Patient tolerated treatment well Patient left: in chair;with call bell/phone within reach;with chair alarm set Nurse Communication: Mobility status PT Visit Diagnosis: Other abnormalities of gait and mobility (R26.89);Other symptoms and signs involving the nervous system (P82.423)    Time: 5361-4431 PT Time Calculation (min) (ACUTE ONLY): 16 min   Charges:   PT Evaluation $PT Eval Moderate Complexity: 1 Mod     PT G Codes:       Mabeline Caras, PT, DPT Acute Rehab Services  Pager: [phone] 12/10/2017, 5:56 PM

## 2017-12-10 NOTE — Progress Notes (Addendum)
ANTICOAGULATION CONSULT NOTE - Initial Consult  Pharmacy Consult for apixaban Indication: atrial fibrillation  No Known Allergies  Patient Measurements: Height: 6\' 3"  (190.5 cm) Weight: 200 lb (90.7 kg) IBW/kg (Calculated) : 84.5  Vital Signs: Temp: 99.4 F (37.4 C) (01/15 0636) Temp Source: Oral (01/15 0636) BP: 115/58 (01/15 0636) Pulse Rate: 80 (01/15 0636)  Labs: Recent Labs    12/09/17 1336 12/09/17 1353  HGB 14.3 15.0  HCT 42.6 44.0  PLT 200  --   APTT 34  --   LABPROT 14.2  --   INR 1.11  --   CREATININE 1.16 1.10    Estimated Creatinine Clearance: 60.8 mL/min (by C-G formula based on SCr of 1.1 mg/dL).   Medical History: Past Medical History:  Diagnosis Date  . Cancer of neck ([place])   . History of atrial fibrillation   . Hyperkalemia    with Ramipril dose at 10 mg, K wnl at 5 mg. per day  . Hyperlipidemia   . Hypertension    Pulmonary  . Larynx cancer ([place])   . Right homonymous hemianopsia    [name] 12/09/2017  . Stroke ([place]) 11/2017   peripheral vision loss both eyes/notes 12/09/2017  . Type II diabetes mellitus ([place])   . Urinary retention     Medications:  Medications Prior to Admission  Medication Sig Dispense Refill Last Dose  . aspirin 325 MG tablet Take 325 mg by mouth daily.    12/09/2017 at 0730  . Aspirin-Acetaminophen-Caffeine (EXCEDRIN PO) Take 2 tablets by mouth as needed (body aches or headaches).   12/08/2017 at Unknown time  . hydrochlorothiazide (HYDRODIURIL) 12.5 MG tablet Take 1 tablet (12.5 mg total) by mouth daily. 90 tablet 3 12/09/2017 at Unknown time  . metFORMIN (GLUCOPHAGE) 500 MG tablet Take 1 tablet (500 mg total) by mouth 2 (two) times daily with a meal. 180 tablet 3 12/09/2017 at Unknown time  . pioglitazone (ACTOS) 45 MG tablet Take 1 tablet (45 mg total) by mouth daily. 90 tablet 3 12/09/2017 at Unknown time  . ramipril (ALTACE) 5 MG capsule Take 1 capsule (5 mg total) by mouth daily. 90 capsule 3 12/09/2017 at Unknown time   . simvastatin (ZOCOR) 40 MG tablet Take 1 tablet (40 mg total) by mouth at bedtime. 90 tablet 3 12/08/2017 at Unknown time  . tamsulosin (FLOMAX) 0.4 MG CAPS capsule Take 1 capsule (0.4 mg total) by mouth daily. 90 capsule 3 12/09/2017 at Unknown time  . tamsulosin (FLOMAX) 0.4 MG CAPS capsule TAKE 1 CAPSULE BY MOUTH EVERY DAY (Patient not taking: Reported on 12/09/2017) 30 capsule 0 Not Taking at Unknown time    Assessment: 84 y/o male who presented with R vision loss concerning for stroke, work-up in progress. He has a hx Afib but was not on anticoagulation PTA due to concern for more harm than good. CHADS2VASc is 23 (age, HTN, DM, stroke). Pharmacy consulted to begin apixaban for non-valvular Afib. He qualifies for normal dosing with weight >60 kg and SCr 1.1. No bleeding noted, CBC is normal.  Plan:  - Apixaban 5 mg PO bid - Education prior to discharge - Consider decreasing aspirin to 81 mg  - Pharmacy signing off but will continue to follow peripherally   Renold Genta, PharmD, BCPS Clinical Pharmacist Phone for today - [place] - [phone] 12/10/2017 10:02 AM

## 2017-12-10 NOTE — Progress Notes (Signed)
Initial Nutrition Assessment  DOCUMENTATION CODES:   Severe malnutrition in context of chronic illness  INTERVENTION:  1. Ensure Enlive po BID, each supplement provides 350 kcal and 20 grams of protein  NUTRITION DIAGNOSIS:   Severe Malnutrition related to chronic illness as evidenced by severe fat depletion, moderate muscle depletion, severe muscle depletion.  GOAL:   Patient will meet greater than or equal to 90% of their needs  MONITOR:   PO intake, I & O's, Weight trends, Supplement acceptance, Labs, Skin  REASON FOR ASSESSMENT:   Malnutrition Screening Tool    ASSESSMENT:   David [name] is an 84 y.o. male  with a history of atrial fibrillation not anticoagulated, history of neck cancer, diabetes, hypertension and hyperlipidemia presenting today with peripheral vision loss in both eyes.   Spoke with David [name] and wife at bedside. She reports 25 pound/11.1% significant weight loss over the past 6 - 8 months, patient reports he was 280 pounds 5 years ago, now down to 200 pounds. Wife states patient has become picky, does not want to eat many meals she makes. Normally eats a bowl of cereal for breakfast, coffee and a cookie for lunch, and wife cooks for dinner. He does not like our food, has many complaints, wife was to get him food at [place] reviewed Medications reviewed and include:  Insulin    NUTRITION - FOCUSED PHYSICAL EXAM:    Most Recent Value  Orbital Region  Severe depletion  Upper Arm Region  Moderate depletion  Thoracic and Lumbar Region  Severe depletion  Buccal Region  Severe depletion  Temple Region  Severe depletion  Clavicle Bone Region  Severe depletion  Clavicle and Acromion Bone Region  Severe depletion  Scapular Bone Region  Moderate depletion  Dorsal Hand  Moderate depletion  Patellar Region  Mild depletion  Anterior Thigh Region  Mild depletion  Posterior Calf Region  Mild depletion  Edema (RD Assessment)  Moderate  Hair  Reviewed   Eyes  Reviewed  Mouth  Reviewed  Skin  Reviewed  Nails  Reviewed       Diet Order:  Diet heart healthy/carb modified Room service appropriate? Yes; Fluid consistency: Thin  EDUCATION NEEDS:   Education needs have been addressed  Skin:  Skin Assessment: Skin Integrity Issues: Skin Integrity Issues:: Other (Comment) Other: Ecchymosis to R arm  Last BM:  12/09/2017  Height:   Ht Readings from Last 1 Encounters:  12/09/17 6\' 3"  (1.905 m)    Weight:   Wt Readings from Last 1 Encounters:  12/09/17 200 lb (90.7 kg)    Ideal Body Weight:  89.09 kg  BMI:  Body mass index is 25 kg/m.  Estimated Nutritional Needs:   Kcal:  2000-2300 calories (MSJ x1.2-1.3)  Protein:  120-145 grams  Fluid:  2-2.3L  David Anis. David Siefert, MS, RD LDN Inpatient Clinical Dietitian Pager [phone]

## 2017-12-10 NOTE — Progress Notes (Signed)
NEUROHOSPITALISTS STROKE TEAM - DAILY PROGRESS NOTE   ADMISSION HISTORY: David [name] is an 84 y.o. male  with a history of atrial fibrillation not anticoagulated, history of neck cancer, diabetes, hypertension and hyperlipidemia presenting today with peripheral vision loss in both eyes.  Patient started noticing symptoms on Saturday which did not resolve so they saw his doctor today who sent him here for further testing.   Patient does have A. fib but does not want to be on Coumadin or any form of anticoagulation as the damage to him.  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  It has been discussed with him that he is not to drive at this point time as he has a complete right hemianopsia and that it would be extremely dangerous for him to be driving farm equipment and/or a car.  About 6 months ago he also had a period in which he was having difficulty expressing himself which lasted for approximately 20 minutes and then fully resolved.  Long discussion was had with patient about anticoagulation with A. fib and the fact that he is highly functional and the neck stroke that he has could be devastating.  Patient dated he would consider it and think about it overnight.  Date last known well: Date: 12/07/2017 Time last known well: Unable to determine tPA Given: No: Out of the window Modified Rankin: Rankin Score=0  SUBJECTIVE (INTERVAL HISTORY)  Wife is at the bedside. Patient is found laying in bed in NAD. Overall he feels his condition is unchanged. Voices no new complaints. No new events reported overnight. Patient is requesting to go home today. States he first noticed his symptoms on Sunday morning.   OBJECTIVE Lab Results: CBC:  Recent Labs  Lab 12/09/17 1336 12/09/17 1353  WBC 7.9  --   HGB 14.3 15.0  HCT 42.6 44.0  MCV 106.8*  --   PLT 200  --     BMP: Recent Labs  Lab 12/09/17 1336 12/09/17 1353  NA 138 139  K 4.4 4.3  CL 103 101  CO2 24  --   GLUCOSE 122* 119*  BUN 18 20  CREATININE 1.16 1.10  CALCIUM 9.7  --    Liver Function Tests:  Recent Labs  Lab 12/09/17 1336  AST 34  ALT 15*  ALKPHOS 92  BILITOT 1.3*  PROT 7.5  ALBUMIN 3.6   Thyroid Function Studies:  Recent Labs    12/09/17 1804  TSH 0.622   Coagulation Studies:  Recent Labs    12/09/17 1336  APTT 34  INR 1.11   PHYSICAL EXAM Temp:  [98.4 F (36.9 C)-99.4 F (37.4 C)] 98.4 F (36.9 C) (01/15 1414) Pulse Rate:  [61-98] 98 (01/15 1414) Resp:  [12-24] 18 (01/15 1414) BP: (115-159)/(58-95) 123/95 (01/15 1414) SpO2:  [92 %-100 %] 94 % (01/15 1414) General - Well nourished, well developed, in no apparent distress HEENT-  Normocephalic,    Cardiovascular - Regular rate and rhythm  Respiratory -  No wheezing. Abdomen - soft and non-tender,  Extremities- no edema or cyanosis Neurological Examination Mental Status: Alert, oriented, thought content appropriate.  Speech fluent without evidence of aphasia.  Able to follow 3 step commands without difficulty. Cranial Nerves: II: Complete right hemianopsia III,IV, VI: ptosis not present, extra-ocular motions intact bilaterally, pupils unequal secondary to having dilatation of his eyes during his ophthalmological exam, round, reactive to  light and accommodation V,VII: smile symmetric, facial light touch sensation normal bilaterally VIII: hearing normal bilaterally IX,X: uvula rises symmetrically XI: bilateral shoulder shrug XII: midline tongue extension Motor: Right : Upper extremity   5/5    Left:     Upper extremity   5/5  Lower extremity   5/5     Lower extremity   5/5 Tone and bulk:normal tone throughout; no atrophy noted Sensory: Pinprick and light touch intact throughout, bilaterally Deep Tendon Reflexes: 2+ and symmetric throughout Plantars: Right: downgoing   Left:  downgoing Cerebellar: normal finger-to-nose,  Gait: Not tested  IMAGING: I have personally reviewed the radiological images below and agree with the radiology interpretations.  Ct Head Wo Contrast Result Date: 12/09/2017 IMPRESSION: 1. Subacute infarct in left PCA distribution involving occipital lobe and posteromedial temporal lobe. No hemorrhage. 2. Moderate chronic microvascular ischemic changes and parenchymal volume loss of the brain. Small chronic lacunar infarct in left basal ganglia. Electronically Signed   By: Kristine Garbe M.D.   On: 12/09/2017 14:11   MRI/ Mra Head Wo Contrast Result Date: 12/09/2017 IMPRESSION: MRI head: 1. Late acute/early subacute infarction involving left occipital lobe and posteromedial temporal lobe. Additional punctate areas of infarction in left posterior thalamus and left crus of fornix. Petechial hemorrhage in left occipital lobe. No significant mass effect. 2. Moderate chronic microvascular ischemic changes and moderate parenchymal volume loss of the brain. 3. Small chronic lacunar infarct in left basal ganglia. MRI head: Patent anterior and posterior intracranial circulation. No large vessel occlusion, aneurysm, or significant stenosis.   Echocardiogram:                                              PENDING  B/L Carotid U/S:                                                1% to 39% ICA stenosis. Vertebral artery flow is antegrade.     IMPRESSION: David [name] is a 84 y.o. male with PMH of HTN, HLD, DM and  known atrial fibrillation but does not want to be anticoagulated as he is scared that this could cause more damage than benefit. MRI reveals:   Late acute/early subacute infarction Left occipital lobe and posteromedial temporal lobe. punctate areas of infarction in left posterior thalamus and left crus of fornix.   STROKE:  Suspected Etiology: cardioembolic, AFIB not on AC therapy Resultant Symptoms:  right hemianopsia Stroke Risk  Factors: diabetes mellitus, hyperlipidemia and hypertension Other Stroke Risk Factors: Advanced age,Hx stroke  Outstanding Stroke Work-up Studies:     Echocardiogram:                                                    PENDING  12/10/2017 ASSESSMENT:   Neuro exam stable. Denies H/A or new weakness/nubness. Long discussion with patient regarding AC therapy. Patient has agreed to begin therapy. Also discussed the issue of driving with patient. He is not in complete agreement but wife states she will make sure he does not drive on any public roads. Patient  will likely drive on his [address] [place]. Patient likely to be discharged home in AM.  PLAN  12/10/2017: Continue Statin Start Eliquis in AM Frequent neuro checks Telemetry monitoring PT/OT/SLP Ongoing aggressive stroke risk factor management Patient counseled to be compliant with his antithrombotic medications Patient counseled on Lifestyle modifications including, Diet, Exercise, and Stress Follow up with [facility] in 6 weeks Close follow up with PCP and Opthomology  HX OF STROKES: Small chronic lacunar infarct in left basal ganglia  AFIB, CHRONIC: Hx of AFIB not on AC Therapy Eliquis to start tomorrow per Pharmacy dosing  HYPERTENSION: Stable Permissive hypertension (OK if <220/120) for 24-48 hours post stroke and then gradually normalized within 5-7 days. Long term BP goal normotensive. May slowly restart home B/P medications after 48 hours Home Meds: HCTZ, Altace  HYPERLIPIDEMIA:    Component Value Date/Time   CHOL 116 12/09/2017 1336   TRIG 56 12/09/2017 1336   TRIG 85 12/05/2006 1045   HDL 53 12/09/2017 1336   CHOLHDL 2.2 12/09/2017 1336   VLDL 11 12/09/2017 1336   LDLCALC 52 12/09/2017 1336  Home Meds:  Zocor 40 mg LDL  goal < 70 Continued on  Zocor 40 mg daily Continue statin at discharge  DIABETES: Lab Results  Component Value Date   HGBA1C 5.7 (H) 12/09/2017  HgbA1c goal <  7.0  Other Active Problems: Principal Problem:   Stroke [facility]) Active Problems:   Malignant neoplasm of larynx (HCC)   Diabetes type 2, controlled (HCC)   Essential hypertension   ATRIAL FIBRILLATION   Urinary retention   Loss of peripheral visual field    Hospital day # 1 VTE prophylaxis: Eliquis, patient refused SCD's Diet : Diet heart healthy/carb modified Room service appropriate? Yes; Fluid consistency: Thin   FAMILY UPDATES:  family at bedside  TEAM UPDATES: Nita Sells, MD   Prior Home Stroke Medications:  aspirin 325 mg daily  Discharge Stroke Meds:  Please discharge patient on Eliquis (apixaban) daily   Disposition: Final discharge disposition not confirmed Therapy Recs:               PENDING Home Equipment:         PENDING Follow Up:  Follow-up Information    Dennie Bible, NP. Schedule an appointment as soon as possible for a visit in 6 week(s).   Specialty:  Family Medicine Contact information: [address] [place] [address] [phone]          Tonia Ghent, MD -PCP Follow up in 1-2 weeks      Assessment & plan discussed with with attending physician and they are in agreement.    Renie Ora Stroke Neurology Team 12/10/2017 2:41 PM  ATTENDING NOTE: I reviewed above note and agree with the assessment and plan. I have made any additions or clarifications directly to the above note. Pt was seen and examined.   84 year old male with history of A. fib not on AC but on aspirin, neck cancer status post surgery, DM, HTN, HLD admitted for right hemianopia since Sunday morning.  He also had a 6 months ago TIA episode with aphasia for 20 minutes.  Exam showed right hemianopia.  MRI showed left PCA infarct with petechial hemorrhage, and old small left CR infarct.  MRA negative.  Carotid Doppler negative and 2D echo pending.  LDL 52 and A1c 5.7.  Currently patient on aspirin 325 and Zocor.    Due to moderate size  of the stroke and petechial hemorrhage, will start Xarelto tomorrow to avoid risk of hemorrhagic conversion.  Patient also educated on no driving due to hemianopia.  Rosalin Hawking, MD PhD Stroke Neurology 12/10/2017 4:33 PM    To contact Stroke Continuity provider, please refer to [email or web address] After hours, contact General Neurology

## 2017-12-10 NOTE — Evaluation (Addendum)
Occupational Therapy Evaluation Patient Details Name: David [name] MRN: [id number] DOB: [birth year] Today's Date: 12/10/2017    History of Present Illness 84 y.o.malewith medical history significantfibrillation not anticoagulated,diabetes, hypertension, hyperlipidemia, urinary retention, malignancy oflarynxpresents to the emergency Department chief complaint of loss of peripheral vision. MRI showing Late acute/early subacute infarction involving left occipital lobe and posteromedial temporal lobe. Additional punctate areas of infarction in left posterior thalamus and left crus of fornix. 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 Pt currently requiring Min A for LB ADLs and functional mobility. Pt presenting with poor balance, R visual field cut, and decreased cognition as seen in poor awareness, problem solving, and judgment. Pt would benefit from further acute OT to address visual field deficits and provide education on compensatory techniques for safety during ADLs; pt would benefit with family present during education to increase carry over. Recommend dc to outpatient neuro OT to increase safety and independence with ADLs and functional mobility.     Follow Up Recommendations  Outpatient OT;Supervision/Assistance - 24 hour(Neuro Outpatient OT)    Equipment Recommendations  3 in 1 bedside commode(As shower seat)    Recommendations for Other Services PT consult     Precautions / Restrictions Precautions Precautions: Fall;Other (comment) Precaution Comments: R visual field cut Restrictions Weight Bearing Restrictions: No      Mobility Bed Mobility               General bed mobility comments: At EOB upon arrival  Transfers Overall transfer level: Needs assistance Equipment used: 1 person hand held assist Transfers: Sit to/from  Stand Sit to Stand: Min assist         General transfer comment: No physical A to power into standing. Upon standing, pt grabbing therapist to stabilize. When asked if he felt unsteady, pt reporting "no" despite obvious LOB and instability    Balance Overall balance assessment: Needs assistance Sitting-balance support: No upper extremity supported;Feet supported Sitting balance-Leahy Scale: Fair     Standing balance support: No upper extremity supported;During functional activity Standing balance-Leahy Scale: Poor Standing balance comment: Requiring physical A and UE support to stabilize in standing                           ADL either performed or assessed with clinical judgement   ADL Overall ADL's : Needs assistance/impaired Eating/Feeding: Set up;Supervision/ safety;Cueing for sequencing;Sitting   Grooming: Min guard;Wash/dry hands;Standing;Cueing for compensatory techniques Grooming Details (indicate cue type and reason): Min Guard for safety. Cues for R visual field deficits Upper Body Bathing: Min guard;Sitting   Lower Body Bathing: Sit to/from stand;Minimal assistance Lower Body Bathing Details (indicate cue type and reason): Min A for dynmic balance Upper Body Dressing : Min guard;Sitting   Lower Body Dressing: Sit to/from stand;Minimal assistance Lower Body Dressing Details (indicate cue type and reason): Min A for dynamic balance Toilet Transfer: Min guard;Ambulation(Simulated to recliner)           Functional mobility during ADLs: Minimal assistance General ADL Comments: Pt presenting with poor balance, decreased cognition, and visual field deficit. Pt bumping into objects on R side without any recognition despite VCs.       Vision Baseline Vision/History: Wears glasses Wears Glasses: At all times Patient Visual Report: Peripheral vision impairment Vision Assessment?: Yes;Vision impaired- to be  further tested in functional context Eye Alignment:  Within Functional Limits Tracking/Visual Pursuits: Decreased smoothness of horizontal tracking;Decreased smoothness of vertical tracking;Unable to hold eye position out of midline;Requires cues, head turns, or add eye shifts to track(Pt with poor tracking and required Max cues) Convergence: Impaired (comment) Visual Fields: Right homonymous hemianopsia;Right visual field deficit;Impaired-to be further tested in functional context Additional Comments: Pt with R visual field cut and R inattention. Pt with poor tracking and unable to maintian gaze out of midline.     Perception     Praxis      Pertinent Vitals/Pain Pain Assessment: No/denies pain     Hand Dominance Right   Extremity/Trunk Assessment Upper Extremity Assessment Upper Extremity Assessment: Generalized weakness   Lower Extremity Assessment Lower Extremity Assessment: Defer to PT evaluation   Cervical / Trunk Assessment Cervical / Trunk Assessment: Kyphotic   Communication Communication Communication: Expressive difficulties;Other (comment)(Has external voice box at baseline)   Cognition Arousal/Alertness: Awake/alert Behavior During Therapy: WFL for tasks assessed/performed Overall Cognitive Status: Impaired/Different from baseline Area of Impairment: Following commands;Safety/judgement;Awareness;Problem solving                       Following Commands: Follows one step commands with increased time Safety/Judgement: Decreased awareness of safety;Decreased awareness of deficits Awareness: Intellectual(Poor problem recognition) Problem Solving: Slow processing;Requires verbal cues;Requires tactile cues General Comments: Pt's wife reporting pt has a stubborn personality. Pt prsenting with decreased safety awarness, problem solving, and reasoning. When returning to pt's room, pt requiring Max verbal, visual, and tactile cues to locate his room. Pt passing his room four times without correcting. Pt also with poor  reasoning to understand compensatory tehcniques for visual deficits and reasoning for terminating driving.    General Comments  Wife leaving at 89 of eval. Wife providing all PLOF and home info as well as her concerns about heading home    Exercises     Shoulder Instructions      Home Living Family/patient expects to be discharged to:: Private residence Living Arrangements: Spouse/significant other Available Help at Discharge: Family;Available 24 hours/day Type of Home: House Home Access: Stairs to enter [employer] of Steps: 4 Entrance Stairs-Rails: Right;Left;Can reach both Home Layout: One level     Bathroom Shower/Tub: Tub/shower unit;Walk-in shower;Door   Bathroom Toilet: Standard(Comfort height)     Home Equipment: None          Prior Functioning/Environment Level of Independence: Independent        Comments: ADLs, IADLs, driving, and working his cattle farm        OT Problem List: Decreased activity tolerance;Impaired balance (sitting and/or standing);Decreased cognition;Decreased safety awareness;Decreased knowledge of use of DME or AE;Decreased knowledge of precautions;Impaired vision/perception      OT Treatment/Interventions: Self-care/ADL training;Therapeutic exercise;Energy conservation;DME and/or AE instruction;Therapeutic activities;Patient/family education    OT Goals(Current goals can be found in the care plan section) Acute Rehab OT Goals Patient Stated Goal: Go home as soon as possible OT Goal Formulation: With patient Time For Goal Achievement: 12/24/17 Potential to Achieve Goals: Good ADL Goals Pt Will Perform Grooming: with set-up;with supervision;standing(2-3 for locating items on R side) Pt Will Perform Lower Body Dressing: with modified independence;sit to/from stand Pt Will Transfer to Toilet: with modified independence;ambulating;regular height toilet Pt Will Perform Tub/Shower Transfer: Shower transfer;ambulating;3 in  1;with set-up;with supervision Additional ADL Goal #1: Pt will verbalize three vision compensatory techniques with 2-3 VCs.  OT Frequency: Min 3X/week   Barriers to D/C:  Co-evaluation              AM-PAC PT "6 Clicks" Daily Activity     Outcome Measure Help from another person eating meals?: A Little Help from another person taking care of personal grooming?: A Little Help from another person toileting, which includes using toliet, bedpan, or urinal?: A Little Help from another person bathing (including washing, rinsing, drying)?: A Little Help from another person to put on and taking off regular upper body clothing?: A Little Help from another person to put on and taking off regular lower body clothing?: A Little 6 Click Score: 18   End of Session Equipment Utilized During Treatment: Gait belt Nurse Communication: Mobility status;Precautions  Activity Tolerance: Patient tolerated treatment well Patient left: in chair;with call bell/phone within reach;with chair alarm set  OT Visit Diagnosis: Unsteadiness on feet (R26.81);Other abnormalities of gait and mobility (R26.89);Muscle weakness (generalized) (M62.81);Low vision, both eyes (H54.2);Other symptoms and signs involving cognitive function                Time: 8887-5797 OT Time Calculation (min): 24 min Charges:  OT General Charges $OT Visit: 1 Visit OT Evaluation $OT Eval Moderate Complexity: 1 Mod G-Codes:     [place] MSOT, OTR/L Acute Rehab Pager: [phone] Office: [place] 12/10/2017, 5:27 PM

## 2017-12-11 DIAGNOSIS — I63432 Cerebral infarction due to embolism of left posterior cerebral artery: Secondary | ICD-10-CM

## 2017-12-11 LAB — GLUCOSE, CAPILLARY: GLUCOSE-CAPILLARY: 146 mg/dL — AB (ref 65–99)

## 2017-12-11 MED ORDER — APIXABAN 5 MG PO TABS: 5.0000 mg | ORAL_TABLET | Freq: Two times a day (BID) | ORAL | 11 refills | Status: DC

## 2017-12-11 NOTE — Progress Notes (Signed)
Discharge instructions reviewed with patient/family. All questions answered at this time. Equipment at bedside. Transport home by family.   Ave Filter, RN

## 2017-12-11 NOTE — Progress Notes (Signed)
NEUROHOSPITALISTS STROKE TEAM - DAILY PROGRESS NOTE   SUBJECTIVE (INTERVAL HISTORY)  Wife is at the bedside. Patient is sitting at edge of bed in NAD. Overall he feels his condition is unchanged. Voices no new complaints. No new events reported overnight. Patient is again requesting to go home today.     OBJECTIVE Lab Results: CBC:  Recent Labs  Lab 12/09/17 1336 12/09/17 1353  WBC 7.9  --   HGB 14.3 15.0  HCT 42.6 44.0  MCV 106.8*  --   PLT 200  --    BMP: Recent Labs  Lab 12/09/17 1336 12/09/17 1353  NA 138 139  K 4.4 4.3  CL 103 101  CO2 24  --   GLUCOSE 122* 119*  BUN 18 20  CREATININE 1.16 1.10  CALCIUM 9.7  --    Liver Function Tests:  Recent Labs  Lab 12/09/17 1336  AST 34  ALT 15*  ALKPHOS 92  BILITOT 1.3*  PROT 7.5  ALBUMIN 3.6   Thyroid Function Studies:  Recent Labs    12/09/17 1804  TSH 0.622   Coagulation Studies:  Recent Labs    12/09/17 1336  APTT 34  INR 1.11   PHYSICAL EXAM Temp:  [98.1 F (36.7 C)-99.3 F (37.4 C)] 98.1 F (36.7 C) (01/16 0908) Pulse Rate:  [75-98] 75 (01/16 0908) Resp:  [16-20] 20 (01/16 0908) BP: (102-131)/(65-95) 117/65 (01/16 0908) SpO2:  [93 %-96 %] 93 % (01/16 0908) General - Well nourished, well developed, in no apparent distress HEENT-  Normocephalic,    Cardiovascular - Regular rate and rhythm  Respiratory -  No wheezing. Abdomen - soft and non-tender,  Extremities- no edema or cyanosis Neurological Examination Mental Status: Alert, oriented, thought content appropriate.  Speech fluent without evidence of aphasia.  Able to follow 3 step commands without difficulty. Cranial Nerves: II: Complete right hemianopsia III,IV, VI: ptosis not present, extra-ocular motions intact bilaterally, pupils unequal secondary to having dilatation of his eyes during his ophthalmological exam, round, reactive to light and accommodation V,VII: smile symmetric,  facial light touch sensation normal bilaterally VIII: hearing normal bilaterally IX,X: uvula rises symmetrically XI: bilateral shoulder shrug XII: midline tongue extension Motor: Right : Upper extremity   5/5    Left:     Upper extremity   5/5  Lower extremity   5/5     Lower extremity   5/5 Tone and bulk:normal tone throughout; no atrophy noted Sensory: Pinprick and light touch intact throughout, bilaterally Deep Tendon Reflexes: 2+ and symmetric throughout Plantars: Right: downgoing   Left: downgoing Cerebellar: normal finger-to-nose,  Gait: Not tested  IMAGING: I have personally reviewed the radiological images below and agree with the radiology interpretations.  Ct Head Wo Contrast Result Date: 12/09/2017 IMPRESSION: 1. Subacute infarct in left PCA distribution involving occipital lobe and posteromedial temporal lobe. No hemorrhage. 2. Moderate chronic microvascular ischemic changes and parenchymal volume loss of the brain. Small chronic lacunar infarct in left basal ganglia. Electronically Signed   By: Kristine Garbe M.D.   On: 12/09/2017 14:11   MRI/ Mra Head Wo Contrast Result Date: 12/09/2017 IMPRESSION: MRI head: 1. Late acute/early subacute infarction involving left occipital lobe and posteromedial temporal lobe. Additional punctate areas of infarction in left posterior thalamus and left crus of fornix. Petechial hemorrhage in left occipital lobe. No significant mass effect. 2. Moderate chronic microvascular ischemic changes and moderate parenchymal volume loss of the brain. 3. Small chronic lacunar infarct in left basal ganglia. MRI head: Patent  anterior and posterior intracranial circulation. No large vessel occlusion, aneurysm, or significant stenosis.   Echocardiogram:                                               Impressions: - The patient was in atrial fibrillation. Normal LV size with EF   50-55% (low normal to mildly reduced systolic function). Moderate    LAE, severe RAE. The RV was mildly dilated with mildly decreased   systolic function. Moderate pulmonary hypertension. Dilated IVC   suggestive of elevated RV filling pressure.  B/L Carotid U/S:                                                1% to 39% ICA stenosis. Vertebral artery flow is antegrade.     IMPRESSION: [name] is a 84 y.o. male with PMH of HTN, HLD, DM and  known atrial fibrillation but does not want to be anticoagulated as he is scared that this could cause more damage than benefit. MRI reveals:   Late acute/early subacute infarction Left occipital lobe and posteromedial temporal lobe. punctate areas of infarction in left posterior thalamus and left crus of fornix.   STROKE:  Suspected Etiology: cardioembolic, AFIB not on AC therapy Resultant Symptoms:  right hemianopsia Stroke Risk Factors: diabetes mellitus, hyperlipidemia and hypertension Other Stroke Risk Factors: Advanced age,Hx stroke  Outstanding Stroke Work-up Studies:    Work up completed                                         12/10/2017: Neuro exam stable. Denies H/A or new weakness/nubness. Long discussion with patient regarding AC therapy. Patient has agreed to begin therapy. Also discussed the issue of driving with patient. He is not in complete agreement but wife states she will make sure he does not drive on any public roads. Patient will likely drive on his [place]. Patient likely to be discharged home in AM. Due to moderate size of the stroke and petechial hemorrhage, Will start eliquis tomorrow to avoid risk of hemorrhagic conversion.  Patient also educated on no driving due to hemianopia.  12/11/2017 ASSESSMENT:   ECHO results noted. No further work up needed. Patient ambulating in hallway with PT/OT. States he is ready to go home. Eliquis to start today. Close follow up with PCP, Opthalmology and Neurology  PLAN  12/11/2017: Continue Statin Start Eliquis today per  pharmacy Discharge home today with family Ongoing aggressive stroke risk factor management Patient counseled to be compliant with his antithrombotic medications Patient counseled on Lifestyle modifications including, Diet, Exercise, and Stress Follow up with [facility] in 6 weeks Close follow up with PCP and Opthomology  HX OF STROKES: Small chronic lacunar infarct in left basal ganglia  AFIB, CHRONIC: Hx of AFIB not on AC Therapy Eliquis to start today per Pharmacy dosing  HYPERTENSION: Stable Permissive hypertension (OK if <220/120) for 24-48 hours post stroke and then gradually normalized within 5-7 days. Long term BP goal normotensive. May slowly restart home B/P medications after 48 hours Home Meds: HCTZ, Altace  HYPERLIPIDEMIA:  Component Value Date/Time   CHOL 116 12/09/2017 1336   TRIG 56 12/09/2017 1336   TRIG 85 12/05/2006 1045   HDL 53 12/09/2017 1336   CHOLHDL 2.2 12/09/2017 1336   VLDL 11 12/09/2017 1336   LDLCALC 52 12/09/2017 1336  Home Meds:  Zocor 40 mg LDL  goal < 70 Continued on  Zocor 40 mg daily Continue statin at discharge  DIABETES: Lab Results  Component Value Date   HGBA1C 5.7 (H) 12/09/2017  HgbA1c goal < 7.0  Other Active Problems: Principal Problem:   Stroke [facility]) Active Problems:   Malignant neoplasm of larynx (HCC)   Diabetes type 2, controlled ([place])   Essential hypertension   ATRIAL FIBRILLATION   Urinary retention   Loss of peripheral visual field   Hemianopia, homonymous, right    Hospital day # 2 VTE prophylaxis: Eliquis, patient refused SCD's Diet : Diet heart healthy/carb modified Room service appropriate? Yes; Fluid consistency: Thin Diet - low sodium heart healthy   FAMILY UPDATES:  family at bedside  TEAM UPDATES: No att. providers found   Prior Home Stroke Medications:  aspirin 325 mg daily  Discharge Stroke Meds:  Please discharge patient on Eliquis (apixaban) daily   Disposition: 01-Home  or Self Care Therapy Recs:               HOME with Outpatient PT/OT/SLP Home Equipment:         TBD Follow Up:  Follow-up Information    Dennie Bible, NP. Schedule an appointment as soon as possible for a visit in 6 week(s).   Specialty:  Family Medicine Contact information: [address] [place] [address] [place] [facility].   Specialty:  Rehabilitation Why:  They will contact you for the first appointment. Contact information: [address] [place]         Tonia Ghent, MD -PCP Follow up in 1-2 weeks      Assessment & plan discussed with with attending physician and they are in agreement.    Mary Sella, ANP-C Stroke Neurology Team 12/11/2017 1:28 PM  ATTENDING NOTE: I reviewed above note and agree with the assessment and plan. I have made any additions or clarifications directly to the above note. Pt was seen and examined.   84 year old male with history of A. fib not on AC but on aspirin, neck cancer status post surgery, DM, HTN, HLD admitted for right hemianopia since Sunday morning.  He also had a 6 months ago TIA episode with aphasia for 20 minutes.  Exam showed right hemianopia.  MRI showed left PCA infarct with petechial hemorrhage, and old small left CR infarct.  MRA negative.  Carotid Doppler negative and EF 50-55%.  LDL 52 and A1c 5.7.  Will start eliquis today. Patient also educated on no driving due to hemianopia and close follow up with ophthalmology.  Neurology will sign off. Please call with questions. Pt will follow up with Cecille Rubin, NP, at [facility] in about 6 weeks. Thanks for the consult.  Rosalin Hawking, MD PhD Stroke Neurology 12/11/2017 1:28 PM    To contact Stroke Continuity provider, please refer to [email or web address] After hours, contact General Neurology

## 2017-12-11 NOTE — Care Management Note (Signed)
Case Management Note  Patient Details  Name: David [name] MRN: [id number] Date of Birth: Nov 06, 1932  Subjective/Objective:                    Action/Plan: Pt discharging home with his wife. CM consulted for outpatient therapy and Eliquis benefits check.  CM submitted Eliquis benefits check this am. CM provided the patient and his wife the 30 day free card. Awaiting benefits check results. CM encouraged them to asked pharmacy about the cost after the first 30 days if no results from benefits check . They voiced understanding.  Pt with orders for 3 in 1. Butch Penny with [facility] DME notified and will deliver the equipment to the room.  CM discussed outpatient therapy. They would like to go to [facility]. Orders in Epic and information on the AVS.  Wife to provide transportation home.    Expected Discharge Date:  12/11/17               Expected Discharge Plan:  OP Rehab  In-House Referral:     Discharge planning Services  CM Consult(Eliquis card)  Post Acute Care Choice:    Choice offered to:  Patient, Spouse  DME Arranged:    DME Agency:     HH Arranged:    [place] Agency:     Status of Service:  Completed, signed off  If discussed at [place] of Stay Meetings, dates discussed:    Additional Comments:  Pollie Friar, RN 12/11/2017, 10:56 AM

## 2017-12-11 NOTE — Progress Notes (Signed)
Physical Therapy Treatment Patient Details Name: David [name] MRN: [id number] DOB: 1932-07-31 Today's Date: 12/11/2017    History of Present Illness Pt is an 84 y.o.malewith PMH significant for a-fib (not anticoagulated), HTN, HLD, DM, urinary retention, malignancy oflarynx, who presents to ED 12/09/17 with c/o loss of peripheral vision. MRI showing late acute/early subacute infarction involving left occipital lobe and posteromedial temporal lobe; additional punctate areas of infarction in left posterior thalamus and left crus of fornix; petechial hemorrhage in left occipital lobe.    PT Comments    Pt is making progress towards his goals, however continues to be limited in his safe mobility by R sided visual field deficit and R sided inattention. Pt is currently supervision for transfers and min guard for ambulation of 400 feet and ascent/descent of 5 steps. Pt would benefit from RW for increased safety with self correction of instability however pt continues to refuse. Pt requires skilled neuro outpatient PT to progress balance training and to address R sided inattention. PT will continue to follow acutely until discharge.    Follow Up Recommendations  Outpatient PT(Neuro)     Equipment Recommendations  None recommended by PT    Recommendations for Other Services       Precautions / Restrictions Precautions Precautions: Fall;Other (comment) Precaution Comments: R visual field cut Restrictions Weight Bearing Restrictions: No    Mobility  Bed Mobility               General bed mobility comments: seated EoB on entry  Transfers Overall transfer level: Needs assistance Equipment used: None Transfers: Sit to/from Stand Sit to Stand: Supervision         General transfer comment: Supervision for safety  Ambulation/Gait Ambulation/Gait assistance: Min guard Ambulation Distance (Feet): 400 Feet Assistive device: None Gait Pattern/deviations: Step-through  pattern;Decreased stride length;Staggering right;Narrow base of support;Scissoring Gait velocity: Decreased Gait velocity interpretation: Below normal speed for age/gender General Gait Details: hands on min guard for safety, no overt LoB, however 2x scissoring, and decreased BoS increasing instability, vc for increasing BoS, and increased head turns to R to scan for obstacles in visual field deficit, however with head turn with gait increase pt instability    Stairs Stairs: Yes   Stair Management: Two rails;Forwards;Step to pattern Number of Stairs: 5 General stair comments: pt missed R handrail with reaching out and required 2 attempts before he could place it on the handrail, slow, steady ascent/descent of steps once R hand placed     Balance Overall balance assessment: Needs assistance Sitting-balance support: No upper extremity supported;Feet supported Sitting balance-Leahy Scale: Fair     Standing balance support: No upper extremity supported;During functional activity Standing balance-Leahy Scale: Fair Standing balance comment: Can ambulate with no UE support              High level balance activites: Head turns High Level Balance Comments: instability with head turns able to correct without assist            Cognition Arousal/Alertness: Awake/alert Behavior During Therapy: WFL for tasks assessed/performed Overall Cognitive Status: Impaired/Different from baseline Area of Impairment: Following commands;Safety/judgement;Awareness;Problem solving                       Following Commands: Follows multi-step commands with increased time Safety/Judgement: Decreased awareness of safety;Decreased awareness of deficits Awareness: Emergent Problem Solving: Slow processing;Requires verbal cues;Requires tactile cues General Comments: Pt demonstrating increased awarness of deficits and performing compensatory tehcniques with Mod-Max VCs.  General  Comments General comments (skin integrity, edema, etc.): wife present throughout and educated on need for supervision with mobility       Pertinent Vitals/Pain Pain Assessment: No/denies pain           PT Goals (current goals can now be found in the care plan section) Acute Rehab PT Goals Patient Stated Goal: Go home as soon as possible PT Goal Formulation: With patient Time For Goal Achievement: 12/24/17 Potential to Achieve Goals: Good Progress towards PT goals: Progressing toward goals    Frequency    Min 3X/week      PT Plan Current plan remains appropriate       AM-PAC PT "6 Clicks" Daily Activity  Outcome Measure  Difficulty turning over in bed (including adjusting bedclothes, sheets and blankets)?: None Difficulty moving from lying on back to sitting on the side of the bed? : None Difficulty sitting down on and standing up from a chair with arms (e.g., wheelchair, bedside commode, etc,.)?: A Little Help needed moving to and from a bed to chair (including a wheelchair)?: A Little Help needed walking in hospital room?: A Little Help needed climbing 3-5 steps with a railing? : A Little 6 Click Score: 20    End of Session Equipment Utilized During Treatment: Gait belt Activity Tolerance: Patient tolerated treatment well Patient left: Other (comment)(handed off to OT in hallway ) Nurse Communication: Mobility status PT Visit Diagnosis: Other abnormalities of gait and mobility (R26.89);Other symptoms and signs involving the nervous system (P50.932)     Time: 6712-4580 PT Time Calculation (min) (ACUTE ONLY): 20 min  Charges:  $Gait Training: 8-22 mins                    G Codes:       Jule Whitsel B. Migdalia Dk PT, DPT Acute Rehabilitation  [phone] Pager [phone]     [place] 12/11/2017, 11:28 AM

## 2017-12-11 NOTE — Discharge Summary (Signed)
Physician Discharge Summary  ALIREZA POLLACK MWU:[id number] DOB: 02/12/1932 DOA: 12/09/2017  PCP: Tonia Ghent, MD  Admit date: 12/09/2017 Discharge date: 12/11/2017  Time spent: 22 minutes  Recommendations for Outpatient Follow-up:  1. Needs bmet and cbc 1 week 2. Need sOP Neuro follow-up 6 wk 3. FOllow with ENT as per their preference 4. NO driving given R sided Hemianopsia-he is unlikely to comply 5. Stared Elliquis this admit and stopped asa  Discharge Diagnoses:  Principal Problem:   Stroke [facility]) Active Problems:   Malignant neoplasm of larynx ([place])   Diabetes type 2, controlled ([place])   Essential hypertension   ATRIAL FIBRILLATION   Urinary retention   Loss of peripheral visual field   Hemianopia, homonymous, right   Discharge Condition: improved  Diet recommendation: hh low salt  Filed Weights   12/09/17 1256  Weight: 90.7 kg (200 lb)    History of present illness:  70 m prior laryngeal ca, htn, afib not on AC presbycusis, MIni-cog 18/20, chr Foot wounds R 1st Toe  admit from ED after seeing PCP who referred him with vision loss R eye    Hospital Course:  CVA-PT/OT rec OP neuro rehab-Placed on NOAC as per Neuro-d/c ASA-no driving-work-up complete :LAryngeal ca s/p surgery-ostomy looks good-phonates with device CHr Foot wounds-follow as OP Hld-cont statin simvastatin 40 daily Impaied glucose, A1c 5.7-monitor trends as OP    Procedures:  multiple   Consultations:  Neuro  Discharge Exam: Vitals:   12/11/17 0454 12/11/17 0908  BP: 102/65 117/65  Pulse: 90 75  Resp: 18 20  Temp: 98.9 F (37.2 C) 98.1 F (36.7 C)  SpO2: 95% 93%    General: alert pleasant still with R sided deficit Cardiovascular:  s1 s 2 irreg irreg no m Respiratory: clea rno added sound OStomy clean and clear  Discharge Instructions   Discharge Instructions    Ambulatory referral to Neurology   Complete by:  As directed    An appointment is requested in approximately:  6 weeeks Follow up with stroke clinic Cecille Rubin preferred, if not available, then consider Caesar Chestnut, Penumalli or Jaynee Eagles whoever is available) at [facility] in about 6-8 weeks. Thanks.   Diet - low sodium heart healthy   Complete by:  As directed    Discharge instructions   Complete by:  As directed    Take elliquis daily-avoid NSAIDS with ths med [like otc good powders etc] as that might increase your risk of bleeding NO driving given visual deficits Get labs ~ 1 week at pcp office Continue all other meds without changes   Increase activity slowly   Complete by:  As directed      Allergies as of 12/11/2017   No Known Allergies     Medication List    STOP taking these medications   aspirin 325 MG tablet     TAKE these medications   apixaban 5 MG Tabs tablet Commonly known as:  ELIQUIS Take 1 tablet (5 mg total) by mouth 2 (two) times daily.   EXCEDRIN PO Take 2 tablets by mouth as needed (body aches or headaches).   hydrochlorothiazide 12.5 MG tablet Commonly known as:  HYDRODIURIL Take 1 tablet (12.5 mg total) by mouth daily.   metFORMIN 500 MG tablet Commonly known as:  GLUCOPHAGE Take 1 tablet (500 mg total) by mouth 2 (two) times daily with a meal.   pioglitazone 45 MG tablet Commonly known as:  ACTOS Take 1 tablet (45 mg total) by mouth daily.  ramipril 5 MG capsule Commonly known as:  ALTACE Take 1 capsule (5 mg total) by mouth daily.   simvastatin 40 MG tablet Commonly known as:  ZOCOR Take 1 tablet (40 mg total) by mouth at bedtime.   tamsulosin 0.4 MG Caps capsule Commonly known as:  FLOMAX Take 1 capsule (0.4 mg total) by mouth daily. What changed:  Another medication with the same name was removed. Continue taking this medication, and follow the directions you see here.      No Known Allergies Follow-up Information    Dennie Bible, NP. Schedule an appointment as soon as possible for a visit in 6 week(s).   Specialty:  Family  Medicine Contact information: [address] [place] [address] [phone]            The results of significant diagnostics from this hospitalization (including imaging, microbiology, ancillary and laboratory) are listed below for reference.    Significant Diagnostic Studies: Dg Chest 2 View  Result Date: 12/09/2017 CLINICAL DATA:  Stroke with history of hypertension, diabetes, and atrial fibrillation. Former smoker. EXAM: CHEST  2 VIEW COMPARISON:  12/25/2016 chest radiograph FINDINGS: Moderate cardiomegaly stable given differences in projection and technique. Aortic atherosclerosis with calcification. Clear lungs. No pleural effusion or pneumothorax. No acute osseous abnormality is evident. IMPRESSION: Cardiomegaly.  Aortic atherosclerosis.  Clear lungs. Electronically Signed   By: Kristine Garbe M.D.   On: 12/09/2017 20:26   Ct Head Wo Contrast  Result Date: 12/09/2017 CLINICAL DATA:  84 y/o  M; vision changes. EXAM: CT HEAD WITHOUT CONTRAST TECHNIQUE: Contiguous axial images were obtained from the base of the skull through the vertex without intravenous contrast. COMPARISON:  None. FINDINGS: Brain: Subacute infarct within the left PCA distribution involving occipital lobe and posteromedial temporal lobe. Mild local mass effect. No hemorrhage. Small chronic lacunar infarction within the left lentiform nucleus extending into periventricular white matter frontal lobe. Moderate chronic microvascular ischemic changes of the brain and parenchymal volume loss. No hydrocephalus, effacement of basilar cisterns, or extra-axial collection. Vascular: Calcific atherosclerosis of carotid and vertebral arteries. No hyperdense vessel identified. Skull: Normal. Negative for fracture or focal lesion. Sinuses/Orbits: No acute finding. Other: Bilateral intra-ocular lens replacement. IMPRESSION: 1. Subacute infarct in left PCA distribution involving occipital lobe and posteromedial  temporal lobe. No hemorrhage. 2. Moderate chronic microvascular ischemic changes and parenchymal volume loss of the brain. Small chronic lacunar infarct in left basal ganglia. Electronically Signed   By: Kristine Garbe M.D.   On: 12/09/2017 14:11   Mr Brain Wo Contrast  Result Date: 12/09/2017 CLINICAL DATA:  84 y/o  M; stroke for follow-up. EXAM: MRI HEAD WITHOUT CONTRAST MRA HEAD WITHOUT CONTRAST TECHNIQUE: Multiplanar, multiecho pulse sequences of the brain and surrounding structures were obtained without intravenous contrast. Angiographic images of the head were obtained using MRA technique without contrast. COMPARISON:  12/09/2017 CT of the head. FINDINGS: MRI HEAD FINDINGS Brain: Mildly reduced diffusion compatible with late acute/early subacute infarct involving left occipital and posteromedial temporal lobe with additional punctate areas of infarction in the left posterior thalamus and left crus of the fornix. Mild edema and local mass effect associated with the area of infarction. Curvilinear susceptibility hypointensity is present throughout the left occipital lobe without CT correlate compatible with petechial hemorrhage. Chronic lacunar infarct within left putamen extending into corona radiata and caudate body. Moderate chronic microvascular ischemic changes and parenchymal volume loss of the brain. No extra-axial collection, effacement of basilar cisterns, or herniation. Vascular: As  below. Skull and upper cervical spine: Normal marrow signal. Sinuses/Orbits: Negative. Other: None. MRA HEAD FINDINGS Internal carotid arteries:  Patent. Anterior cerebral arteries:  Patent. Middle cerebral arteries: Patent. Anterior communicating artery: Patent. Posterior communicating arteries:  Patent. Posterior cerebral arteries:  Patent. Basilar artery:  Patent. Vertebral arteries:  Patent. No evidence of high-grade stenosis, large vessel occlusion, or aneurysm. IMPRESSION: MRI head: 1. Late acute/early  subacute infarction involving left occipital lobe and posteromedial temporal lobe. Additional punctate areas of infarction in left posterior thalamus and left crus of fornix. Petechial hemorrhage in left occipital lobe. No significant mass effect. 2. Moderate chronic microvascular ischemic changes and moderate parenchymal volume loss of the brain. 3. Small chronic lacunar infarct in left basal ganglia. MRI head: Patent anterior and posterior intracranial circulation. No large vessel occlusion, aneurysm, or significant stenosis. These results will be called to the ordering clinician or representative by the Radiologist Assistant, and communication documented in the PACS or zVision Dashboard. Electronically Signed   By: Kristine Garbe M.D.   On: 12/09/2017 21:31   Mr Jodene Nam Head Wo Contrast  Result Date: 12/09/2017 CLINICAL DATA:  84 y/o  M; stroke for follow-up. EXAM: MRI HEAD WITHOUT CONTRAST MRA HEAD WITHOUT CONTRAST TECHNIQUE: Multiplanar, multiecho pulse sequences of the brain and surrounding structures were obtained without intravenous contrast. Angiographic images of the head were obtained using MRA technique without contrast. COMPARISON:  12/09/2017 CT of the head. FINDINGS: MRI HEAD FINDINGS Brain: Mildly reduced diffusion compatible with late acute/early subacute infarct involving left occipital and posteromedial temporal lobe with additional punctate areas of infarction in the left posterior thalamus and left crus of the fornix. Mild edema and local mass effect associated with the area of infarction. Curvilinear susceptibility hypointensity is present throughout the left occipital lobe without CT correlate compatible with petechial hemorrhage. Chronic lacunar infarct within left putamen extending into corona radiata and caudate body. Moderate chronic microvascular ischemic changes and parenchymal volume loss of the brain. No extra-axial collection, effacement of basilar cisterns, or herniation.  Vascular: As below. Skull and upper cervical spine: Normal marrow signal. Sinuses/Orbits: Negative. Other: None. MRA HEAD FINDINGS Internal carotid arteries:  Patent. Anterior cerebral arteries:  Patent. Middle cerebral arteries: Patent. Anterior communicating artery: Patent. Posterior communicating arteries:  Patent. Posterior cerebral arteries:  Patent. Basilar artery:  Patent. Vertebral arteries:  Patent. No evidence of high-grade stenosis, large vessel occlusion, or aneurysm. IMPRESSION: MRI head: 1. Late acute/early subacute infarction involving left occipital lobe and posteromedial temporal lobe. Additional punctate areas of infarction in left posterior thalamus and left crus of fornix. Petechial hemorrhage in left occipital lobe. No significant mass effect. 2. Moderate chronic microvascular ischemic changes and moderate parenchymal volume loss of the brain. 3. Small chronic lacunar infarct in left basal ganglia. MRI head: Patent anterior and posterior intracranial circulation. No large vessel occlusion, aneurysm, or significant stenosis. These results will be called to the ordering clinician or representative by the Radiologist Assistant, and communication documented in the PACS or zVision Dashboard. Electronically Signed   By: Kristine Garbe M.D.   On: 12/09/2017 21:31    Microbiology: No results found for this or any previous visit (from the past 240 hour(s)).   Labs: Basic Metabolic Panel: Recent Labs  Lab 12/09/17 1336 12/09/17 1353  NA 138 139  K 4.4 4.3  CL 103 101  CO2 24  --   GLUCOSE 122* 119*  BUN 18 20  CREATININE 1.16 1.10  CALCIUM 9.7  --    Liver Function  Tests: Recent Labs  Lab 12/09/17 1336  AST 34  ALT 15*  ALKPHOS 92  BILITOT 1.3*  PROT 7.5  ALBUMIN 3.6   No results for input(s): LIPASE, AMYLASE in the last 168 hours. No results for input(s): AMMONIA in the last 168 hours. CBC: Recent Labs  Lab 12/09/17 1336 12/09/17 1353  WBC 7.9  --    NEUTROABS 6.1  --   HGB 14.3 15.0  HCT 42.6 44.0  MCV 106.8*  --   PLT 200  --    Cardiac Enzymes: No results for input(s): CKTOTAL, CKMB, CKMBINDEX, TROPONINI in the last 168 hours. BNP: BNP (last 3 results) No results for input(s): BNP in the last 8760 hours.  ProBNP (last 3 results) No results for input(s): PROBNP in the last 8760 hours.  CBG: Recent Labs  Lab 12/10/17 0800 12/10/17 1148 12/10/17 1701 12/10/17 2112 12/11/17 0620  GLUCAP 95 93 123* 167* 146*       Signed:  Nita Sells MD   [facility] 12/11/2017, 9:59 AM

## 2017-12-11 NOTE — Progress Notes (Signed)
Occupational Therapy Treatment Patient Details Name: David [name] MRN: [id number] DOB: 1932-10-06 Today's Date: 12/11/2017    History of present illness Pt is an 84 y.o.malewith PMH significant for a-fib (not anticoagulated), HTN, HLD, DM, urinary retention, malignancy oflarynx, who presents to ED 12/09/17 with c/o loss of peripheral vision. MRI showing late acute/early subacute infarction involving left occipital lobe and posteromedial temporal lobe; additional punctate areas of infarction in left posterior thalamus and left crus of fornix; petechial hemorrhage in left occipital lobe.   OT comments  Pt continues to demonstrate decreased balance and right visual field cut with inattention. During functional mobiltiy and ADLs, pt continues to require Mod-Max VCs for attending to R side or locating objects on right side. Providing pt and wife on education for safe shower transfer and use of 3N1 as shower seat. Provided education and handout on safety and compensatory techniques for visual field cuts; pt and wife verbalizing understanding. Answering pt and family questions in preparation for dc later today. Will continue to follow as admitted.   Follow Up Recommendations  Outpatient OT;Supervision/Assistance - 24 hour(Neuro Outpatient OT)    Equipment Recommendations  3 in 1 bedside commode(As shower seat)    Recommendations for Other Services PT consult    Precautions / Restrictions Precautions Precautions: Fall;Other (comment) Precaution Comments: R visual field cut Restrictions Weight Bearing Restrictions: No       Mobility Bed Mobility               General bed mobility comments: OOB with PT upon arrival  Transfers Overall transfer level: Needs assistance Equipment used: None Transfers: Sit to/from Stand Sit to Stand: Supervision         General transfer comment: Supervision for safety    Balance Overall balance assessment: Needs assistance Sitting-balance  support: No upper extremity supported;Feet supported Sitting balance-Leahy Scale: Fair     Standing balance support: No upper extremity supported;During functional activity Standing balance-Leahy Scale: Fair Standing balance comment: Can ambulate with no UE support and supervision for safety                           ADL either performed or assessed with clinical judgement   ADL Overall ADL's : Needs assistance/impaired                                 Tub/ Shower Transfer: Walk-in shower;3 in 1 Tub/Shower Transfer Details (indicate cue type and reason): Educated wife and pt on useof 3N1 forshower chair and importance of having for safety. Pt stating "I dont need that" and "somethign else for the trash man." Wife agreeing with need for 3N1 and grateful for information Functional mobility during ADLs: Min guard General ADL Comments: Pt continues to present with decreased balance and R visual field cut with inattention. Focused session on hoem safety and vision handout. Educating pt and wife and compensatory techiques for ADLs (i.e. scannig, anchors, walking on R side of pt, etc.) Mod-Max VCs to locate room on right side after funcitonal mobility. Recommended to pt and family to follow up with eye MD, no driving, and attend neuro OP OT to increase safety during ADLs.      Vision   Vision Assessment?: Yes;Vision impaired- to be further tested in functional context Eye Alignment: Within Functional Limits Tracking/Visual Pursuits: Decreased smoothness of horizontal tracking;Decreased smoothness of vertical tracking;Unable to hold eye position out  of midline;Requires cues, head turns, or add eye shifts to track(Pt with poor tracking and required Max cues) Convergence: Impaired (comment) Visual Fields: Right homonymous hemianopsia;Right visual field deficit;Impaired-to be further tested in functional context Additional Comments: R field cut and inattention noted. Continues  to require cues for attending to R side and scan R.   Perception     Praxis      Cognition Arousal/Alertness: Awake/alert Behavior During Therapy: WFL for tasks assessed/performed Overall Cognitive Status: Impaired/Different from baseline Area of Impairment: Following commands;Safety/judgement;Awareness;Problem solving                       Following Commands: Follows multi-step commands with increased time Safety/Judgement: Decreased awareness of safety;Decreased awareness of deficits Awareness: Emergent Problem Solving: Slow processing;Requires verbal cues;Requires tactile cues General Comments: Pt demonstrating increased awarness of deficits and performing compensatory tehcniques with Mod-Max VCs.         Exercises     Shoulder Instructions       General Comments Wife present throughout session and very grateful for education    Pertinent Vitals/ Pain       Pain Assessment: No/denies pain  Home Living                                          Prior Functioning/Environment              Frequency  Min 3X/week        Progress Toward Goals  OT Goals(current goals can now be found in the care plan section)  Progress towards OT goals: Progressing toward goals  Acute Rehab OT Goals Patient Stated Goal: Go home as soon as possible OT Goal Formulation: With patient Time For Goal Achievement: 12/24/17 Potential to Achieve Goals: Good ADL Goals Pt Will Perform Grooming: with set-up;with supervision;standing(2-3 for locating items on R side) Pt Will Perform Lower Body Dressing: with modified independence;sit to/from stand Pt Will Transfer to Toilet: with modified independence;ambulating;regular height toilet Pt Will Perform Tub/Shower Transfer: Shower transfer;ambulating;3 in 1;with set-up;with supervision Additional ADL Goal #1: Pt will verbalize three vision compensatory techniques with 2-3 VCs.  Plan Discharge plan remains  appropriate    Co-evaluation                 AM-PAC PT "6 Clicks" Daily Activity     Outcome Measure   Help from another person eating meals?: A Little Help from another person taking care of personal grooming?: A Little Help from another person toileting, which includes using toliet, bedpan, or urinal?: A Little Help from another person bathing (including washing, rinsing, drying)?: A Little Help from another person to put on and taking off regular upper body clothing?: A Little Help from another person to put on and taking off regular lower body clothing?: A Little 6 Click Score: 18    End of Session Equipment Utilized During Treatment: Gait belt  OT Visit Diagnosis: Unsteadiness on feet (R26.81);Other abnormalities of gait and mobility (R26.89);Muscle weakness (generalized) (M62.81);Low vision, both eyes (H54.2);Other symptoms and signs involving cognitive function   Activity Tolerance Patient tolerated treatment well   Patient Left with call bell/phone within reach;with family/visitor present(EOB)   Nurse Communication Mobility status;Precautions        Time: 3244-0102 OT Time Calculation (min): 14 min  Charges: OT General Charges $OT Visit: 1 Visit OT Treatments $Self Care/Home Management :  8-22 mins  [place], OTR/L Acute Rehab Pager: [phone] Office: [place] 12/11/2017, 10:31 AM

## 2017-12-17 ENCOUNTER — Ambulatory Visit: Payer: Medicare Other | Admitting: Family Medicine

## 2017-12-17 ENCOUNTER — Encounter: Payer: Self-pay | Admitting: Family Medicine

## 2017-12-17 VITALS — BP 108/62 | HR 62 | Temp 97.8°F | Wt 206.0 lb

## 2017-12-17 DIAGNOSIS — I480 Paroxysmal atrial fibrillation: Secondary | ICD-10-CM

## 2017-12-17 DIAGNOSIS — I63432 Cerebral infarction due to embolism of left posterior cerebral artery: Secondary | ICD-10-CM | POA: Diagnosis not present

## 2017-12-17 LAB — BASIC METABOLIC PANEL
BUN: 22 mg/dL (ref 6–23)
CALCIUM: 10 mg/dL (ref 8.4–10.5)
CHLORIDE: 100 meq/L (ref 96–112)
CO2: 32 meq/L (ref 19–32)
Creatinine, Ser: 1 mg/dL (ref 0.40–1.50)
GFR: 75.7 mL/min (ref >60.00)
GLUCOSE: 113 mg/dL — AB (ref 70–99)
Potassium: 4.7 mEq/L (ref 3.5–5.1)
SODIUM: 138 meq/L (ref 135–145)

## 2017-12-17 LAB — CBC WITH DIFFERENTIAL/PLATELET
Basophils Absolute: 0.1 10*3/uL (ref 0.0–0.1)
Basophils Relative: 0.8 % (ref 0.0–3.0)
Eosinophils Absolute: 0 10*3/uL (ref 0.0–0.7)
Eosinophils Relative: 0.7 % (ref 0.0–5.0)
HCT: 43.2 % (ref 39.0–52.0)
HEMOGLOBIN: 14.7 g/dL (ref 13.0–17.0)
Lymphocytes Relative: 20.1 % (ref 12.0–46.0)
Lymphs Abs: 1.4 10*3/uL (ref 0.7–4.0)
MCHC: 34 g/dL (ref 30.0–36.0)
MCV: 107.2 fl — ABNORMAL HIGH (ref 78.0–100.0)
MONOS PCT: 8.5 % (ref 3.0–12.0)
Monocytes Absolute: 0.6 10*3/uL (ref 0.1–1.0)
Neutro Abs: 5 10*3/uL (ref 1.4–7.7)
Neutrophils Relative %: 69.9 % (ref 43.0–77.0)
Platelets: 213 10*3/uL (ref 150.0–400.0)
RBC: 4.03 Mil/uL — AB (ref 4.22–5.81)
RDW: 12.4 % (ref 11.5–15.5)
WBC: 7.2 10*3/uL (ref 4.0–10.5)

## 2017-12-17 MED ORDER — RAMIPRIL 5 MG PO CAPS: 5.0000 mg | ORAL_CAPSULE | Freq: Every day | ORAL | 3 refills | Status: DC

## 2017-12-17 MED ORDER — APIXABAN 5 MG PO TABS: 5.0000 mg | ORAL_TABLET | Freq: Two times a day (BID) | ORAL | 3 refills | Status: DC

## 2017-12-17 MED ORDER — HYDROCHLOROTHIAZIDE 12.5 MG PO TABS: 12.5000 mg | ORAL_TABLET | Freq: Every day | ORAL | 3 refills | Status: DC

## 2017-12-17 MED ORDER — PIOGLITAZONE HCL 45 MG PO TABS: 45.0000 mg | ORAL_TABLET | Freq: Every day | ORAL | 3 refills | Status: DC

## 2017-12-17 MED ORDER — METFORMIN HCL 500 MG PO TABS: 500.0000 mg | ORAL_TABLET | Freq: Two times a day (BID) | ORAL | 3 refills | Status: DC

## 2017-12-17 MED ORDER — TAMSULOSIN HCL 0.4 MG PO CAPS: 0.4000 mg | ORAL_CAPSULE | Freq: Every day | ORAL | 3 refills | Status: DC

## 2017-12-17 MED ORDER — SIMVASTATIN 40 MG PO TABS: 40.0000 mg | ORAL_TABLET | Freq: Every day | ORAL | 3 refills | Status: DC

## 2017-12-17 NOTE — Patient Instructions (Addendum)
Rosaria Ferries will call about your referral for neurology.  If you need help getting set up with ENT then let me know.  Go to the lab on the way out.  We'll contact you with your lab report. Take care.  Glad to see you.  Try taking tylenol up to 3 times a day.  Stop aspirin/excedrin.   As the front to reschedule for visits with me and Pinson in late April.

## 2017-12-17 NOTE — Progress Notes (Signed)
  Recommendations for Outpatient Follow-up:  1. Needs bmet and cbc 1 week 2. Need OP Neuro follow-up 6 wk 3. FOllow with ENT as per their preference 4. NO driving given R sided Hemianopsia-he is unlikely to comply 5. Stared Elliquis this admit and stopped asa  Discharge Diagnoses:  Principal Problem:   Stroke [facility]) Active Problems:   Malignant neoplasm of larynx ([place])   Diabetes type 2, controlled ([place])   Essential hypertension   ATRIAL FIBRILLATION   Urinary retention   Loss of peripheral visual field   Hemianopia, homonymous, right   Discharge Condition: improved  Diet recommendation: hh low salt     Filed Weights   12/09/17 1256  Weight: 90.7 kg (200 lb)    History of present illness:  85 m prior laryngeal ca, htn, afib not on AC presbycusis, MIni-cog 18/20, chr Foot wounds R 1st Toe admit from ED after seeing PCP who referred him with vision loss R eye    Hospital Course:  CVA-PT/OT rec OP neuro rehab-Placed on NOAC as per Neuro-d/c ASA-no driving-work-up complete :LAryngeal ca s/p surgery-ostomy looks good-phonates with device CHr Foot wounds-follow as OP Hld-cont statin simvastatin 40 daily Impaied glucose, A1c 5.7-monitor trends as OP   ======================== Inpatient course discussed with patient.  He had lateral vision loss in the right eye.  Went to hospital, diagnosed with stroke.  MRI images reviewed with patient today.  He accepted anticoagulation during this hospitalization, was discharged and presents for follow-up.  No new focal neurologic changes in the interval.  He still has some right eye lateral/peripheral vision deficits.  No new weakness.  Phonation is still at baseline with assistive device.  No gait changes.  Discussed with patient about physical therapy, declined.  Discussed with patient about having a call button in case he fell, he will consider this.  Eliquis is really expensive for the patient.  Discussed.  Stroke  pathophysiology and atrial fibrillation discussed with patient.  PMH and SH reviewed  ROS: Per HPI unless specifically indicated in ROS section   Meds, vitals, and allergies reviewed.   GEN: nad, alert and oriented HEENT: mucous membranes moist NECK: supple w/o LA, tracheostomy at baseline, using phonation device. CV: IRR PULM: ctab, no inc wob ABD: soft, +bs EXT: no edema SKIN: no acute rash but bruising at baseline.  He declined foot exam

## 2017-12-18 NOTE — Assessment & Plan Note (Addendum)
Recently with stroke likely related to A. fib.  Now on anticoagulation.  Declined physical therapy.  He will consider getting a fall button.  Advised him not to drive.  He was already aware of this caution.  I will check on medication assistance with Eliquis.  No change in medications at this time.  Refer to neurology for routine follow-up.  Pathophysiology of stroke along with recent imaging discussed with patient.  Images reviewed at the office visit.  See notes on routine labs, done at office visit. >25 minutes spent in face to face time with patient, >50% spent in counselling or coordination of care.   See AVS.

## 2017-12-19 DIAGNOSIS — Z961 Presence of intraocular lens: Secondary | ICD-10-CM | POA: Diagnosis not present

## 2017-12-19 DIAGNOSIS — H53461 Homonymous bilateral field defects, right side: Secondary | ICD-10-CM | POA: Diagnosis not present

## 2017-12-19 DIAGNOSIS — H353132 Nonexudative age-related macular degeneration, bilateral, intermediate dry stage: Secondary | ICD-10-CM | POA: Diagnosis not present

## 2017-12-23 ENCOUNTER — Telehealth: Payer: Self-pay | Admitting: Family Medicine

## 2017-12-23 NOTE — Telephone Encounter (Signed)
Spoke to pt. He will pick up medication assistance program form from front office at his convenience for Eliquis.

## 2018-01-06 ENCOUNTER — Ambulatory Visit: Payer: Medicare Other

## 2018-01-09 ENCOUNTER — Encounter: Payer: Medicare Other | Admitting: Family Medicine

## 2018-02-12 ENCOUNTER — Ambulatory Visit: Payer: Medicare Other | Admitting: Neurology

## 2018-02-12 ENCOUNTER — Encounter: Payer: Self-pay | Admitting: Neurology

## 2018-02-12 ENCOUNTER — Telehealth: Payer: Self-pay | Admitting: Neurology

## 2018-02-12 VITALS — BP 126/73 | HR 84 | Wt 219.4 lb

## 2018-02-12 DIAGNOSIS — C329 Malignant neoplasm of larynx, unspecified: Secondary | ICD-10-CM

## 2018-02-12 DIAGNOSIS — I63432 Cerebral infarction due to embolism of left posterior cerebral artery: Secondary | ICD-10-CM

## 2018-02-12 DIAGNOSIS — E119 Type 2 diabetes mellitus without complications: Secondary | ICD-10-CM | POA: Diagnosis not present

## 2018-02-12 DIAGNOSIS — I480 Paroxysmal atrial fibrillation: Secondary | ICD-10-CM | POA: Diagnosis not present

## 2018-02-12 DIAGNOSIS — H53461 Homonymous bilateral field defects, right side: Secondary | ICD-10-CM | POA: Diagnosis not present

## 2018-02-12 DIAGNOSIS — I10 Essential (primary) hypertension: Secondary | ICD-10-CM | POA: Diagnosis not present

## 2018-02-12 NOTE — Progress Notes (Signed)
STROKE NEUROLOGY FOLLOW UP NOTE  NAME: David [name] DOB: 1932-09-29  REASON FOR VISIT: stroke follow up HISTORY FROM: pt and chart  Today we had the pleasure of seeing THERON CUMBIE in follow-up at our Neurology Clinic. Pt was accompanied by wife he is [place].   History Summary 84 year old male with history of A. fib not on AC but on aspirin, neck cancer status post surgery, DM, HTN, HLD admitted for right hemianopia on 12/09/17. He also had a TIA episode with aphasia for 20 minutes 6 months PTA.  Exam showed right hemianopia.  MRI showed left PCA infarct with petechial hemorrhage, and old small left CR infarct.  MRA negative.  Carotid Doppler negative and EF 50-55%.  LDL 52 and A1c 5.7. Put on eliquis on discharge. Patient also educated on no driving due to hemianopia and close follow up with ophthalmology.  Interval History During the interval time, the patient has been doing largely the same.  He still has right dense hemianopia.  He follow with ophthalmology and was told that will be permanent.  He felt sad about the news and currently not driving outside of [place].  BP stable 126/73.  On Eliquis and Zocor without side effect.  REVIEW OF SYSTEMS: Full 14 system review of systems performed and notable only for those listed below and in HPI above, all others are negative:  Constitutional:   Cardiovascular:  Ear/Nose/Throat:   Skin:  Eyes:   Respiratory:   Gastroitestinal:   Genitourinary:  Hematology/Lymphatic:   Endocrine: Cold intolerance Musculoskeletal:   Allergy/Immunology:   Neurological: Speech difficulty Psychiatric:  Sleep:   The following represents the patient's updated allergies and side effects list: No Known Allergies  The neurologically relevant items on the patient's problem list were reviewed on today's visit.  Neurologic Examination  A problem focused neurological exam (12 or more points of the single system neurologic examination, vital signs counts as 1  point, cranial nerves count for 8 points) was performed.  Blood pressure 126/73, pulse 84, weight 219 lb 6.4 oz (99.5 kg).  General - Well nourished, well developed, in no apparent distress.  Ophthalmologic - Fundi not visualized due to noncooperation.  Cardiovascular - irregularly irregular heart rate and rhythm.  Mental Status -  Level of arousal and orientation to time, place, and person were intact. Language including expression, naming, repetition, comprehension was assessed and found intact. Fund of Knowledge was assessed and was intact.  Cranial Nerves II - XII - II - right dense hemianopia. III, IV, VI - Extraocular movements intact. V - Facial sensation intact bilaterally. VII - Facial movement intact bilaterally. VIII - Hearing & vestibular intact bilaterally. X - neck old wound s/p surgery, speaking with device, low voice. XI - Chin turning & shoulder shrug intact bilaterally. XII - Tongue protrusion intact.  Motor Strength - The patient's strength was normal in all extremities and pronator drift was absent.  Bulk was normal and fasciculations were absent.   Motor Tone - Muscle tone was assessed at the neck and appendages and was normal.  Reflexes - The patient's reflexes were 1+ in all extremities and he had no pathological reflexes.  Sensory - Light touch, temperature/pinprick were assessed and were normal.    Coordination - The patient had normal movements in the hands with no ataxia or dysmetria.  Tremor was absent.  Gait and Station - The patient's transfers, posture, gait, station, and turns were observed as normal.   Functional score  mRS =  3   0 - No symptoms.   1 - No significant disability. Able to carry out all usual activities, despite some symptoms.   2 - Slight disability. Able to look after own affairs without assistance, but unable to carry out all previous activities.   3 - Moderate disability. Requires some help, but able to walk  unassisted.   4 - Moderately severe disability. Unable to attend to own bodily needs without assistance, and unable to walk unassisted.   5 - Severe disability. Requires constant nursing care and attention, bedridden, incontinent.   6 - Dead.   NIH Stroke Scale   Level Of Consciousness 0=Alert; keenly responsive 1=Not alert, but arousable by minor stimulation 2=Not alert, requires repeated stimulation 3=Responds only with reflex movements 0  LOC Questions to Month and Age 4=Answers both questions correctly 1=Answers one question correctly 2=Answers neither question correctly 0  LOC Commands      -Open/Close eyes     -Open/close grip 0=Performs both tasks correctly 1=Performs one task correctly 2=Performs neighter task correctly 0  Best Gaze 0=Normal 1=Partial gaze palsy 2=Forced deviation, or total gaze paresis 0  Visual 0=No visual loss 1=Partial hemianopia 2=Complete hemianopia 3=Bilateral hemianopia (blind including cortical blindness) 2  Facial Palsy 0=Normal symmetrical movement 1=Minor paralysis (asymmetry) 2=Partial paralysis (lower face) 3=Complete paralysis (upper and lower face) 0  Motor  0=No drift, limb holds posture for full 10 seconds 1=Drift, limb holds posture, no drift to bed 2=Some antigravity effort, cannot maintain posture, drifts to bed 3=No effort against gravity, limb falls 4=No movement Right Arm 0     Leg 0    Left Arm 0     Leg 0  Limb Ataxia 0=Absent 1=Present in one limb 2=Present in two limbs 0  Sensory 0=Normal 1=Mild to moderate sensory loss 2=Severe to total sensory loss 0  Best Language 0=No aphasia, normal 1=Mild to moderate aphasia 2=Mute, global aphasia 3=Mute, global aphasia 0  Dysarthria 0=Normal 1=Mild to moderate 2=Severe, unintelligible or mute/anarthric 2  Extinction/Neglect 0=No abnormality 1=Extinction to bilateral simultaneous stimulation 2=Profound neglect 0  Total   4     Data reviewed: I personally  reviewed the images and agree with the radiology interpretations.  Ct Head Wo Contrast Result Date: 12/09/2017 IMPRESSION: 1. Subacute infarct in left PCA distribution involving occipital lobe and posteromedial temporal lobe. No hemorrhage. 2. Moderate chronic microvascular ischemic changes and parenchymal volume loss of the brain. Small chronic lacunar infarct in left basal ganglia. Electronically Signed   By: Kristine Garbe M.D.   On: 12/09/2017 14:11   MRI/ Mra Head Wo Contrast Result Date: 12/09/2017 IMPRESSION: MRI head: 1. Late acute/early subacute infarction involving left occipital lobe and posteromedial temporal lobe. Additional punctate areas of infarction in left posterior thalamus and left crus of fornix. Petechial hemorrhage in left occipital lobe. No significant mass effect. 2. Moderate chronic microvascular ischemic changes and moderate parenchymal volume loss of the brain. 3. Small chronic lacunar infarct in left basal ganglia. MRI head: Patent anterior and posterior intracranial circulation. No large vessel occlusion, aneurysm, or significant stenosis.   Echocardiogram:                                               Impressions: - The patient was in atrial fibrillation. Normal LV size with EF 50-55% (low normal to mildly reduced systolic function). Moderate LAE,  severe RAE. The RV was mildly dilated with mildly decreased systolic function. Moderate pulmonary hypertension. Dilated IVC suggestive of elevated RV filling pressure.  B/L Carotid U/S:                                                1% to 39% ICA stenosis. Vertebral artery flow is antegrade  Component     Latest Ref Rng & Units 12/09/2017  Cholesterol     0 - 200 mg/dL 116  Triglycerides     <150 mg/dL 56  HDL Cholesterol     >40 mg/dL 53  Total CHOL/HDL Ratio     RATIO 2.2  VLDL     0 - 40 mg/dL 11  LDL (calc)     0 - 99 mg/dL 52  Hemoglobin A1C     4.8 - 5.6 % 5.7 (H)  Mean Plasma  Glucose     mg/dL 116.89  TSH     0.350 - 4.500 uIU/mL 0.622    Assessment: As you may recall, he is a 84 y.o. Caucasian male with PMH of A. fib not on AC but on aspirin, neck cancer status post surgery on speaking assistant device, DM, HTN, HLD admitted for right hemianopia on 12/09/17. He also had a TIA episode with aphasia for 20 minutes 6 months PTA.  Exam showed right hemianopia.  MRI showed left PCA infarct with petechial hemorrhage, and old small left CR infarct.  MRA negative.  Carotid Doppler negative and EF 50-55%.  LDL 52 and A1c 5.7. Put on eliquis on discharge. Patient also educated on no driving due to hemianopia and followed with ophthalmology but told that will be permanent.  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  On Eliquis and Zocor without side effect.  Plan:  - continue eliquis and zocor for stroke prevention - close follow up with eye doctor to monitor improvement - Follow up with your primary care physician for stroke risk factor modification. Recommend maintain blood pressure goal <130/80, diabetes with hemoglobin A1c goal below 7.0% and lipids with LDL cholesterol goal below 70 mg/dL.  - no driving on the road with traffic. May be OK to drive within farm without any traffic. But be cautious.  - check BP and glucose at home and record - healthy diet and regular exercise  - follow up in 3 months with Jessicca  I spent more than 25 minutes of face to face time with the patient. Greater than 50% of time was spent in counseling and coordination of care. We discussed no driving outside farm, follow up with eye doctor.   No orders of the defined types were placed in this encounter.   No orders of the defined types were placed in this encounter.   Patient Instructions  - continue eliquis and zocor for stroke prevention - close follow up with eye doctor to monitor improvement - Follow up with your primary care physician for stroke risk factor  modification. Recommend maintain blood pressure goal <130/80, diabetes with hemoglobin A1c goal below 7.0% and lipids with LDL cholesterol goal below 70 mg/dL.  - no driving on the road with traffic. May be OK to drive within farm without any traffic. But be cautious.  - check BP and glucose at home and record - healthy diet and regular exercise  -  follow up in 3 months with Zenaida Niece   Rosalin Hawking, MD PhD [facility] [address], [place], [place] [address] [phone]

## 2018-02-12 NOTE — Telephone Encounter (Signed)
Pt. States he will call us back to schedule 3 month follow-up w/ NP Janett Billow.

## 2018-02-12 NOTE — Patient Instructions (Signed)
-   continue eliquis and zocor for stroke prevention - close follow up with eye doctor to monitor improvement - Follow up with your primary care physician for stroke risk factor modification. Recommend maintain blood pressure goal <130/80, diabetes with hemoglobin A1c goal below 7.0% and lipids with LDL cholesterol goal below 70 mg/dL.  - no driving on the road with traffic. May be OK to drive within farm without any traffic. But be cautious.  - check BP and glucose at home and record - healthy diet and regular exercise  - follow up in 3 months with David [name]

## 2018-03-10 ENCOUNTER — Other Ambulatory Visit: Payer: Self-pay | Admitting: Family Medicine

## 2018-03-10 DIAGNOSIS — E119 Type 2 diabetes mellitus without complications: Secondary | ICD-10-CM

## 2018-03-12 ENCOUNTER — Ambulatory Visit: Payer: Medicare Other

## 2018-03-13 ENCOUNTER — Ambulatory Visit: Payer: Medicare Other

## 2018-03-13 ENCOUNTER — Other Ambulatory Visit (INDEPENDENT_AMBULATORY_CARE_PROVIDER_SITE_OTHER): Payer: Medicare Other

## 2018-03-13 DIAGNOSIS — E119 Type 2 diabetes mellitus without complications: Secondary | ICD-10-CM | POA: Diagnosis not present

## 2018-03-13 LAB — COMPREHENSIVE METABOLIC PANEL
ALT: 14 U/L (ref 0–53)
AST: 26 U/L (ref 0–37)
Albumin: 3.8 g/dL (ref 3.5–5.2)
Alkaline Phosphatase: 81 U/L (ref 39–117)
BILIRUBIN TOTAL: 1.2 mg/dL (ref 0.2–1.2)
BUN: 23 mg/dL (ref 6–23)
CO2: 29 meq/L (ref 19–32)
Calcium: 9.9 mg/dL (ref 8.4–10.5)
Chloride: 102 mEq/L (ref 96–112)
Creatinine, Ser: 1.07 mg/dL (ref 0.40–1.50)
GFR: 69.97 mL/min (ref >60.00)
GLUCOSE: 95 mg/dL (ref 70–99)
POTASSIUM: 4.9 meq/L (ref 3.5–5.1)
SODIUM: 137 meq/L (ref 135–145)
Total Protein: 7.4 g/dL (ref 6.0–8.3)

## 2018-03-13 LAB — HEMOGLOBIN A1C: HEMOGLOBIN A1C: 6 % (ref 4.6–6.5)

## 2018-03-18 ENCOUNTER — Encounter: Payer: Self-pay | Admitting: Family Medicine

## 2018-03-18 ENCOUNTER — Ambulatory Visit (INDEPENDENT_AMBULATORY_CARE_PROVIDER_SITE_OTHER): Payer: Medicare Other | Admitting: Family Medicine

## 2018-03-18 DIAGNOSIS — E119 Type 2 diabetes mellitus without complications: Secondary | ICD-10-CM

## 2018-03-18 DIAGNOSIS — I10 Essential (primary) hypertension: Secondary | ICD-10-CM | POA: Diagnosis not present

## 2018-03-18 DIAGNOSIS — Z7189 Other specified counseling: Secondary | ICD-10-CM

## 2018-03-18 DIAGNOSIS — E785 Hyperlipidemia, unspecified: Secondary | ICD-10-CM

## 2018-03-18 DIAGNOSIS — I63432 Cerebral infarction due to embolism of left posterior cerebral artery: Secondary | ICD-10-CM | POA: Diagnosis not present

## 2018-03-18 DIAGNOSIS — Z0000 Encounter for general adult medical examination without abnormal findings: Secondary | ICD-10-CM

## 2018-03-18 NOTE — Patient Instructions (Addendum)
The only lab you need to have done for your next diabetic visit is an A1c.  We can do this with a fingerstick test at the office visit.  You do not need a lab visit ahead of time for this.  It does not matter if you are fasting when the lab is done.   Recheck in about 3-4 month at a visit with [facility].  No lab visit ahead of time.   Don't change your meds for now.  If you have any troubles at all with low sugars, then let me know so we can adjust your meds.  Take care.  Glad to see you.

## 2018-03-18 NOTE — Progress Notes (Signed)
Diabetes:  Using medications without difficulties: yes Hypoglycemic episodes:no Hyperglycemic episodes:no Feet problems: still with some callus on the feet.   Blood Sugars averaging: ~100 recently eye exam within last year: yes, 11/2017.  He has f/u for next month with eye clinic.    A1c at goal, d/w pt.   Colon and prostate cancer screening not due given his age.   D/w pt.  He agrees.   Vaccines up to date.   Yearly CXR prev done as inpatient, defer for 2019, d/w pt.  He agrees.   Living will d/w pt.  Wife designated if patient were incapacitated.    Hypertension:    Using medication without problems or lightheadedness: yes Chest pain with exertion:no Edema:no Short of breath:no Labs d/w pt.    Elevated Cholesterol: Using medications without problems:yes Muscle aches: not from statin.  Diet compliance: yes Exercise: yes, as best as tolerated, farming- cautions d/w pt.    CVA hx.  Still with vision difficulty and he isn't driving.  D/w pt.  He has seen neurology.  No new numbness or weakness.  He didn't want to follow up with neurology, d/w pt.  No bleeding.    Fall cautions d/w pt.    Laryngectomy.  Still using phonator at baseline.  He has dysphagia to some solids but not liquids.  He was going to f/u with Dr. Lucia Gaskins about that.  D/w pt.  Stoma is holding up.    PMH and SH reviewed  Meds, vitals, and allergies reviewed.   ROS: Per HPI unless specifically indicated in ROS section   GEN: nad, alert and oriented HEENT: mucous membranes moist NECK: supple w/o LA CV: rrr. PULM: ctab, no inc wob ABD: soft, +bs EXT: no edema SKIN: no acute rash  Diabetic foot exam: Normal inspection No skin breakdown Callus noted on R 1st toe w/o breakdown Dec but intact DP pulses Normal sensation to light touch and monofilament Nails thickened, esp B 1st toe.

## 2018-03-19 DIAGNOSIS — Z7189 Other specified counseling: Secondary | ICD-10-CM | POA: Insufficient documentation

## 2018-03-19 NOTE — Assessment & Plan Note (Signed)
A1c at goal, d/w pt.  If any troubles at all with low sugars, then he'll let me know so we can adjust his meds.  Routine foot care and cautions discussed with patient. No change in meds at this point he agrees.   See AVS.

## 2018-03-19 NOTE — Assessment & Plan Note (Signed)
Reasonable control.  No change in meds.  Labs discussed with patient.  Routine cautions given.  He agrees.

## 2018-03-19 NOTE — Assessment & Plan Note (Signed)
Lipids previously at goal.  No change in meds.  Continue statin.  He agrees.

## 2018-03-19 NOTE — Assessment & Plan Note (Signed)
Living will d/w pt.  Wife designated if patient were incapacitated.   ?

## 2018-03-19 NOTE — Assessment & Plan Note (Signed)
Colon and prostate cancer screening not due given his age.   D/w pt.  He agrees.   Vaccines up to date.   Yearly CXR prev done as inpatient, defer for 2019, d/w pt.  He agrees.   Living will d/w pt.  Wife designated if patient were incapacitated.

## 2018-03-19 NOTE — Assessment & Plan Note (Signed)
He is no longer driving.  He is still trying to work on the farm.  Routine cautions given.  Continue current medications for secondary prevention, especially anticoagulation.  Discussed with patient.  He agrees.  He did not want to follow-up with neurology and I am okay with prescribing his medications here assuming he has no other new neurologic symptoms.  He agrees. >25 minutes spent in face to face time with patient, >50% spent in counselling or coordination of care, discussing blood pressure, diabetes, stroke, etc.

## 2018-06-18 ENCOUNTER — Other Ambulatory Visit: Payer: Self-pay | Admitting: Sports Medicine

## 2018-06-18 DIAGNOSIS — E559 Vitamin D deficiency, unspecified: Secondary | ICD-10-CM | POA: Diagnosis not present

## 2018-06-18 DIAGNOSIS — M5136 Other intervertebral disc degeneration, lumbar region: Secondary | ICD-10-CM

## 2018-06-18 DIAGNOSIS — M51369 Other intervertebral disc degeneration, lumbar region without mention of lumbar back pain or lower extremity pain: Secondary | ICD-10-CM

## 2018-06-18 DIAGNOSIS — M810 Age-related osteoporosis without current pathological fracture: Secondary | ICD-10-CM | POA: Diagnosis not present

## 2018-06-18 DIAGNOSIS — M545 Low back pain: Secondary | ICD-10-CM | POA: Diagnosis not present

## 2018-06-18 DIAGNOSIS — R5383 Other fatigue: Secondary | ICD-10-CM | POA: Diagnosis not present

## 2018-06-23 DIAGNOSIS — H52203 Unspecified astigmatism, bilateral: Secondary | ICD-10-CM | POA: Diagnosis not present

## 2018-06-23 DIAGNOSIS — H04123 Dry eye syndrome of bilateral lacrimal glands: Secondary | ICD-10-CM | POA: Diagnosis not present

## 2018-06-23 DIAGNOSIS — E119 Type 2 diabetes mellitus without complications: Secondary | ICD-10-CM | POA: Diagnosis not present

## 2018-06-23 DIAGNOSIS — H53461 Homonymous bilateral field defects, right side: Secondary | ICD-10-CM | POA: Diagnosis not present

## 2018-06-23 LAB — HM DIABETES EYE EXAM

## 2018-06-26 ENCOUNTER — Ambulatory Visit
Admission: RE | Admit: 2018-06-26 | Discharge: 2018-06-26 | Disposition: A | Discharge status: Home / Self Care | Payer: Medicare Other | Source: Ambulatory Visit | Attending: Sports Medicine | Admitting: Sports Medicine

## 2018-06-26 DIAGNOSIS — M5136 Other intervertebral disc degeneration, lumbar region: Secondary | ICD-10-CM

## 2018-06-26 DIAGNOSIS — M48061 Spinal stenosis, lumbar region without neurogenic claudication: Secondary | ICD-10-CM | POA: Diagnosis not present

## 2018-06-26 DIAGNOSIS — M51369 Other intervertebral disc degeneration, lumbar region without mention of lumbar back pain or lower extremity pain: Secondary | ICD-10-CM

## 2018-06-27 ENCOUNTER — Encounter: Payer: Self-pay | Admitting: Family Medicine

## 2018-07-01 DIAGNOSIS — M810 Age-related osteoporosis without current pathological fracture: Secondary | ICD-10-CM | POA: Diagnosis not present

## 2018-07-01 DIAGNOSIS — S32020A Wedge compression fracture of second lumbar vertebra, initial encounter for closed fracture: Secondary | ICD-10-CM | POA: Diagnosis not present

## 2018-07-08 ENCOUNTER — Telehealth: Payer: Self-pay

## 2018-07-08 NOTE — Telephone Encounter (Signed)
Copied from [place] [id number]. Topic: Inquiry >> Jul 08, 2018 12:58 PM Vernona Rieger wrote: Reason for CRM: Bertram Millard (wife) called and said that he is in so much pain from his fall in July that he had in the pasture and will call back when he is able to come in for a 3 month follow up with Dr Damita Dunnings. Ruby just wanted Dr Damita Dunnings to be aware. If you want to call Ruby, she said you can @ [phone].

## 2018-07-08 NOTE — Telephone Encounter (Signed)
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 Pt is still seeing ortho but wanted Dr Damita Dunnings to know why pt did not come in for his 3 mth F/U appt with Dr Damita Dunnings. Pt is walking with cane at this time. Pt will come in for diabetic f/u as soon as can.

## 2018-07-09 NOTE — Telephone Encounter (Signed)
Patient notified as instructed by telephone and verbalized understanding. Advised patent to call and schedule an appointment with Dr. Damita Dunnings when he feels up to it and he agreed.

## 2018-07-09 NOTE — Telephone Encounter (Signed)
I'll be glad to see him whenever I can.  I get his point and I he feels better soon.  Thanks.

## 2018-07-16 DIAGNOSIS — M8589 Other specified disorders of bone density and structure, multiple sites: Secondary | ICD-10-CM | POA: Diagnosis not present

## 2018-07-16 DIAGNOSIS — Z13820 Encounter for screening for osteoporosis: Secondary | ICD-10-CM | POA: Diagnosis not present

## 2018-07-24 DIAGNOSIS — S32020D Wedge compression fracture of second lumbar vertebra, subsequent encounter for fracture with routine healing: Secondary | ICD-10-CM | POA: Diagnosis not present

## 2018-07-24 DIAGNOSIS — M810 Age-related osteoporosis without current pathological fracture: Secondary | ICD-10-CM | POA: Diagnosis not present

## 2018-08-28 ENCOUNTER — Encounter: Payer: Self-pay | Admitting: Family Medicine

## 2018-08-28 ENCOUNTER — Ambulatory Visit: Payer: Medicare Other | Admitting: Family Medicine

## 2018-08-28 VITALS — BP 110/56 | HR 79 | Temp 98.8°F | Ht 75.0 in | Wt 205.0 lb

## 2018-08-28 DIAGNOSIS — Z23 Encounter for immunization: Secondary | ICD-10-CM

## 2018-08-28 DIAGNOSIS — I63432 Cerebral infarction due to embolism of left posterior cerebral artery: Secondary | ICD-10-CM

## 2018-08-28 DIAGNOSIS — M8008XS Age-related osteoporosis with current pathological fracture, vertebra(e), sequela: Secondary | ICD-10-CM

## 2018-08-28 DIAGNOSIS — E119 Type 2 diabetes mellitus without complications: Secondary | ICD-10-CM | POA: Diagnosis not present

## 2018-08-28 LAB — CBC WITH DIFFERENTIAL/PLATELET
BASOS PCT: 0.2 % (ref 0.0–3.0)
Basophils Absolute: 0 10*3/uL (ref 0.0–0.1)
EOS PCT: 0 % (ref 0.0–5.0)
Eosinophils Absolute: 0 10*3/uL (ref 0.0–0.7)
HEMATOCRIT: 38.9 % — AB (ref 39.0–52.0)
Hemoglobin: 13 g/dL (ref 13.0–17.0)
Lymphocytes Relative: 6.9 % — ABNORMAL LOW (ref 12.0–46.0)
Lymphs Abs: 1 10*3/uL (ref 0.7–4.0)
MCHC: 33.5 g/dL (ref 30.0–36.0)
MCV: 105 fl — ABNORMAL HIGH (ref 78.0–100.0)
MONO ABS: 1.4 10*3/uL — AB (ref 0.1–1.0)
Monocytes Relative: 9.8 % (ref 3.0–12.0)
NEUTROS PCT: 83.1 % — AB (ref 43.0–77.0)
Neutro Abs: 12 10*3/uL — ABNORMAL HIGH (ref 1.4–7.7)
PLATELETS: 181 10*3/uL (ref 150.0–400.0)
RBC: 3.71 Mil/uL — ABNORMAL LOW (ref 4.22–5.81)
RDW: 14.3 % (ref 11.5–15.5)
WBC: 14.5 10*3/uL — ABNORMAL HIGH (ref 4.0–10.5)

## 2018-08-28 LAB — POCT GLYCOSYLATED HEMOGLOBIN (HGB A1C): Hemoglobin A1C: 5.8 % — AB (ref 4.0–5.6)

## 2018-08-28 LAB — BASIC METABOLIC PANEL
BUN: 22 mg/dL (ref 6–23)
CO2: 30 mEq/L (ref 19–32)
CREATININE: 1.06 mg/dL (ref 0.40–1.50)
Calcium: 9.6 mg/dL (ref 8.4–10.5)
Chloride: 100 mEq/L (ref 96–112)
GFR: 70.66 mL/min (ref >60.00)
Glucose, Bld: 123 mg/dL — ABNORMAL HIGH (ref 70–99)
POTASSIUM: 4.5 meq/L (ref 3.5–5.1)
Sodium: 135 mEq/L (ref 135–145)

## 2018-08-28 NOTE — Progress Notes (Signed)
Diabetes:  Using medications without difficulties: yes Hypoglycemic episodes: no Hyperglycemic episodes:no Feet problems:no Blood Sugars averaging: 90-150s eye exam within last year:yes A1c lower than previous and lower than I expected.  Discussed with patient about checking CBC to make sure he was not anemic and having that artificially depressed his A1c.  See notes on follow-up labs.  CVA. Bruising noted on eliquis.  CBC pending.  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  I advised him not to drive.    Bone density.  Prev seen by Dr. Layne Benton with prolia rec'd by her.  H/o compression fracture.  Discussed options-I will defer to Dr. Layne Benton but her point about Prolia is likely a good option for the patient.  PMH and SH reviewed Meds, vitals, and allergies reviewed.   ROS: Per HPI unless specifically indicated in ROS section   GEN: nad, alert and oriented, using external phonation device at baseline HEENT: mucous membranes moist NECK: supple w/o LA, with chronic post surgical changed noted. CV: IRR PULM: ctab, no inc wob ABD: soft, +bs EXT: trace BLE edema SKIN: well perfused.  Old finger amputations noted.

## 2018-08-28 NOTE — Patient Instructions (Addendum)
Talk to Dr. Layne Benton about prolia.  If she recommends it, then I would advise you to do it.   Flu shot today.   Go to the lab on the way out.  We'll contact you with your lab report. Take care.  Glad to see you.  We may need to change some of your diabetes medicine- you may need less.

## 2018-08-29 ENCOUNTER — Other Ambulatory Visit: Payer: Self-pay | Admitting: Family Medicine

## 2018-08-29 DIAGNOSIS — M8008XA Age-related osteoporosis with current pathological fracture, vertebra(e), initial encounter for fracture: Secondary | ICD-10-CM | POA: Insufficient documentation

## 2018-08-29 DIAGNOSIS — R7989 Other specified abnormal findings of blood chemistry: Secondary | ICD-10-CM

## 2018-08-29 MED ORDER — PIOGLITAZONE HCL 45 MG PO TABS: 22.5000 mg | ORAL_TABLET | Freq: Every day | ORAL | Status: DC

## 2018-08-29 NOTE — Assessment & Plan Note (Signed)
Anticoagulated.  Follow-up CBC pending.  Routine cautions given.  No known gross blood loss.  He does have some expected bruising.  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  I advised him not to drive given his vision limitations.  No change in meds at this point.

## 2018-08-29 NOTE — Assessment & Plan Note (Signed)
  A1c lower than previous and lower than I expected.  Discussed with patient about checking CBC to make sure he was not anemic and having that artificially depressed his A1c.  See notes on follow-up labs.

## 2018-08-29 NOTE — Assessment & Plan Note (Signed)
Discussed options.  Discussed previous bone density test per Dr. Layne Benton.  She had recommended Prolia.  This is a reasonable consideration.  Discussed with patient.  I will defer to patient and Dr. Layne Benton.  I appreciate the help of all involved.

## 2018-09-03 ENCOUNTER — Other Ambulatory Visit (INDEPENDENT_AMBULATORY_CARE_PROVIDER_SITE_OTHER): Payer: Medicare Other

## 2018-09-03 DIAGNOSIS — R7989 Other specified abnormal findings of blood chemistry: Secondary | ICD-10-CM

## 2018-09-03 LAB — CBC WITH DIFFERENTIAL/PLATELET
BASOS ABS: 0.1 10*3/uL (ref 0.0–0.1)
BASOS PCT: 0.7 % (ref 0.0–3.0)
EOS PCT: 1.2 % (ref 0.0–5.0)
Eosinophils Absolute: 0.1 10*3/uL (ref 0.0–0.7)
HCT: 41 % (ref 39.0–52.0)
Hemoglobin: 13.7 g/dL (ref 13.0–17.0)
LYMPHS ABS: 1.4 10*3/uL (ref 0.7–4.0)
Lymphocytes Relative: 18.5 % (ref 12.0–46.0)
MCHC: 33.5 g/dL (ref 30.0–36.0)
MCV: 105.2 fl — AB (ref 78.0–100.0)
Monocytes Absolute: 0.8 10*3/uL (ref 0.1–1.0)
Monocytes Relative: 10.5 % (ref 3.0–12.0)
NEUTROS ABS: 5.2 10*3/uL (ref 1.4–7.7)
NEUTROS PCT: 69.1 % (ref 43.0–77.0)
PLATELETS: 235 10*3/uL (ref 150.0–400.0)
RBC: 3.89 Mil/uL — ABNORMAL LOW (ref 4.22–5.81)
RDW: 13.7 % (ref 11.5–15.5)
WBC: 7.6 10*3/uL (ref 4.0–10.5)

## 2018-09-25 DIAGNOSIS — M545 Low back pain: Secondary | ICD-10-CM | POA: Diagnosis not present

## 2018-09-25 DIAGNOSIS — M810 Age-related osteoporosis without current pathological fracture: Secondary | ICD-10-CM | POA: Diagnosis not present

## 2018-09-25 DIAGNOSIS — M47816 Spondylosis without myelopathy or radiculopathy, lumbar region: Secondary | ICD-10-CM | POA: Diagnosis not present

## 2018-12-04 ENCOUNTER — Encounter: Payer: Self-pay | Admitting: Family Medicine

## 2018-12-04 ENCOUNTER — Ambulatory Visit (INDEPENDENT_AMBULATORY_CARE_PROVIDER_SITE_OTHER): Payer: Medicare Other | Admitting: Family Medicine

## 2018-12-04 ENCOUNTER — Ambulatory Visit (INDEPENDENT_AMBULATORY_CARE_PROVIDER_SITE_OTHER)
Admission: RE | Admit: 2018-12-04 | Discharge: 2018-12-04 | Disposition: A | Discharge status: Home / Self Care | Payer: Medicare Other | Source: Ambulatory Visit | Attending: Family Medicine | Admitting: Family Medicine

## 2018-12-04 VITALS — BP 120/68 | HR 88 | Temp 97.8°F | Wt 209.0 lb

## 2018-12-04 DIAGNOSIS — I63432 Cerebral infarction due to embolism of left posterior cerebral artery: Secondary | ICD-10-CM

## 2018-12-04 DIAGNOSIS — I480 Paroxysmal atrial fibrillation: Secondary | ICD-10-CM | POA: Diagnosis not present

## 2018-12-04 DIAGNOSIS — R8290 Unspecified abnormal findings in urine: Secondary | ICD-10-CM

## 2018-12-04 DIAGNOSIS — C329 Malignant neoplasm of larynx, unspecified: Secondary | ICD-10-CM

## 2018-12-04 DIAGNOSIS — E785 Hyperlipidemia, unspecified: Secondary | ICD-10-CM

## 2018-12-04 DIAGNOSIS — E119 Type 2 diabetes mellitus without complications: Secondary | ICD-10-CM

## 2018-12-04 LAB — POC URINALSYSI DIPSTICK (AUTOMATED)
BILIRUBIN UA: NEGATIVE
Glucose, UA: NEGATIVE
Ketones, UA: NEGATIVE
NITRITE UA: NEGATIVE
PH UA: 6 (ref 5.0–8.0)
PROTEIN UA: POSITIVE — AB
Spec Grav, UA: 1.015 (ref 1.010–1.025)
UROBILINOGEN UA: 0.2 U/dL

## 2018-12-04 LAB — LIPID PANEL
CHOL/HDL RATIO: 2
Cholesterol: 120 mg/dL (ref 0–200)
HDL: 59.5 mg/dL (ref >39.00)
LDL CALC: 47 mg/dL (ref 0–99)
NonHDL: 60.54
Triglycerides: 67 mg/dL (ref 0.0–149.0)
VLDL: 13.4 mg/dL (ref 0.0–40.0)

## 2018-12-04 LAB — COMPREHENSIVE METABOLIC PANEL
ALT: 13 U/L (ref 0–53)
AST: 25 U/L (ref 0–37)
Albumin: 3.9 g/dL (ref 3.5–5.2)
Alkaline Phosphatase: 91 U/L (ref 39–117)
BUN: 25 mg/dL — ABNORMAL HIGH (ref 6–23)
CHLORIDE: 102 meq/L (ref 96–112)
CO2: 27 meq/L (ref 19–32)
Calcium: 10.1 mg/dL (ref 8.4–10.5)
Creatinine, Ser: 1.07 mg/dL (ref 0.40–1.50)
GFR: 69.85 mL/min (ref >60.00)
GLUCOSE: 109 mg/dL — AB (ref 70–99)
POTASSIUM: 4.5 meq/L (ref 3.5–5.1)
Sodium: 138 mEq/L (ref 135–145)
Total Bilirubin: 1 mg/dL (ref 0.2–1.2)
Total Protein: 7.9 g/dL (ref 6.0–8.3)

## 2018-12-04 LAB — CBC WITH DIFFERENTIAL/PLATELET
BASOS ABS: 0.1 10*3/uL (ref 0.0–0.1)
Basophils Relative: 0.7 % (ref 0.0–3.0)
Eosinophils Absolute: 0.1 10*3/uL (ref 0.0–0.7)
Eosinophils Relative: 0.9 % (ref 0.0–5.0)
HCT: 40.1 % (ref 39.0–52.0)
Hemoglobin: 13.2 g/dL (ref 13.0–17.0)
LYMPHS ABS: 1.5 10*3/uL (ref 0.7–4.0)
Lymphocytes Relative: 20.7 % (ref 12.0–46.0)
MCHC: 33 g/dL (ref 30.0–36.0)
MCV: 104.6 fl — ABNORMAL HIGH (ref 78.0–100.0)
MONO ABS: 0.7 10*3/uL (ref 0.1–1.0)
Monocytes Relative: 9.4 % (ref 3.0–12.0)
NEUTROS PCT: 68.3 % (ref 43.0–77.0)
Neutro Abs: 5 10*3/uL (ref 1.4–7.7)
PLATELETS: 193 10*3/uL (ref 150.0–400.0)
RBC: 3.83 Mil/uL — ABNORMAL LOW (ref 4.22–5.81)
RDW: 13.6 % (ref 11.5–15.5)
WBC: 7.3 10*3/uL (ref 4.0–10.5)

## 2018-12-04 LAB — URINALYSIS, MICROSCOPIC ONLY

## 2018-12-04 LAB — HEMOGLOBIN A1C: HEMOGLOBIN A1C: 6.4 % (ref 4.6–6.5)

## 2018-12-04 MED ORDER — RAMIPRIL 5 MG PO CAPS: 5.0000 mg | ORAL_CAPSULE | Freq: Every day | ORAL | 3 refills | Status: DC

## 2018-12-04 MED ORDER — METFORMIN HCL 500 MG PO TABS: 500.0000 mg | ORAL_TABLET | Freq: Two times a day (BID) | ORAL | 3 refills | Status: AC

## 2018-12-04 MED ORDER — SIMVASTATIN 40 MG PO TABS: 40.0000 mg | ORAL_TABLET | Freq: Every day | ORAL | 3 refills | Status: DC

## 2018-12-04 MED ORDER — APIXABAN 5 MG PO TABS: 5.0000 mg | ORAL_TABLET | Freq: Two times a day (BID) | ORAL | 3 refills | Status: DC

## 2018-12-04 MED ORDER — PIOGLITAZONE HCL 45 MG PO TABS: 22.5000 mg | ORAL_TABLET | Freq: Every day | ORAL | Status: DC

## 2018-12-04 MED ORDER — TAMSULOSIN HCL 0.4 MG PO CAPS: 0.4000 mg | ORAL_CAPSULE | Freq: Every day | ORAL | 3 refills | Status: AC

## 2018-12-04 MED ORDER — HYDROCHLOROTHIAZIDE 12.5 MG PO TABS: 12.5000 mg | ORAL_TABLET | Freq: Every day | ORAL | 3 refills | Status: DC

## 2018-12-04 NOTE — Progress Notes (Signed)
Local reaction to prolia, he had some irritation at the injection site.  I asked him to talk to Dr. Layne Benton as it is coming through her clinic.  He fell about 5 weeks ago, had help getting up.  Cautions d/w pt.  Still with some vision loss, cautions d/w pt.  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  Still anticoagulated.  No adverse effect on medication.  No new neurologic symptoms.  Likely still reasonable to continue anticoagulation at this point, with fall cautions discussed.  Diabetes:  Using medications without difficulties:yes Hypoglycemic episodes:no Hyperglycemic episodes:no Feet problems:no Blood Sugars averaging: usually ~135-145 eye exam within last year: yes  Fasting today.  Labs pending.   Hyperlipidemia.  Still on statin.  Due for follow-up labs.  Refill sent.  No adverse effect on medication.  He had some dark urine.  Not burning with urination.  Recent onset.  History of laryngeal cancer.  Due for follow-up chest x-ray.  He still maintaining his tracheostomy.  No new changes.  Still speaking with external phonating.  PMH and SH reviewed  ROS: Per HPI unless specifically indicated in ROS section   Meds, vitals, and allergies reviewed.   GEN: nad, alert and oriented HEENT: mucous membranes moist NECK: supple w/o LA, chronic changes noted.  Tracheostomy in place. CV: IRR, not tachy PULM: ctab, no inc wob ABD: soft, +bs EXT: trace BLE edema SKIN: no acute rash Multiple amputations noted on the fingers.

## 2018-12-04 NOTE — Patient Instructions (Signed)
Go to the lab on the way out.  We'll contact you with your lab report. Don't change your meds for now.  Update me as needed.   Plan on recheck in about 3-4 months.  We can do labs at the visit.  Take care.  Glad to see you.

## 2018-12-05 ENCOUNTER — Encounter: Payer: Self-pay | Admitting: Family Medicine

## 2018-12-05 NOTE — Assessment & Plan Note (Signed)
Still on statin.  Due for follow-up labs.  Refill sent.  No adverse effect on medication.

## 2018-12-05 NOTE — Assessment & Plan Note (Signed)
Given his history of stroke and no excessive bleeding, would still continue anticoagulation at this point.  No neurologic symptoms.  Fall cautions discussed with patient.

## 2018-12-05 NOTE — Assessment & Plan Note (Signed)
See notes on labs.  Controlled on home check.  No change in meds at this point.

## 2018-12-05 NOTE — Assessment & Plan Note (Signed)
No new changes.  Check chest x-ray today.  See notes on imaging.

## 2018-12-05 NOTE — Assessment & Plan Note (Signed)
See above.  Continue anticoagulation for now.

## 2018-12-06 LAB — URINE CULTURE
MICRO NUMBER: 33760
SPECIMEN QUALITY:: ADEQUATE

## 2018-12-08 ENCOUNTER — Other Ambulatory Visit: Payer: Self-pay | Admitting: Family Medicine

## 2018-12-08 MED ORDER — SULFAMETHOXAZOLE-TRIMETHOPRIM 800-160 MG PO TABS: 1.0000 | ORAL_TABLET | Freq: Two times a day (BID) | ORAL | 0 refills | Status: DC

## 2018-12-09 ENCOUNTER — Ambulatory Visit: Payer: Medicare Other | Admitting: Family Medicine

## 2018-12-18 ENCOUNTER — Encounter: Payer: Self-pay | Admitting: Family Medicine

## 2018-12-18 ENCOUNTER — Ambulatory Visit (INDEPENDENT_AMBULATORY_CARE_PROVIDER_SITE_OTHER): Payer: Medicare Other | Admitting: Family Medicine

## 2018-12-18 VITALS — BP 122/70 | HR 91 | Temp 97.9°F | Ht 75.0 in | Wt 212.5 lb

## 2018-12-18 DIAGNOSIS — E119 Type 2 diabetes mellitus without complications: Secondary | ICD-10-CM

## 2018-12-18 DIAGNOSIS — I480 Paroxysmal atrial fibrillation: Secondary | ICD-10-CM

## 2018-12-18 DIAGNOSIS — R319 Hematuria, unspecified: Secondary | ICD-10-CM | POA: Diagnosis not present

## 2018-12-18 LAB — POC URINALSYSI DIPSTICK (AUTOMATED)
Bilirubin, UA: NEGATIVE
Glucose, UA: NEGATIVE
Ketones, UA: NEGATIVE
LEUKOCYTES UA: NEGATIVE
Nitrite, UA: NEGATIVE
PH UA: 6 (ref 5.0–8.0)
Protein, UA: POSITIVE — AB
Spec Grav, UA: 1.02 (ref 1.010–1.025)
Urobilinogen, UA: 0.2 E.U./dL

## 2018-12-18 LAB — CBC WITH DIFFERENTIAL/PLATELET
Basophils Absolute: 0 10*3/uL (ref 0.0–0.1)
Basophils Relative: 0.7 % (ref 0.0–3.0)
EOS PCT: 1.1 % (ref 0.0–5.0)
Eosinophils Absolute: 0.1 10*3/uL (ref 0.0–0.7)
HCT: 38.6 % — ABNORMAL LOW (ref 39.0–52.0)
Hemoglobin: 12.8 g/dL — ABNORMAL LOW (ref 13.0–17.0)
LYMPHS ABS: 1.5 10*3/uL (ref 0.7–4.0)
Lymphocytes Relative: 21.2 % (ref 12.0–46.0)
MCHC: 33.1 g/dL (ref 30.0–36.0)
MCV: 103.8 fl — AB (ref 78.0–100.0)
MONO ABS: 0.7 10*3/uL (ref 0.1–1.0)
MONOS PCT: 10 % (ref 3.0–12.0)
NEUTROS ABS: 4.6 10*3/uL (ref 1.4–7.7)
Neutrophils Relative %: 67 % (ref 43.0–77.0)
PLATELETS: 190 10*3/uL (ref 150.0–400.0)
RBC: 3.72 Mil/uL — ABNORMAL LOW (ref 4.22–5.81)
RDW: 13.4 % (ref 11.5–15.5)
WBC: 6.9 10*3/uL (ref 4.0–10.5)

## 2018-12-18 NOTE — Patient Instructions (Signed)
Go to the lab on the way out.  We'll contact you with your lab report. Stop actos and eliquis.  Let me know about your sugar next week.  It will likely go up some in the meantime.  See David [name] on the way out, after labs.  Take care.  Glad to see you.

## 2018-12-18 NOTE — Progress Notes (Signed)
Hematuria f/u.   Prev ucx positive, treated. Off eliquis now.   Has been on actos.  We had talked about Actos previously.  We talked about the risk versus benefit of the medication.  It has been helpful in terms of controlling his sugar.  He had not had any known adverse effects, of which I was aware.  We talked about his situation with anticoagulation and with hematuria.  In retrospect, he had been bleeding while on eliquis since he started med, months ago.  I did not know this until today, and he admitted that.  He is not having any pain with urination.  We talked about his situation regarding diabetes.  Sugar was 101 this AM.   Meds, vitals, and allergies reviewed.   ROS: Per HPI unless specifically indicated in ROS section   nad Neck with tracheostomy site noted.  Using external phonate at baseline. IRR, not tachy ctab Abdomen soft, nontender. Hands with multiple finger amputations noted. Extremities with trace bilateral lower extremity edema.

## 2018-12-19 LAB — URINE CULTURE
MICRO NUMBER: 95452
RESULT: NO GROWTH
SPECIMEN QUALITY: ADEQUATE

## 2018-12-21 DIAGNOSIS — R319 Hematuria, unspecified: Secondary | ICD-10-CM | POA: Insufficient documentation

## 2018-12-21 NOTE — Assessment & Plan Note (Signed)
Unfortunately I think it makes sense to stop anticoagulation for now and recheck his labs today.  Hold anticoagulation until he has follow-up with urology.  Discussed.  Refer.  He agrees.

## 2018-12-21 NOTE — Assessment & Plan Note (Addendum)
We talked about his situation with anticoagulation and with hematuria.  In retrospect, he had been bleeding while on eliquis since he started med, months ago.  I did not know this until today, and he admitted that.  He is not having any pain with urination.  Previous UTIs treated.    Unclear source.  He agreed to urology evaluation.  Discussed differential diagnosis.  Stop Actos.  Hold anticoagulation for now.  See notes on follow-up labs.  >25 minutes spent in face to face time with patient, >50% spent in counselling or coordination of care

## 2018-12-21 NOTE — Assessment & Plan Note (Signed)
Stop Actos.  He will update me about his sugar if he has significant elevation.  Sugar was controlled at 101 this morning.  He agrees with plan.

## 2018-12-22 DIAGNOSIS — H0100A Unspecified blepharitis right eye, upper and lower eyelids: Secondary | ICD-10-CM | POA: Diagnosis not present

## 2018-12-22 DIAGNOSIS — H0100B Unspecified blepharitis left eye, upper and lower eyelids: Secondary | ICD-10-CM | POA: Diagnosis not present

## 2018-12-22 DIAGNOSIS — H04123 Dry eye syndrome of bilateral lacrimal glands: Secondary | ICD-10-CM | POA: Diagnosis not present

## 2018-12-22 DIAGNOSIS — H53461 Homonymous bilateral field defects, right side: Secondary | ICD-10-CM | POA: Diagnosis not present

## 2019-01-14 DIAGNOSIS — R310 Gross hematuria: Secondary | ICD-10-CM | POA: Diagnosis not present

## 2019-01-14 DIAGNOSIS — N401 Enlarged prostate with lower urinary tract symptoms: Secondary | ICD-10-CM | POA: Diagnosis not present

## 2019-01-14 DIAGNOSIS — N3000 Acute cystitis without hematuria: Secondary | ICD-10-CM | POA: Diagnosis not present

## 2019-01-14 DIAGNOSIS — R3914 Feeling of incomplete bladder emptying: Secondary | ICD-10-CM | POA: Diagnosis not present

## 2019-01-28 DIAGNOSIS — R3914 Feeling of incomplete bladder emptying: Secondary | ICD-10-CM | POA: Diagnosis not present

## 2019-01-28 DIAGNOSIS — N401 Enlarged prostate with lower urinary tract symptoms: Secondary | ICD-10-CM | POA: Diagnosis not present

## 2019-01-28 DIAGNOSIS — R310 Gross hematuria: Secondary | ICD-10-CM | POA: Diagnosis not present

## 2019-03-05 ENCOUNTER — Encounter (HOSPITAL_COMMUNITY): Payer: Self-pay

## 2019-03-05 ENCOUNTER — Emergency Department (HOSPITAL_COMMUNITY): Payer: Medicare Other

## 2019-03-05 ENCOUNTER — Other Ambulatory Visit: Payer: Self-pay

## 2019-03-05 ENCOUNTER — Inpatient Hospital Stay (HOSPITAL_COMMUNITY)
Admission: EM | Admit: 2019-03-05 | Discharge: 2019-03-12 | DRG: 062 | Disposition: A | Discharge status: Rehabilitation Facility | Payer: Medicare Other | Attending: Neurology | Admitting: Neurology

## 2019-03-05 DIAGNOSIS — I639 Cerebral infarction, unspecified: Secondary | ICD-10-CM

## 2019-03-05 DIAGNOSIS — Z87891 Personal history of nicotine dependence: Secondary | ICD-10-CM | POA: Diagnosis not present

## 2019-03-05 DIAGNOSIS — I700 Atherosclerosis of aorta: Secondary | ICD-10-CM | POA: Diagnosis present

## 2019-03-05 DIAGNOSIS — I509 Heart failure, unspecified: Secondary | ICD-10-CM

## 2019-03-05 DIAGNOSIS — I10 Essential (primary) hypertension: Secondary | ICD-10-CM | POA: Diagnosis present

## 2019-03-05 DIAGNOSIS — I4891 Unspecified atrial fibrillation: Secondary | ICD-10-CM | POA: Diagnosis present

## 2019-03-05 DIAGNOSIS — Z8673 Personal history of transient ischemic attack (TIA), and cerebral infarction without residual deficits: Secondary | ICD-10-CM

## 2019-03-05 DIAGNOSIS — I340 Nonrheumatic mitral (valve) insufficiency: Secondary | ICD-10-CM | POA: Diagnosis not present

## 2019-03-05 DIAGNOSIS — I63412 Cerebral infarction due to embolism of left middle cerebral artery: Secondary | ICD-10-CM | POA: Diagnosis not present

## 2019-03-05 DIAGNOSIS — H53461 Homonymous bilateral field defects, right side: Secondary | ICD-10-CM | POA: Diagnosis present

## 2019-03-05 DIAGNOSIS — I110 Hypertensive heart disease with heart failure: Secondary | ICD-10-CM | POA: Diagnosis not present

## 2019-03-05 DIAGNOSIS — Z881 Allergy status to other antibiotic agents status: Secondary | ICD-10-CM

## 2019-03-05 DIAGNOSIS — I69320 Aphasia following cerebral infarction: Secondary | ICD-10-CM | POA: Diagnosis not present

## 2019-03-05 DIAGNOSIS — R29810 Facial weakness: Secondary | ICD-10-CM | POA: Diagnosis present

## 2019-03-05 DIAGNOSIS — Z7984 Long term (current) use of oral hypoglycemic drugs: Secondary | ICD-10-CM | POA: Diagnosis not present

## 2019-03-05 DIAGNOSIS — I69398 Other sequelae of cerebral infarction: Secondary | ICD-10-CM

## 2019-03-05 DIAGNOSIS — I69319 Unspecified symptoms and signs involving cognitive functions following cerebral infarction: Secondary | ICD-10-CM

## 2019-03-05 DIAGNOSIS — Z888 Allergy status to other drugs, medicaments and biological substances status: Secondary | ICD-10-CM

## 2019-03-05 DIAGNOSIS — Z87310 Personal history of (healed) osteoporosis fracture: Secondary | ICD-10-CM | POA: Diagnosis not present

## 2019-03-05 DIAGNOSIS — I5022 Chronic systolic (congestive) heart failure: Secondary | ICD-10-CM | POA: Diagnosis not present

## 2019-03-05 DIAGNOSIS — I63432 Cerebral infarction due to embolism of left posterior cerebral artery: Secondary | ICD-10-CM | POA: Diagnosis present

## 2019-03-05 DIAGNOSIS — R319 Hematuria, unspecified: Secondary | ICD-10-CM | POA: Diagnosis present

## 2019-03-05 DIAGNOSIS — E785 Hyperlipidemia, unspecified: Secondary | ICD-10-CM | POA: Diagnosis not present

## 2019-03-05 DIAGNOSIS — I69351 Hemiplegia and hemiparesis following cerebral infarction affecting right dominant side: Secondary | ICD-10-CM | POA: Diagnosis not present

## 2019-03-05 DIAGNOSIS — E119 Type 2 diabetes mellitus without complications: Secondary | ICD-10-CM | POA: Diagnosis present

## 2019-03-05 DIAGNOSIS — E1159 Type 2 diabetes mellitus with other circulatory complications: Secondary | ICD-10-CM | POA: Diagnosis not present

## 2019-03-05 DIAGNOSIS — F0151 Vascular dementia with behavioral disturbance: Secondary | ICD-10-CM | POA: Diagnosis not present

## 2019-03-05 DIAGNOSIS — I272 Pulmonary hypertension, unspecified: Secondary | ICD-10-CM | POA: Diagnosis not present

## 2019-03-05 DIAGNOSIS — I499 Cardiac arrhythmia, unspecified: Secondary | ICD-10-CM | POA: Diagnosis not present

## 2019-03-05 DIAGNOSIS — R062 Wheezing: Secondary | ICD-10-CM

## 2019-03-05 DIAGNOSIS — G8191 Hemiplegia, unspecified affecting right dominant side: Secondary | ICD-10-CM | POA: Diagnosis present

## 2019-03-05 DIAGNOSIS — N390 Urinary tract infection, site not specified: Secondary | ICD-10-CM | POA: Diagnosis not present

## 2019-03-05 DIAGNOSIS — Z930 Tracheostomy status: Secondary | ICD-10-CM | POA: Diagnosis not present

## 2019-03-05 DIAGNOSIS — R29708 NIHSS score 8: Secondary | ICD-10-CM | POA: Diagnosis present

## 2019-03-05 DIAGNOSIS — Z8521 Personal history of malignant neoplasm of larynx: Secondary | ICD-10-CM | POA: Diagnosis not present

## 2019-03-05 DIAGNOSIS — R399 Unspecified symptoms and signs involving the genitourinary system: Secondary | ICD-10-CM | POA: Diagnosis present

## 2019-03-05 DIAGNOSIS — I491 Atrial premature depolarization: Secondary | ICD-10-CM | POA: Diagnosis not present

## 2019-03-05 DIAGNOSIS — I361 Nonrheumatic tricuspid (valve) insufficiency: Secondary | ICD-10-CM | POA: Diagnosis not present

## 2019-03-05 DIAGNOSIS — R531 Weakness: Secondary | ICD-10-CM | POA: Diagnosis not present

## 2019-03-05 DIAGNOSIS — R0602 Shortness of breath: Secondary | ICD-10-CM

## 2019-03-05 DIAGNOSIS — R339 Retention of urine, unspecified: Secondary | ICD-10-CM | POA: Diagnosis not present

## 2019-03-05 DIAGNOSIS — R0689 Other abnormalities of breathing: Secondary | ICD-10-CM | POA: Diagnosis not present

## 2019-03-05 DIAGNOSIS — I071 Rheumatic tricuspid insufficiency: Secondary | ICD-10-CM | POA: Diagnosis present

## 2019-03-05 DIAGNOSIS — R3121 Asymptomatic microscopic hematuria: Secondary | ICD-10-CM | POA: Diagnosis not present

## 2019-03-05 DIAGNOSIS — D72829 Elevated white blood cell count, unspecified: Secondary | ICD-10-CM | POA: Diagnosis not present

## 2019-03-05 DIAGNOSIS — E870 Hyperosmolality and hypernatremia: Secondary | ICD-10-CM | POA: Diagnosis not present

## 2019-03-05 DIAGNOSIS — R338 Other retention of urine: Secondary | ICD-10-CM | POA: Diagnosis not present

## 2019-03-05 LAB — COMPREHENSIVE METABOLIC PANEL
ALT: 16 U/L (ref 0–44)
AST: 27 U/L (ref 15–41)
Albumin: 3.5 g/dL (ref 3.5–5.0)
Alkaline Phosphatase: 112 U/L (ref 38–126)
Anion gap: 12 (ref 5–15)
BUN: 24 mg/dL — ABNORMAL HIGH (ref 8–23)
CO2: 23 mmol/L (ref 22–32)
Calcium: 9.7 mg/dL (ref 8.9–10.3)
Chloride: 103 mmol/L (ref 98–111)
Creatinine, Ser: 1.29 mg/dL — ABNORMAL HIGH (ref 0.61–1.24)
GFR calc Af Amer: 58 mL/min — ABNORMAL LOW (ref >60)
GFR calc non Af Amer: 50 mL/min — ABNORMAL LOW (ref >60)
Glucose, Bld: 130 mg/dL — ABNORMAL HIGH (ref 70–99)
Potassium: 4 mmol/L (ref 3.5–5.1)
Sodium: 138 mmol/L (ref 135–145)
Total Bilirubin: 1 mg/dL (ref 0.3–1.2)
Total Protein: 7.3 g/dL (ref 6.5–8.1)

## 2019-03-05 LAB — ETHANOL: Alcohol, Ethyl (B): <10 mg/dL (ref <10)

## 2019-03-05 LAB — I-STAT CREATININE, ED: Creatinine, Ser: 1.2 mg/dL (ref 0.61–1.24)

## 2019-03-05 LAB — CBC
HCT: 39.4 % (ref 39.0–52.0)
Hemoglobin: 12.3 g/dL — ABNORMAL LOW (ref 13.0–17.0)
MCH: 32.2 pg (ref 26.0–34.0)
MCHC: 31.2 g/dL (ref 30.0–36.0)
MCV: 103.1 fL — ABNORMAL HIGH (ref 80.0–100.0)
Platelets: 201 10*3/uL (ref 150–400)
RBC: 3.82 MIL/uL — ABNORMAL LOW (ref 4.22–5.81)
RDW: 14.1 % (ref 11.5–15.5)
WBC: 6.6 10*3/uL (ref 4.0–10.5)
nRBC: 0 % (ref 0.0–0.2)

## 2019-03-05 LAB — DIFFERENTIAL
Abs Immature Granulocytes: 0.01 10*3/uL (ref 0.00–0.07)
Basophils Absolute: 0 10*3/uL (ref 0.0–0.1)
Basophils Relative: 1 %
Eosinophils Absolute: 0.1 10*3/uL (ref 0.0–0.5)
Eosinophils Relative: 1 %
Immature Granulocytes: 0 %
Lymphocytes Relative: 20 %
Lymphs Abs: 1.3 10*3/uL (ref 0.7–4.0)
Monocytes Absolute: 0.7 10*3/uL (ref 0.1–1.0)
Monocytes Relative: 10 %
Neutro Abs: 4.5 10*3/uL (ref 1.7–7.7)
Neutrophils Relative %: 68 %

## 2019-03-05 LAB — PROTIME-INR
INR: 1.2 (ref 0.8–1.2)
Prothrombin Time: 15.1 seconds (ref 11.4–15.2)

## 2019-03-05 LAB — CBG MONITORING, ED: Glucose-Capillary: 124 mg/dL — ABNORMAL HIGH (ref 70–99)

## 2019-03-05 LAB — APTT: aPTT: 32 seconds (ref 24–36)

## 2019-03-05 LAB — MRSA PCR SCREENING: MRSA by PCR: NEGATIVE

## 2019-03-05 MED ORDER — SODIUM CHLORIDE 0.9 % IV SOLN
50.0000 mL | Freq: Once | INTRAVENOUS | Status: AC
  Administered 2019-03-05: 50 mL via INTRAVENOUS

## 2019-03-05 MED ORDER — STROKE: EARLY STAGES OF RECOVERY BOOK: Freq: Once | Status: DC

## 2019-03-05 MED ORDER — ACETAMINOPHEN 160 MG/5ML PO SOLN: 650.0000 mg | ORAL | Status: DC | PRN

## 2019-03-05 MED ORDER — ACETAMINOPHEN 325 MG PO TABS: 650.0000 mg | ORAL_TABLET | ORAL | Status: DC | PRN

## 2019-03-05 MED ORDER — DILTIAZEM HCL 25 MG/5ML IV SOLN: 10.0000 mg | Freq: Once | INTRAVENOUS | Status: DC

## 2019-03-05 MED ORDER — SENNOSIDES-DOCUSATE SODIUM 8.6-50 MG PO TABS: 1.0000 | ORAL_TABLET | Freq: Every evening | ORAL | Status: DC | PRN

## 2019-03-05 MED ORDER — PANTOPRAZOLE SODIUM 40 MG IV SOLR
40.0000 mg | Freq: Every day | INTRAVENOUS | Status: DC
  Administered 2019-03-05: 40 mg via INTRAVENOUS
  Filled 2019-03-05: qty 40

## 2019-03-05 MED ORDER — IOHEXOL 350 MG/ML SOLN
100.0000 mL | Freq: Once | INTRAVENOUS | Status: AC | PRN
  Administered 2019-03-05: 100 mL via INTRAVENOUS

## 2019-03-05 MED ORDER — ALTEPLASE (STROKE) FULL DOSE INFUSION
90.0000 mg | Freq: Once | INTRAVENOUS | Status: AC
  Administered 2019-03-05: 90 mg via INTRAVENOUS
  Filled 2019-03-05: qty 100

## 2019-03-05 MED ORDER — METOPROLOL TARTRATE 5 MG/5ML IV SOLN
5.0000 mg | Freq: Once | INTRAVENOUS | Status: AC
  Administered 2019-03-05: 5 mg via INTRAVENOUS
  Filled 2019-03-05: qty 5

## 2019-03-05 MED ORDER — SODIUM CHLORIDE 0.9 % IV SOLN
INTRAVENOUS | Status: DC
  Administered 2019-03-05: 22:00:00 via INTRAVENOUS

## 2019-03-05 MED ORDER — DILTIAZEM HCL 25 MG/5ML IV SOLN
5.0000 mg | Freq: Once | INTRAVENOUS | Status: AC
  Administered 2019-03-05: 5 mg via INTRAVENOUS
  Filled 2019-03-05: qty 5

## 2019-03-05 MED ORDER — ACETAMINOPHEN 650 MG RE SUPP: 650.0000 mg | RECTAL | Status: DC | PRN

## 2019-03-05 NOTE — ED Provider Notes (Signed)
[facility] EMERGENCY DEPARTMENT Provider Note   CSN: [id number] Arrival date & time: 03/05/19  1734  An emergency department physician performed an initial assessment on this suspected stroke patient at 1732.  History   Chief Complaint Chief Complaint  Patient presents with   Code Stroke    HPI PRESTON GARABEDIAN is a 85 y.o. male.     HPI Patient with history of atrial fibrillation not on anticoagulation.  Presents with acute onset right-sided weakness and right facial droop at 1400 today.  Presents as a code stroke by EMS. Past Medical History:  Diagnosis Date   Cancer of neck ([place])    History of atrial fibrillation    Hyperkalemia    with Ramipril dose at 10 mg, K wnl at 5 mg. 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[place])    Right homonymous hemianopsia    /notes 12/09/2017   Stroke ([place]) 11/2017   peripheral vision loss both eyes/notes 12/09/2017   Type II diabetes mellitus ([place])    Urinary retention    Vertebral fracture, osteoporotic ([place])     Patient Active Problem List   Diagnosis Date Noted   Ischemic stroke ([place]) 03/05/2019   Hematuria 12/21/2018   Vertebral fracture, osteoporotic ([place]) 08/29/2018   Advance care planning 03/19/2018   Hemianopia, homonymous, right    Stroke ([place]) 12/09/2017   Loss of peripheral visual field 12/09/2017   Urinary retention    Healthcare maintenance 01/01/2017   Lower urinary tract symptoms (LUTS) 01/01/2017   Tear of biceps muscle 07/17/2016   Medicare annual wellness visit, initial 10/31/2012   Malignant neoplasm of larynx ([place]) 09/22/2007   Diabetes type 2, controlled ([place]) 02/13/2007   HLD (hyperlipidemia) 02/13/2007   Essential hypertension 02/13/2007   ATRIAL FIBRILLATION 02/13/2007    Past Surgical History:  Procedure Laterality Date   AMPUTATION FINGER / THUMB Right    distal amputation x4 fingers (not thumb)    CATARACT EXTRACTION  W/ INTRAOCULAR LENS  IMPLANT, BILATERAL Bilateral 2007   DOPPLER ECHOCARDIOGRAPHY     Atrial fib EF 60%--01/18/1994, 12/02/02--atrial fib, EF 50-55   DOPPLER ECHOCARDIOGRAPHY     ra,la,lv,mild dilat.,trace mitral regurg., mild pulm. htn   PILONIDAL CYST EXCISION     RADICAL NECK DISSECTION Left ~ [place]  ~1993   with left radical neck dissection        Home Medications    Prior to Admission medications   Medication Sig Start Date End Date Taking? Authorizing Provider  hydrochlorothiazide (HYDRODIURIL) 12.5 MG tablet Take 1 tablet (12.5 mg total) by mouth daily. 12/04/18   Tonia Ghent, MD  metFORMIN (GLUCOPHAGE) 500 MG tablet Take 1 tablet (500 mg total) by mouth 2 (two) times daily with a meal. 12/04/18   Tonia Ghent, MD  ramipril (ALTACE) 5 MG capsule Take 1 capsule (5 mg total) by mouth daily. 12/04/18   Tonia Ghent, MD  simvastatin (ZOCOR) 40 MG tablet Take 1 tablet (40 mg total) by mouth at bedtime. 12/04/18   Tonia Ghent, MD  tamsulosin (FLOMAX) 0.4 MG CAPS capsule Take 1 capsule (0.4 mg total) by mouth daily. 12/04/18   Tonia Ghent, MD    Family History Family History  Problem Relation Age of Onset   Cirrhosis Father    Colon cancer Neg Hx    Prostate cancer Neg Hx     Social History Social History  Tobacco Use   Smoking status: Former Smoker    Last attempt to quit: 1993    Years since quitting: 27.2   Smokeless tobacco: Never Used  Substance Use Topics   Alcohol use: No    Alcohol/week: 0.0 standard drinks   Drug use: No     Allergies   Actos [pioglitazone] and Eliquis [apixaban]   Review of Systems Review of Systems  Constitutional: Negative for chills and fever.  HENT: Negative for facial swelling and trouble swallowing.   Respiratory: Negative for shortness of breath.   Cardiovascular: Negative for chest pain.  Gastrointestinal: Negative for abdominal pain, nausea and vomiting.  Skin: Negative for rash  and wound.  Neurological: Positive for weakness. Negative for headaches.  All other systems reviewed and are negative.    Physical Exam Updated Vital Signs BP (!) 136/94    Pulse 75    Temp (!) 97.3 F (36.3 C)    Resp 16    Ht 6\' 2"  (1.88 m)    Wt 104 kg    SpO2 95%    BMI 29.44 kg/m   Physical Exam Vitals signs and nursing note reviewed.  Constitutional:      Appearance: He is well-developed.  HENT:     Head: Normocephalic and atraumatic.  Eyes:     Pupils: Pupils are equal, round, and reactive to light.  Neck:     Musculoskeletal: Normal range of motion and neck supple.     Comments: Trach stoma in place Cardiovascular:     Rate and Rhythm: Normal rate and regular rhythm.  Pulmonary:     Effort: Pulmonary effort is normal.     Breath sounds: Normal breath sounds.  Abdominal:     General: Bowel sounds are normal.     Palpations: Abdomen is soft.     Tenderness: There is no abdominal tenderness. There is no guarding or rebound.  Musculoskeletal: Normal range of motion.        General: No tenderness.  Skin:    General: Skin is warm and dry.     Findings: No erythema or rash.  Neurological:     Mental Status: He is alert and oriented to person, place, and time.     Comments: 3/5 motor in right upper and right lower extremities.  5/5 motor in left upper and left lower extremities.  Psychiatric:        Behavior: Behavior normal.      ED Treatments / Results  Labs (all labs ordered are listed, but only abnormal results are displayed) Labs Reviewed  CBC - Abnormal; Notable for the following components:      Result Value   RBC 3.82 (*)    Hemoglobin 12.3 (*)    MCV 103.1 (*)    All other components within normal limits  COMPREHENSIVE METABOLIC PANEL - Abnormal; Notable for the following components:   Glucose, Bld 130 (*)    BUN 24 (*)    Creatinine, Ser 1.29 (*)    GFR calc non Af Amer 50 (*)    GFR calc Af Amer 58 (*)    All other components within normal  limits  CBG MONITORING, ED - Abnormal; Notable for the following components:   Glucose-Capillary 124 (*)    All other components within normal limits  ETHANOL  PROTIME-INR  APTT  DIFFERENTIAL  RAPID URINE DRUG SCREEN, HOSP PERFORMED  URINALYSIS, ROUTINE W REFLEX MICROSCOPIC  HEMOGLOBIN A1C  LIPID PANEL  I-STAT CREATININE, ED  EKG EKG Interpretation  Date/Time:  Thursday March 05 2019 18:08:08 EDT Ventricular Rate:  121 PR Interval:    QRS Duration: 166 QT Interval:  357 QTC Calculation: 475 R Axis:   -164 Text Interpretation:  Atrial fibrillation Ventricular bigeminy Nonspecific intraventricular conduction delay Borderline ST depression, anterior leads Confirmed by Julianne Rice [phone]) on 03/05/2019 6:57:55 PM   Radiology Ct Angio Head W Or Wo Contrast  Result Date: 03/05/2019 CLINICAL DATA:  Right-sided facial droop and weakness EXAM: CT ANGIOGRAPHY HEAD AND NECK TECHNIQUE: Multidetector CT imaging of the head and neck was performed using the standard protocol during bolus administration of intravenous contrast. Multiplanar CT image reconstructions and MIPs were obtained to evaluate the vascular anatomy. Carotid stenosis measurements (when applicable) are obtained utilizing NASCET criteria, using the distal internal carotid diameter as the denominator. CONTRAST:  139mL OMNIPAQUE IOHEXOL 350 MG/ML SOLN COMPARISON:  None. Head CT 03/05/2019 FINDINGS: CTA NECK FINDINGS SKELETON: There is no bony spinal canal stenosis. No lytic or blastic lesion. OTHER NECK: Prior left neck dissection with tracheostomy. Status post laryngectomy. UPPER CHEST: No pneumothorax or pleural effusion. No nodules or masses. AORTIC ARCH: There is mild calcific atherosclerosis of the aortic arch. There is no aneurysm, dissection or hemodynamically significant stenosis of the visualized ascending aorta and aortic arch. Conventional 3 vessel aortic branching pattern. The visualized proximal subclavian arteries are  widely patent. RIGHT CAROTID SYSTEM: --Common carotid artery: Widely patent origin without common carotid artery dissection or aneurysm. --Internal carotid artery: No dissection, occlusion or aneurysm. Mild atherosclerotic calcification at the carotid bifurcation without hemodynamically significant stenosis. --External carotid artery: No acute abnormality. LEFT CAROTID SYSTEM: --Common carotid artery: Widely patent origin without common carotid artery dissection or aneurysm. --Internal carotid artery: No dissection, occlusion or aneurysm. Mild atherosclerotic calcification at the carotid bifurcation without hemodynamically significant stenosis. --External carotid artery: No acute abnormality. VERTEBRAL ARTERIES: Left dominant configuration. The left vertebral artery is occluded at its origin and throughout the V1 segment. There is opacification beginning at the proximal V2 segment there is moderate narrowing of the left vertebral artery lumen at the C5 level (6:116). The left vertebral artery is otherwise normal to the skull base. No right vertebral stenosis. CTA HEAD FINDINGS POSTERIOR CIRCULATION: --Vertebral arteries: There is moderate calcification of the V4 segment of the left vertebral artery without high-grade stenosis. --Posterior inferior cerebellar arteries (PICA): The left PICA is occluded proximally. Right PICA is patent. --Anterior inferior cerebellar arteries (AICA): Not clearly visualized, though this is not uncommon. --Basilar artery: Normal. --Superior cerebellar arteries: Normal. --Posterior cerebral arteries (PCA): The left posterior cerebral artery is occluded at the proximal P2 segment. The right PCA is patent. ANTERIOR CIRCULATION: --Intracranial internal carotid arteries: Normal. --Anterior cerebral arteries (ACA): Normal. Both A1 segments are present. Patent anterior communicating artery (a-comm). --Middle cerebral arteries (MCA): Normal. VENOUS SINUSES: As permitted by contrast timing,  patent. ANATOMIC VARIANTS: None DELAYED PHASE: No parenchymal contrast enhancement. Review of the MIP images confirms the above findings. IMPRESSION: 1. 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 2. Left PICA occlusion. 3. Occlusion of the left vertebral artery at its origin and throughout the V1 segment with moderate narrowing of the mid V2 segment. 4. Bilateral carotid bifurcation atherosclerosis with less than 50% stenosis. 5.  Aortic atherosclerosis (ICD10-I70.0). 6. Status post laryngectomy, tracheostomy and left neck dissection. Electronically Signed   By: Ulyses Jarred M.D.   On: 03/05/2019 18:23   Ct Angio Neck W Or Wo Contrast  Result Date: 03/05/2019 CLINICAL DATA:  Right-sided facial droop and weakness EXAM: CT ANGIOGRAPHY HEAD AND NECK TECHNIQUE: Multidetector CT imaging of the head and neck was performed using the standard protocol during bolus administration of intravenous contrast. Multiplanar CT image reconstructions and MIPs were obtained to evaluate the vascular anatomy. Carotid stenosis measurements (when applicable) are obtained utilizing NASCET criteria, using the distal internal carotid diameter as the denominator. CONTRAST:  161mL OMNIPAQUE IOHEXOL 350 MG/ML SOLN COMPARISON:  None. Head CT 03/05/2019 FINDINGS: CTA NECK FINDINGS SKELETON: There is no bony spinal canal stenosis. No lytic or blastic lesion. OTHER NECK: Prior left neck dissection with tracheostomy. Status post laryngectomy. UPPER CHEST: No pneumothorax or pleural effusion. No nodules or masses. AORTIC ARCH: There is mild calcific atherosclerosis of the aortic arch. There is no aneurysm, dissection or hemodynamically significant stenosis of the visualized ascending aorta and aortic arch. Conventional 3 vessel aortic branching pattern. The visualized proximal subclavian arteries are widely patent. RIGHT CAROTID SYSTEM: --Common carotid artery: Widely patent origin without common carotid artery  dissection or aneurysm. --Internal carotid artery: No dissection, occlusion or aneurysm. Mild atherosclerotic calcification at the carotid bifurcation without hemodynamically significant stenosis. --External carotid artery: No acute abnormality. LEFT CAROTID SYSTEM: --Common carotid artery: Widely patent origin without common carotid artery dissection or aneurysm. --Internal carotid artery: No dissection, occlusion or aneurysm. Mild atherosclerotic calcification at the carotid bifurcation without hemodynamically significant stenosis. --External carotid artery: No acute abnormality. VERTEBRAL ARTERIES: Left dominant configuration. The left vertebral artery is occluded at its origin and throughout the V1 segment. There is opacification beginning at the proximal V2 segment there is moderate narrowing of the left vertebral artery lumen at the C5 level (6:116). The left vertebral artery is otherwise normal to the skull base. No right vertebral stenosis. CTA HEAD FINDINGS POSTERIOR CIRCULATION: --Vertebral arteries: There is moderate calcification of the V4 segment of the left vertebral artery without high-grade stenosis. --Posterior inferior cerebellar arteries (PICA): The left PICA is occluded proximally. Right PICA is patent. --Anterior inferior cerebellar arteries (AICA): Not clearly visualized, though this is not uncommon. --Basilar artery: Normal. --Superior cerebellar arteries: Normal. --Posterior cerebral arteries (PCA): The left posterior cerebral artery is occluded at the proximal P2 segment. The right PCA is patent. ANTERIOR CIRCULATION: --Intracranial internal carotid arteries: Normal. --Anterior cerebral arteries (ACA): Normal. Both A1 segments are present. Patent anterior communicating artery (a-comm). --Middle cerebral arteries (MCA): Normal. VENOUS SINUSES: As permitted by contrast timing, patent. ANATOMIC VARIANTS: None DELAYED PHASE: No parenchymal contrast enhancement. Review of the MIP images confirms  the above findings. IMPRESSION: 1. 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 2. Left PICA occlusion. 3. Occlusion of the left vertebral artery at its origin and throughout the V1 segment with moderate narrowing of the mid V2 segment. 4. Bilateral carotid bifurcation atherosclerosis with less than 50% stenosis. 5.  Aortic atherosclerosis (ICD10-I70.0). 6. Status post laryngectomy, tracheostomy and left neck dissection. Electronically Signed   By: Ulyses Jarred M.D.   On: 03/05/2019 18:23   Ct Head Code Stroke Wo Contrast  Result Date: 03/05/2019 CLINICAL DATA:  Code stroke. Right facial droop. Right-sided weakness. History of throat cancer. History of stroke. EXAM: CT HEAD WITHOUT CONTRAST TECHNIQUE: Contiguous axial images were obtained from the base of the skull through the vertex without intravenous contrast. COMPARISON:  MRI head 12/09/2017 FINDINGS: Brain: Moderate atrophy. Chronic infarct left occipital lobe. Chronic infarcts in the basal ganglia and white matter bilaterally. Ill-defined hypodensity left medial cerebellum is  new since the prior CT and may represent acute infarct. Additional small areas of chronic infarct left cerebellum have developed since the prior MRI. Small chronic infarct in the right cerebellum. Negative for hemorrhage or mass. Vascular: Negative for hyperdense vessel. Atherosclerotic calcification at the skull base Skull: Negative Sinuses/Orbits: Paranasal sinuses clear. Normal orbit. Bilateral cataract surgery Other: None ASPECTS ([place] Stroke Program Early CT Score) - Ganglionic level infarction (caudate, lentiform nuclei, internal capsule, insula, M1-M3 cortex): 7 - Supraganglionic infarction (M4-M6 cortex): 3 Total score (0-10 with 10 being normal): 10 IMPRESSION: 1. Findings suspicious for a small area of acute infarction left medial cerebellum. 2. Extensive chronic ischemic changes as above. Chronic left PCA infarct. 3. ASPECTS is 10  Electronically Signed   By: Franchot Gallo M.D.   On: 03/05/2019 17:52    Procedures Procedures (including critical care time)  Medications Ordered in ED Medications  alteplase (ACTIVASE) 1 mg/mL infusion 90 mg (90 mg Intravenous New Bag/Given 03/05/19 1755)    Followed by  0.9 %  sodium chloride infusion (has no administration in time range)   stroke: mapping our early stages of recovery book (has no administration in time range)  0.9 %  sodium chloride infusion (has no administration in time range)  acetaminophen (TYLENOL) tablet 650 mg (has no administration in time range)    Or  acetaminophen (TYLENOL) solution 650 mg (has no administration in time range)    Or  acetaminophen (TYLENOL) suppository 650 mg (has no administration in time range)  senna-docusate (Senokot-S) tablet 1 tablet (has no administration in time range)  pantoprazole (PROTONIX) injection 40 mg (has no administration in time range)  iohexol (OMNIPAQUE) 350 MG/ML injection 100 mL (100 mLs Intravenous Contrast Given 03/05/19 1758)     Initial Impression / Assessment and Plan / ED Course  I have reviewed the triage vital signs and the nursing notes.  Pertinent labs & imaging results that were available during my care of the patient were reviewed by me and considered in my medical decision making (see chart for details).        Given TPA by neurology team and will be admitted  Final Clinical Impressions(s) / ED Diagnoses   Final diagnoses:  Acute ischemic stroke [facility])    ED Discharge Orders    None       Julianne Rice, MD 03/05/19 1858

## 2019-03-05 NOTE — H&P (Addendum)
Chief Complaint: Right side weakness  History obtained from: Patient and Chart     HPI:                                                                                                                                       David [name] is an 85 y.o. 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  Was last seen normal at 2 PM by his wife.  History was obtained by EMS.  Patient was noted to have been on the floor and when EMS assisted patient was weaker on the right side.  On arrival stat CT head was obtained which showed no acute findings.  Discussed with patient TPA was administered.   Date last known well: 4. 9.20 Time last known well: 2 PM tPA Given: Yes NIHSS: 8 at time of stroke alert.  Baseline MRS 2    Past Medical History:  Diagnosis Date  . Cancer of neck ([place])   . History of atrial fibrillation   . Hyperkalemia    with Ramipril dose at 10 mg, K wnl at 5 mg. per day  . Hyperlipidemia   . Hypertension    Pulmonary  . Larynx cancer ([place])   . Right homonymous hemianopsia    [name] 12/09/2017  . Stroke ([place]) 11/2017   peripheral vision loss both eyes/notes 12/09/2017  . Type II diabetes mellitus ([place])   . Urinary retention   . Vertebral fracture, osteoporotic [facility])     Past Surgical History:  Procedure Laterality Date  . AMPUTATION FINGER / THUMB Right    distal amputation x4 fingers (not thumb)   . CATARACT EXTRACTION W/ INTRAOCULAR LENS  IMPLANT, BILATERAL Bilateral 2007  . DOPPLER ECHOCARDIOGRAPHY     Atrial fib EF 60%--01/18/1994, 12/02/02--atrial fib, EF 50-55  . DOPPLER ECHOCARDIOGRAPHY     ra,la,lv,mild dilat.,trace mitral regurg., mild pulm. htn  . PILONIDAL CYST EXCISION    . RADICAL NECK DISSECTION Left ~ 1993  . TOTAL LARYNGECTOMY   ~1993   with left radical neck dissection    Family History  Problem Relation Age of Onset  . Cirrhosis Father   . Colon cancer Neg Hx   . Prostate cancer Neg Hx    Social History:  reports that he quit smoking about 27 years ago. He has never used smokeless tobacco. He reports that he does not drink alcohol or use drugs.  Allergies:  Allergies  Allergen Reactions  . Actos [Pioglitazone] Other (See Comments)    Hematuria (Blood in the urine) STOPPED BY MD  . Eliquis [Apixaban] Other (See Comments)    Hematuria (Blood in the urine) STOPPED BY MD    Medications:  I reviewed home medications.  No anticoagulants listed on med list   ROS:                                                                                                                                     14 systems reviewed and negative except above   Examination:                                                                                                      General: Appears well-developed Psych: Affect appropriate to situation Eyes: No scleral injection HENT: Tracheostomy scar with  stoma Head: Normocephalic.  Cardiovascular: Normal rate and regular rhythm.  Respiratory: Effort normal and breath sounds normal to anterior ascultation GI: Soft.  No distension. There is no tenderness.  Skin: WDI    Neurological Examination Mental Status: Alert, oriented, thought content appropriate.  Speech : Comprehension was intact.  Patient was able to mouth words out.  Able to follow commands without difficulty. Cranial Nerves: II: Visual fields: Impaired temporal visual fields in the right eye III,IV, VI: ptosis not present, extra-ocular motions intact bilaterally, pupils equal, round, reactive to light and accommodation V,VII: Mild right facial droop VIII: hearing normal bilaterally IX,X: uvula rises  symmetrically XI: bilateral shoulder shrug XII: midline tongue extension Motor: Right : Upper extremity   2/5    Left:     Upper extremity   5/5  Lower extremity   4/5     Lower extremity   5/5 Tone and bulk:normal tone throughout; no atrophy noted Sensory: Reduced sensation to light touch on the right arm and leg.  No neglect Deep Tendon Reflexes: 2+ and symmetric throughout Plantars: Right: downgoing   Left: downgoing Cerebellar: normal finger-to-noseon the left side Gait: Not assessed     Lab Results: Basic Metabolic Panel: Recent Labs  Lab 03/05/19 1738 03/05/19 1745  NA 138  --   K 4.0  --   CL 103  --   CO2 23  --   GLUCOSE 130*  --   BUN 24*  --   CREATININE 1.29* 1.20  CALCIUM 9.7  --     CBC: Recent Labs  Lab 03/05/19 1738  WBC 6.6  NEUTROABS 4.5  HGB 12.3*  HCT 39.4  MCV 103.1*  PLT 201    Coagulation Studies: Recent Labs    03/05/19 1738  LABPROT 15.1  INR 1.2    Imaging: Ct Angio Head W Or Wo Contrast  Result Date: 03/05/2019 CLINICAL DATA:  Right-sided facial droop and weakness EXAM: CT ANGIOGRAPHY HEAD AND NECK TECHNIQUE: Multidetector CT imaging of the head and neck was performed using the standard protocol during bolus administration of intravenous contrast. Multiplanar CT image reconstructions and MIPs were obtained to evaluate the vascular anatomy. Carotid stenosis measurements (when applicable) are obtained utilizing NASCET criteria, using the distal internal carotid diameter as the denominator. CONTRAST:  145mL OMNIPAQUE IOHEXOL 350 MG/ML SOLN COMPARISON:  None. Head CT 03/05/2019 FINDINGS: CTA NECK FINDINGS SKELETON: There is no bony spinal canal stenosis. No lytic or blastic lesion. OTHER NECK: Prior left neck dissection with tracheostomy. Status post laryngectomy. UPPER CHEST: No pneumothorax or pleural effusion. No nodules or masses. AORTIC ARCH: There is mild calcific atherosclerosis of the aortic arch. There is no aneurysm, dissection  or hemodynamically significant stenosis of the visualized ascending aorta and aortic arch. Conventional 3 vessel aortic branching pattern. The visualized proximal subclavian arteries are widely patent. RIGHT CAROTID SYSTEM: --Common carotid artery: Widely patent origin without common carotid artery dissection or aneurysm. --Internal carotid artery: No dissection, occlusion or aneurysm. Mild atherosclerotic calcification at the carotid bifurcation without hemodynamically significant stenosis. --External carotid artery: No acute abnormality. LEFT CAROTID SYSTEM: --Common carotid artery: Widely patent origin without common carotid artery dissection or aneurysm. --Internal carotid artery: No dissection, occlusion or aneurysm. Mild atherosclerotic calcification at the carotid bifurcation without hemodynamically significant stenosis. --External carotid artery: No acute abnormality. VERTEBRAL ARTERIES: Left dominant configuration. The left vertebral artery is occluded at its origin and throughout the V1 segment. There is opacification beginning at the proximal V2 segment there is moderate narrowing of the left vertebral artery lumen at the C5 level (6:116). The left vertebral artery is otherwise normal to the skull base. No right vertebral stenosis. CTA HEAD FINDINGS POSTERIOR CIRCULATION: --Vertebral arteries: There is moderate calcification of the V4 segment of the left vertebral artery without high-grade stenosis. --Posterior inferior cerebellar arteries (PICA): The left PICA is occluded proximally. Right PICA is patent. --Anterior inferior cerebellar arteries (AICA): Not clearly visualized, though this is not uncommon. --Basilar artery: Normal. --Superior cerebellar arteries: Normal. --Posterior cerebral arteries (PCA): The left posterior cerebral artery is occluded at the proximal P2 segment. The right PCA is patent. ANTERIOR CIRCULATION: --Intracranial internal carotid arteries: Normal. --Anterior cerebral arteries  (ACA): Normal. Both A1 segments are present. Patent anterior communicating artery (a-comm). --Middle cerebral arteries (MCA): Normal. VENOUS SINUSES: As permitted by contrast timing, patent. ANATOMIC VARIANTS: None DELAYED PHASE: No parenchymal contrast enhancement. Review of the MIP images confirms the above findings. IMPRESSION: 1. 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 2. Left PICA occlusion. 3. Occlusion of the left vertebral artery at its origin and throughout the V1 segment with moderate narrowing of the mid V2 segment. 4. Bilateral carotid bifurcation atherosclerosis with less than 50% stenosis. 5.  Aortic atherosclerosis (ICD10-I70.0). 6. Status post laryngectomy, tracheostomy and left neck dissection. Electronically Signed   By: Ulyses Jarred M.D.   On: 03/05/2019 18:23   Ct Angio Neck W Or Wo Contrast  Result Date: 03/05/2019 CLINICAL DATA:  Right-sided facial droop and weakness EXAM: CT ANGIOGRAPHY HEAD AND NECK TECHNIQUE: Multidetector CT imaging of the head and neck was performed using the standard protocol during bolus administration of intravenous contrast. Multiplanar CT image reconstructions and MIPs were obtained to evaluate the vascular anatomy. Carotid stenosis measurements (when applicable) are obtained utilizing NASCET criteria, using the distal internal carotid diameter as the denominator. CONTRAST:  137mL OMNIPAQUE IOHEXOL 350 MG/ML SOLN  COMPARISON:  None. Head CT 03/05/2019 FINDINGS: CTA NECK FINDINGS SKELETON: There is no bony spinal canal stenosis. No lytic or blastic lesion. OTHER NECK: Prior left neck dissection with tracheostomy. Status post laryngectomy. UPPER CHEST: No pneumothorax or pleural effusion. No nodules or masses. AORTIC ARCH: There is mild calcific atherosclerosis of the aortic arch. There is no aneurysm, dissection or hemodynamically significant stenosis of the visualized ascending aorta and aortic arch. Conventional 3 vessel aortic  branching pattern. The visualized proximal subclavian arteries are widely patent. RIGHT CAROTID SYSTEM: --Common carotid artery: Widely patent origin without common carotid artery dissection or aneurysm. --Internal carotid artery: No dissection, occlusion or aneurysm. Mild atherosclerotic calcification at the carotid bifurcation without hemodynamically significant stenosis. --External carotid artery: No acute abnormality. LEFT CAROTID SYSTEM: --Common carotid artery: Widely patent origin without common carotid artery dissection or aneurysm. --Internal carotid artery: No dissection, occlusion or aneurysm. Mild atherosclerotic calcification at the carotid bifurcation without hemodynamically significant stenosis. --External carotid artery: No acute abnormality. VERTEBRAL ARTERIES: Left dominant configuration. The left vertebral artery is occluded at its origin and throughout the V1 segment. There is opacification beginning at the proximal V2 segment there is moderate narrowing of the left vertebral artery lumen at the C5 level (6:116). The left vertebral artery is otherwise normal to the skull base. No right vertebral stenosis. CTA HEAD FINDINGS POSTERIOR CIRCULATION: --Vertebral arteries: There is moderate calcification of the V4 segment of the left vertebral artery without high-grade stenosis. --Posterior inferior cerebellar arteries (PICA): The left PICA is occluded proximally. Right PICA is patent. --Anterior inferior cerebellar arteries (AICA): Not clearly visualized, though this is not uncommon. --Basilar artery: Normal. --Superior cerebellar arteries: Normal. --Posterior cerebral arteries (PCA): The left posterior cerebral artery is occluded at the proximal P2 segment. The right PCA is patent. ANTERIOR CIRCULATION: --Intracranial internal carotid arteries: Normal. --Anterior cerebral arteries (ACA): Normal. Both A1 segments are present. Patent anterior communicating artery (a-comm). --Middle cerebral arteries  (MCA): Normal. VENOUS SINUSES: As permitted by contrast timing, patent. ANATOMIC VARIANTS: None DELAYED PHASE: No parenchymal contrast enhancement. Review of the MIP images confirms the above findings. IMPRESSION: 1. 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 2. Left PICA occlusion. 3. Occlusion of the left vertebral artery at its origin and throughout the V1 segment with moderate narrowing of the mid V2 segment. 4. Bilateral carotid bifurcation atherosclerosis with less than 50% stenosis. 5.  Aortic atherosclerosis (ICD10-I70.0). 6. Status post laryngectomy, tracheostomy and left neck dissection. Electronically Signed   By: Ulyses Jarred M.D.   On: 03/05/2019 18:23   Ct Head Code Stroke Wo Contrast  Result Date: 03/05/2019 CLINICAL DATA:  Code stroke. Right facial droop. Right-sided weakness. History of throat cancer. History of stroke. EXAM: CT HEAD WITHOUT CONTRAST TECHNIQUE: Contiguous axial images were obtained from the base of the skull through the vertex without intravenous contrast. COMPARISON:  MRI head 12/09/2017 FINDINGS: Brain: Moderate atrophy. Chronic infarct left occipital lobe. Chronic infarcts in the basal ganglia and white matter bilaterally. Ill-defined hypodensity left medial cerebellum is new since the prior CT and may represent acute infarct. Additional small areas of chronic infarct left cerebellum have developed since the prior MRI. Small chronic infarct in the right cerebellum. Negative for hemorrhage or mass. Vascular: Negative for hyperdense vessel. Atherosclerotic calcification at the skull base Skull: Negative Sinuses/Orbits: Paranasal sinuses clear. Normal orbit. Bilateral cataract surgery Other: None ASPECTS ([place] Stroke Program Early CT Score) - Ganglionic level infarction (caudate, lentiform nuclei, internal capsule, insula, M1-M3  cortex): 7 - Supraganglionic infarction (M4-M6 cortex): 3 Total score (0-10 with 10 being normal): 10 IMPRESSION: 1.  Findings suspicious for a small area of acute infarction left medial cerebellum. 2. Extensive chronic ischemic changes as above. Chronic left PCA infarct. 3. ASPECTS is 10 Electronically Signed   By: Franchot Gallo M.D.   On: 03/05/2019 17:52     ASSESSMENT AND PLAN  85 y.o. male with past medical history of laryngeal cancer status post tracheostomy, hyperlipidemia, hypertension, type 2 diabetes mellitus, prior right PCA stroke with right homonymous hemianopsia, atrial fibrillation not on anticoagulation. CT head negative for hemorrhage, demonstrated old right PCA infarct.  Patient initially seen by Dr. Cornelius Moras, took over the case and assessed patient before TPA was administered.  Patient denied history of GI bleed, recent surgery.  Med list negative for anticoagulants.  Patient understood risk of 6% major intracranial hemorrhage versus 10 to 20% chance of improved recovery.  TPA was administered.  CTA was obtained to rule out large vessel occlusion-showed no large vessel occlusion.  He does have occlusion of the left vertebral artery at the origin and left PICA occlusion and Left PCA occlusion-this all could be chronic given his old PCA infarct.  Etiology of stroke needs to be evaluated, SPECT cardioembolic given history of A. Fib, however may be small vessel disease as well.  MRI brain with certainly help differentiate between the two.   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 Old left PCA infarct with encephalomalacia  Plan # MRI of the brain without contrast #Transthoracic Echo  #No antiplatelets until 24 hours post TPA after negative head CT for hemorrhage #Atorvastatin 40 mg daily after swallow evaluation # BP goal: Permissive up to 180 /105 mmHg # HBAIC and Lipid profile # Telemetry monitoring # Frequent neuro checks # NPO until passes stroke swallow screen  Atrial fibrillation -May need to start  anticoagulation, timing will depend on size of infarct -May also be poor candidate if he has recurrent falls  Laryngeal cancer Diabetes mellitus: Sliding scale insulin, A1c ordered Hypertension: BP goal as above Suspected heart failure: Gentle IV fluids, will order BNP-currently compensated   Please page stroke NP  Or  PA  Or MD from 8am -4 pm  as this patient from this time will be  followed by the stroke.   You can look them up on [email or web address]  Password [email or web address]    This patient is neurologically critically ill due to stroke status post TPA.  He is at risk for significant risk of neurological worsening from cerebral edema,  death from brain herniation, heart failure, hemorrhagic conversion, infection, respiratory failure and seizure. This patient's care requires constant monitoring of vital signs, hemodynamics, respiratory and cardiac monitoring, review of multiple databases, neurological assessment, discussion with family (spoke with wife over the phone), other specialists and medical decision making of high complexity.  I spent 60 minutes of neurocritical time in the care of this patient.      Shakhia Gramajo [facility] Pager Number [phone]

## 2019-03-05 NOTE — Progress Notes (Signed)
Patient's HR in Afib 110-120s with frequent PVCs. Notified Dr. Rory Percy. One time dose of 5 mg metoprolol ordered.

## 2019-03-05 NOTE — Progress Notes (Signed)
Pharmacist Code Stroke Response  Notified to mix tPA at 1744 by Dr. Erlinda Hong Delivered tPA to RN at 1748  tPA dose = 9mg  bolus over 1 minute followed by 81mg  for a total dose of 90mg  over 1 hour  Issues/delays encountered (if applicable): Blood pressure required treatment and CTA was done too  David [name], David [name] 03/05/19 5:54 PM

## 2019-03-05 NOTE — ED Notes (Signed)
ED TO INPATIENT HANDOFF REPORT  ED Nurse Name and Phone #: Abbe Amsterdam [phone]  S Name/Age/Gender David [name] 85 y.o. male Room/Bed: RESUSC/RESUSC  Code Status   Code Status: Full Code  Home/SNF/Other Home Patient oriented to: self, place and situation Is this baseline? Yes   Triage Complete: Triage complete  Chief Complaint CODE STROKE   Triage Note Pt who is usually active presented with right sided weakness and right sided facial droop. Vital sights per ems was BP:184/82, HR 64, TEMP 99.4. Pt also presents with afib and PVC's.   Allergies Allergies  Allergen Reactions  . Actos [Pioglitazone] Other (See Comments)    hematuria  . Eliquis [Apixaban] Other (See Comments)    hematuria    Level of Care/Admitting Diagnosis ED Disposition    ED Disposition Condition Comment   Admit  Hospital Area: [facility] [[id number]]  Level of Care: ICU [6]  Diagnosis: Ischemic stroke [facility]) [[id number]]  Admitting Physician: Radford Pax [[id number]]  Attending Physician: Radford Pax [[id number]]  Estimated length of stay: 3 - 4 days  Certification:: I certify this patient will need inpatient services for at least 2 midnights  PT Class (Do Not Modify): Inpatient [101]  PT Acc Code (Do Not Modify): Private [1]       B Medical/Surgery History Past Medical History:  Diagnosis Date  . Cancer of neck ([place])   . History of atrial fibrillation   . Hyperkalemia    with Ramipril dose at 10 mg, K wnl at 5 mg. per day  . Hyperlipidemia   . Hypertension    Pulmonary  . Larynx cancer ([place])   . Right homonymous hemianopsia    [name] 12/09/2017  . Stroke ([place]) 11/2017   peripheral vision loss both eyes/notes 12/09/2017  . Type II diabetes mellitus ([place])   . Urinary retention   . Vertebral fracture, osteoporotic [facility])    Past Surgical History:  Procedure Laterality Date  . AMPUTATION FINGER / THUMB Right    distal amputation x4 fingers (not thumb)   . CATARACT  EXTRACTION W/ INTRAOCULAR LENS  IMPLANT, BILATERAL Bilateral 2007  . DOPPLER ECHOCARDIOGRAPHY     Atrial fib EF 60%--01/18/1994, 12/02/02--atrial fib, EF 50-55  . DOPPLER ECHOCARDIOGRAPHY     ra,la,lv,mild dilat.,trace mitral regurg., mild pulm. htn  . PILONIDAL CYST EXCISION    . RADICAL NECK DISSECTION Left ~ 1993  . TOTAL LARYNGECTOMY  ~1993   with left radical neck dissection     A IV Location/Drains/Wounds Patient Lines/Drains/Airways Status   Active Line/Drains/Airways    Name:   Placement date:   Placement time:   Site:   Days:   Peripheral IV 03/05/19 Left Wrist   03/05/19    1739    Wrist   less than 1          Intake/Output Last 24 hours No intake or output data in the 24 hours ending 03/05/19 1856  Labs/Imaging Results for orders placed or performed during the hospital encounter of 03/05/19 (from the past 56 hour(s))  CBG monitoring, ED     Status: Abnormal   Collection Time: 03/05/19  5:35 PM  Result Value Ref Range   Glucose-Capillary 124 (H) 70 - 99 mg/dL   Comment 1 Notify RN    Comment 2 Document in Chart   Protime-INR     Status: None   Collection Time: 03/05/19  5:38 PM  Result Value Ref Range   Prothrombin Time 15.1 11.4 - 15.2 seconds  INR 1.2 0.8 - 1.2    Comment: (NOTE) INR goal varies based on device and disease states. Performed at [facility], [place] [address], [place], [place] [address]   APTT     Status: None   Collection Time: 03/05/19  5:38 PM  Result Value Ref Range   aPTT 32 24 - 36 seconds    Comment: Performed at [facility] [address], [place], [place] [address]  CBC     Status: Abnormal   Collection Time: 03/05/19  5:38 PM  Result Value Ref Range   WBC 6.6 4.0 - 10.5 K/uL   RBC 3.82 (L) 4.22 - 5.81 MIL/uL   Hemoglobin 12.3 (L) 13.0 - 17.0 g/dL   HCT 39.4 39.0 - 52.0 %   MCV 103.1 (H) 80.0 - 100.0 fL   MCH 32.2 26.0 - 34.0 pg   MCHC 31.2 30.0 - 36.0 g/dL   RDW 14.1 11.5 - 15.5 %   Platelets 201 150 - 400 K/uL    nRBC 0.0 0.0 - 0.2 %    Comment: Performed at [facility], [place] [address] [place], [place] [address]  Differential     Status: None   Collection Time: 03/05/19  5:38 PM  Result Value Ref Range   Neutrophils Relative % 68 %   Neutro Abs 4.5 1.7 - 7.7 K/uL   Lymphocytes Relative 20 %   Lymphs Abs 1.3 0.7 - 4.0 K/uL   Monocytes Relative 10 %   Monocytes Absolute 0.7 0.1 - 1.0 K/uL   Eosinophils Relative 1 %   Eosinophils Absolute 0.1 0.0 - 0.5 K/uL   Basophils Relative 1 %   Basophils Absolute 0.0 0.0 - 0.1 K/uL   Immature Granulocytes 0 %   Abs Immature Granulocytes 0.01 0.00 - 0.07 K/uL    Comment: Performed at [place] [address], [place], [place] [address]  Comprehensive metabolic panel     Status: Abnormal   Collection Time: 03/05/19  5:38 PM  Result Value Ref Range   Sodium 138 135 - 145 mmol/L   Potassium 4.0 3.5 - 5.1 mmol/L   Chloride 103 98 - 111 mmol/L   CO2 23 22 - 32 mmol/L   Glucose, Bld 130 (H) 70 - 99 mg/dL   BUN 24 (H) 8 - 23 mg/dL   Creatinine, Ser 1.29 (H) 0.61 - 1.24 mg/dL   Calcium 9.7 8.9 - 10.3 mg/dL   Total Protein 7.3 6.5 - 8.1 g/dL   Albumin 3.5 3.5 - 5.0 g/dL   AST 27 15 - 41 U/L   ALT 16 0 - 44 U/L   Alkaline Phosphatase 112 38 - 126 U/L   Total Bilirubin 1.0 0.3 - 1.2 mg/dL   GFR calc non Af Amer 50 (L) >60 mL/min   GFR calc Af Amer 58 (L) >60 mL/min   Anion gap 12 5 - 15    Comment: Performed at [facility] [address], [place], [place] [address]  Ethanol     Status: None   Collection Time: 03/05/19  5:39 PM  Result Value Ref Range   Alcohol, Ethyl (B) <10 <10 mg/dL    Comment: (NOTE) Lowest detectable limit for serum alcohol is 10 mg/dL. For medical purposes only. Performed at [facility], [place] [address], [place], [place] [address]   I-stat Creatinine, ED     Status: None   Collection Time: 03/05/19  5:45 PM  Result Value Ref Range   Creatinine, Ser 1.20 0.61 - 1.24 mg/dL   Ct Angio Head W Or  Wo Contrast  Result Date: 03/05/2019 CLINICAL DATA:  Right-sided facial droop and weakness EXAM: CT ANGIOGRAPHY HEAD AND NECK TECHNIQUE: Multidetector CT imaging of the head and neck was performed using the standard protocol during bolus administration of intravenous contrast. Multiplanar CT image reconstructions and MIPs were obtained to evaluate the vascular anatomy. Carotid stenosis measurements (when applicable) are obtained utilizing NASCET criteria, using the distal internal carotid diameter as the denominator. CONTRAST:  162mL OMNIPAQUE IOHEXOL 350 MG/ML SOLN COMPARISON:  None. Head CT 03/05/2019 FINDINGS: CTA NECK FINDINGS SKELETON: There is no bony spinal canal stenosis. No lytic or blastic lesion. OTHER NECK: Prior left neck dissection with tracheostomy. Status post laryngectomy. UPPER CHEST: No pneumothorax or pleural effusion. No nodules or masses. AORTIC ARCH: There is mild calcific atherosclerosis of the aortic arch. There is no aneurysm, dissection or hemodynamically significant stenosis of the visualized ascending aorta and aortic arch. Conventional 3 vessel aortic branching pattern. The visualized proximal subclavian arteries are widely patent. RIGHT CAROTID SYSTEM: --Common carotid artery: Widely patent origin without common carotid artery dissection or aneurysm. --Internal carotid artery: No dissection, occlusion or aneurysm. Mild atherosclerotic calcification at the carotid bifurcation without hemodynamically significant stenosis. --External carotid artery: No acute abnormality. LEFT CAROTID SYSTEM: --Common carotid artery: Widely patent origin without common carotid artery dissection or aneurysm. --Internal carotid artery: No dissection, occlusion or aneurysm. Mild atherosclerotic calcification at the carotid bifurcation without hemodynamically significant stenosis. --External carotid artery: No acute abnormality. VERTEBRAL ARTERIES: Left dominant configuration. The left vertebral artery is  occluded at its origin and throughout the V1 segment. There is opacification beginning at the proximal V2 segment there is moderate narrowing of the left vertebral artery lumen at the C5 level (6:116). The left vertebral artery is otherwise normal to the skull base. No right vertebral stenosis. CTA HEAD FINDINGS POSTERIOR CIRCULATION: --Vertebral arteries: There is moderate calcification of the V4 segment of the left vertebral artery without high-grade stenosis. --Posterior inferior cerebellar arteries (PICA): The left PICA is occluded proximally. Right PICA is patent. --Anterior inferior cerebellar arteries (AICA): Not clearly visualized, though this is not uncommon. --Basilar artery: Normal. --Superior cerebellar arteries: Normal. --Posterior cerebral arteries (PCA): The left posterior cerebral artery is occluded at the proximal P2 segment. The right PCA is patent. ANTERIOR CIRCULATION: --Intracranial internal carotid arteries: Normal. --Anterior cerebral arteries (ACA): Normal. Both A1 segments are present. Patent anterior communicating artery (a-comm). --Middle cerebral arteries (MCA): Normal. VENOUS SINUSES: As permitted by contrast timing, patent. ANATOMIC VARIANTS: None DELAYED PHASE: No parenchymal contrast enhancement. Review of the MIP images confirms the above findings. IMPRESSION: 1. 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 2. Left PICA occlusion. 3. Occlusion of the left vertebral artery at its origin and throughout the V1 segment with moderate narrowing of the mid V2 segment. 4. Bilateral carotid bifurcation atherosclerosis with less than 50% stenosis. 5.  Aortic atherosclerosis (ICD10-I70.0). 6. Status post laryngectomy, tracheostomy and left neck dissection. Electronically Signed   By: Ulyses Jarred M.D.   On: 03/05/2019 18:23   Ct Angio Neck W Or Wo Contrast  Result Date: 03/05/2019 CLINICAL DATA:  Right-sided facial droop and weakness EXAM: CT ANGIOGRAPHY HEAD  AND NECK TECHNIQUE: Multidetector CT imaging of the head and neck was performed using the standard protocol during bolus administration of intravenous contrast. Multiplanar CT image reconstructions and MIPs were obtained to evaluate  the vascular anatomy. Carotid stenosis measurements (when applicable) are obtained utilizing NASCET criteria, using the distal internal carotid diameter as the denominator. CONTRAST:  160mL OMNIPAQUE IOHEXOL 350 MG/ML SOLN COMPARISON:  None. Head CT 03/05/2019 FINDINGS: CTA NECK FINDINGS SKELETON: There is no bony spinal canal stenosis. No lytic or blastic lesion. OTHER NECK: Prior left neck dissection with tracheostomy. Status post laryngectomy. UPPER CHEST: No pneumothorax or pleural effusion. No nodules or masses. AORTIC ARCH: There is mild calcific atherosclerosis of the aortic arch. There is no aneurysm, dissection or hemodynamically significant stenosis of the visualized ascending aorta and aortic arch. Conventional 3 vessel aortic branching pattern. The visualized proximal subclavian arteries are widely patent. RIGHT CAROTID SYSTEM: --Common carotid artery: Widely patent origin without common carotid artery dissection or aneurysm. --Internal carotid artery: No dissection, occlusion or aneurysm. Mild atherosclerotic calcification at the carotid bifurcation without hemodynamically significant stenosis. --External carotid artery: No acute abnormality. LEFT CAROTID SYSTEM: --Common carotid artery: Widely patent origin without common carotid artery dissection or aneurysm. --Internal carotid artery: No dissection, occlusion or aneurysm. Mild atherosclerotic calcification at the carotid bifurcation without hemodynamically significant stenosis. --External carotid artery: No acute abnormality. VERTEBRAL ARTERIES: Left dominant configuration. The left vertebral artery is occluded at its origin and throughout the V1 segment. There is opacification beginning at the proximal V2 segment there  is moderate narrowing of the left vertebral artery lumen at the C5 level (6:116). The left vertebral artery is otherwise normal to the skull base. No right vertebral stenosis. CTA HEAD FINDINGS POSTERIOR CIRCULATION: --Vertebral arteries: There is moderate calcification of the V4 segment of the left vertebral artery without high-grade stenosis. --Posterior inferior cerebellar arteries (PICA): The left PICA is occluded proximally. Right PICA is patent. --Anterior inferior cerebellar arteries (AICA): Not clearly visualized, though this is not uncommon. --Basilar artery: Normal. --Superior cerebellar arteries: Normal. --Posterior cerebral arteries (PCA): The left posterior cerebral artery is occluded at the proximal P2 segment. The right PCA is patent. ANTERIOR CIRCULATION: --Intracranial internal carotid arteries: Normal. --Anterior cerebral arteries (ACA): Normal. Both A1 segments are present. Patent anterior communicating artery (a-comm). --Middle cerebral arteries (MCA): Normal. VENOUS SINUSES: As permitted by contrast timing, patent. ANATOMIC VARIANTS: None DELAYED PHASE: No parenchymal contrast enhancement. Review of the MIP images confirms the above findings. IMPRESSION: 1. 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 2. Left PICA occlusion. 3. Occlusion of the left vertebral artery at its origin and throughout the V1 segment with moderate narrowing of the mid V2 segment. 4. Bilateral carotid bifurcation atherosclerosis with less than 50% stenosis. 5.  Aortic atherosclerosis (ICD10-I70.0). 6. Status post laryngectomy, tracheostomy and left neck dissection. Electronically Signed   By: Ulyses Jarred M.D.   On: 03/05/2019 18:23   Ct Head Code Stroke Wo Contrast  Result Date: 03/05/2019 CLINICAL DATA:  Code stroke. Right facial droop. Right-sided weakness. History of throat cancer. History of stroke. EXAM: CT HEAD WITHOUT CONTRAST TECHNIQUE: Contiguous axial images were obtained  from the base of the skull through the vertex without intravenous contrast. COMPARISON:  MRI head 12/09/2017 FINDINGS: Brain: Moderate atrophy. Chronic infarct left occipital lobe. Chronic infarcts in the basal ganglia and white matter bilaterally. Ill-defined hypodensity left medial cerebellum is new since the prior CT and may represent acute infarct. Additional small areas of chronic infarct left cerebellum have developed since the prior MRI. Small chronic infarct in the right cerebellum. Negative for hemorrhage or mass. Vascular: Negative for hyperdense vessel. Atherosclerotic calcification at the skull base Skull:  Negative Sinuses/Orbits: Paranasal sinuses clear. Normal orbit. Bilateral cataract surgery Other: None ASPECTS ([place] Stroke Program Early CT Score) - Ganglionic level infarction (caudate, lentiform nuclei, internal capsule, insula, M1-M3 cortex): 7 - Supraganglionic infarction (M4-M6 cortex): 3 Total score (0-10 with 10 being normal): 10 IMPRESSION: 1. Findings suspicious for a small area of acute infarction left medial cerebellum. 2. Extensive chronic ischemic changes as above. Chronic left PCA infarct. 3. ASPECTS is 10 Electronically Signed   By: Franchot Gallo M.D.   On: 03/05/2019 17:52    Pending Labs Unresulted Labs (From admission, onward)    Start     Ordered   03/06/19 0500  Hemoglobin A1c  Tomorrow morning,   R     03/05/19 1823   03/06/19 0500  Lipid panel  Tomorrow morning,   R    Comments:  Fasting    03/05/19 1823   03/05/19 1735  Urine rapid drug screen (hosp performed)  ONCE - STAT,   STAT     03/05/19 1735   03/05/19 1735  Urinalysis, Routine w reflex microscopic  ONCE - STAT,   STAT     03/05/19 1735          Vitals/Pain Today's Vitals   03/05/19 1840 03/05/19 1845 03/05/19 1850 03/05/19 1852  BP: (!) 136/94     Pulse: 75     Resp: 19 16    Temp: (!) 97.3 F (36.3 C)     SpO2: 95%     Weight:    104 kg  Height:    6\' 2"  (1.88 m)  PainSc:   0-No pain      Isolation Precautions No active isolations  Medications Medications  alteplase (ACTIVASE) 1 mg/mL infusion 90 mg (90 mg Intravenous New Bag/Given 03/05/19 1755)    Followed by  0.9 %  sodium chloride infusion (has no administration in time range)   stroke: mapping our early stages of recovery book (has no administration in time range)  0.9 %  sodium chloride infusion (has no administration in time range)  acetaminophen (TYLENOL) tablet 650 mg (has no administration in time range)    Or  acetaminophen (TYLENOL) solution 650 mg (has no administration in time range)    Or  acetaminophen (TYLENOL) suppository 650 mg (has no administration in time range)  senna-docusate (Senokot-S) tablet 1 tablet (has no administration in time range)  pantoprazole (PROTONIX) injection 40 mg (has no administration in time range)  iohexol (OMNIPAQUE) 350 MG/ML injection 100 mL (100 mLs Intravenous Contrast Given 03/05/19 1758)    Mobility walks with device High fall risk   Focused Assessments Neuro Assessment Handoff:  Swallow screen pass? No    NIH Stroke Scale ( + Modified Stroke Scale Criteria)  Interval: Other (Comment)(tpa) Level of Consciousness (1a.)   : Alert, keenly responsive LOC Questions (1b. )   +: Answers one question correctly LOC Commands (1c. )   + : Performs both tasks correctly Best Gaze (2. )  +: Normal Visual (3. )  +: Partial hemianopia Facial Palsy (4. )    : Minor paralysis Motor Arm, Left (5a. )   +: No drift Motor Arm, Right (5b. )   +: No drift Motor Leg, Left (6a. )   +: No drift Motor Leg, Right (6b. )   +: Drift Limb Ataxia (7. ): Absent Sensory (8. )   +: Normal, no sensory loss Best Language (9. )   +: No aphasia Dysarthria (10. ): Normal Extinction/Inattention (11.)   +:  No Abnormality Modified SS Total  +: 3 Complete NIHSS TOTAL: 4 Last date known well: 03/05/19 Last time known well: 1400 Neuro Assessment: Exceptions to [facility] Neuro Checks:   Other  (Comment)(TPA) (03/05/19 1810)  Last Documented NIHSS Modified Score: 3 (03/05/19 1840) Has TPA been given? Yes Temp: 97.3 F (36.3 C) (04/09 1840) BP: 136/94 (04/09 1840) Pulse Rate: 75 (04/09 1840) If patient is a Neuro Trauma and patient is going to OR before floor call report to [facility] nurse: [phone] or [phone]     R Recommendations: See Admitting Provider Note  Report given to:   Additional Notes:laryngectomy with stoma, improvement of weakness and facial droop with TPA

## 2019-03-05 NOTE — ED Notes (Signed)
TAKEN AT 1825

## 2019-03-05 NOTE — ED Triage Notes (Signed)
Pt who is usually active presented with right sided weakness and right sided facial droop. Vital sights per ems was BP:184/82, HR 64, TEMP 99.4. Pt also presents with afib and PVC's.

## 2019-03-05 NOTE — ED Notes (Signed)
David [name] @ [phone] pts wife would like an update soon as possible please

## 2019-03-06 ENCOUNTER — Inpatient Hospital Stay (HOSPITAL_COMMUNITY): Payer: Medicare Other

## 2019-03-06 DIAGNOSIS — E785 Hyperlipidemia, unspecified: Secondary | ICD-10-CM

## 2019-03-06 DIAGNOSIS — I10 Essential (primary) hypertension: Secondary | ICD-10-CM

## 2019-03-06 DIAGNOSIS — I63412 Cerebral infarction due to embolism of left middle cerebral artery: Principal | ICD-10-CM

## 2019-03-06 DIAGNOSIS — I4891 Unspecified atrial fibrillation: Secondary | ICD-10-CM

## 2019-03-06 DIAGNOSIS — Z8673 Personal history of transient ischemic attack (TIA), and cerebral infarction without residual deficits: Secondary | ICD-10-CM

## 2019-03-06 DIAGNOSIS — E1159 Type 2 diabetes mellitus with other circulatory complications: Secondary | ICD-10-CM

## 2019-03-06 DIAGNOSIS — I361 Nonrheumatic tricuspid (valve) insufficiency: Secondary | ICD-10-CM

## 2019-03-06 DIAGNOSIS — I340 Nonrheumatic mitral (valve) insufficiency: Secondary | ICD-10-CM

## 2019-03-06 DIAGNOSIS — I509 Heart failure, unspecified: Secondary | ICD-10-CM

## 2019-03-06 DIAGNOSIS — R319 Hematuria, unspecified: Secondary | ICD-10-CM

## 2019-03-06 LAB — URINALYSIS, ROUTINE W REFLEX MICROSCOPIC
Bilirubin Urine: NEGATIVE
Glucose, UA: NEGATIVE mg/dL
Hgb urine dipstick: NEGATIVE
Ketones, ur: NEGATIVE mg/dL
Nitrite: POSITIVE — AB
Protein, ur: NEGATIVE mg/dL
Specific Gravity, Urine: 1.024 (ref 1.005–1.030)
pH: 5 (ref 5.0–8.0)

## 2019-03-06 LAB — POCT I-STAT 7, (LYTES, BLD GAS, ICA,H+H)
Acid-base deficit: 3 mmol/L — ABNORMAL HIGH (ref 0.0–2.0)
Bicarbonate: 21.3 mmol/L (ref 20.0–28.0)
Calcium, Ion: 1.26 mmol/L (ref 1.15–1.40)
HCT: 41 % (ref 39.0–52.0)
Hemoglobin: 13.9 g/dL (ref 13.0–17.0)
O2 Saturation: 96 %
Patient temperature: 97.6
Potassium: 4.4 mmol/L (ref 3.5–5.1)
Sodium: 138 mmol/L (ref 135–145)
TCO2: 22 mmol/L (ref 22–32)
pCO2 arterial: 34 mmHg (ref 32.0–48.0)
pH, Arterial: 7.402 (ref 7.350–7.450)
pO2, Arterial: 79 mmHg — ABNORMAL LOW (ref 83.0–108.0)

## 2019-03-06 LAB — LIPID PANEL
Cholesterol: 92 mg/dL (ref 0–200)
HDL: 48 mg/dL (ref >40)
LDL Cholesterol: 38 mg/dL (ref 0–99)
Total CHOL/HDL Ratio: 1.9 RATIO
Triglycerides: 30 mg/dL (ref <150)
VLDL: 6 mg/dL (ref 0–40)

## 2019-03-06 LAB — RAPID URINE DRUG SCREEN, HOSP PERFORMED
Amphetamines: NOT DETECTED
Barbiturates: NOT DETECTED
Benzodiazepines: NOT DETECTED
Cocaine: NOT DETECTED
Opiates: NOT DETECTED
Tetrahydrocannabinol: NOT DETECTED

## 2019-03-06 LAB — ECHOCARDIOGRAM COMPLETE
Height: 75 in
Weight: 3668.45 oz

## 2019-03-06 LAB — HEMOGLOBIN A1C
Hgb A1c MFr Bld: 6.6 % — ABNORMAL HIGH (ref 4.8–5.6)
Mean Plasma Glucose: 142.72 mg/dL

## 2019-03-06 LAB — MAGNESIUM: Magnesium: 1.9 mg/dL (ref 1.7–2.4)

## 2019-03-06 MED ORDER — METOPROLOL TARTRATE 12.5 MG HALF TABLET
12.5000 mg | ORAL_TABLET | Freq: Two times a day (BID) | ORAL | Status: DC
  Administered 2019-03-06 – 2019-03-12 (×13): 12.5 mg via ORAL
  Filled 2019-03-06 (×13): qty 1

## 2019-03-06 MED ORDER — HALOPERIDOL LACTATE 5 MG/ML IJ SOLN
2.0000 mg | Freq: Three times a day (TID) | INTRAMUSCULAR | Status: DC | PRN
  Administered 2019-03-06 – 2019-03-09 (×3): 2 mg via INTRAVENOUS
  Filled 2019-03-06 (×3): qty 1

## 2019-03-06 MED ORDER — PANTOPRAZOLE SODIUM 40 MG PO TBEC
40.0000 mg | DELAYED_RELEASE_TABLET | Freq: Every day | ORAL | Status: DC
  Administered 2019-03-06 – 2019-03-12 (×7): 40 mg via ORAL
  Filled 2019-03-06 (×7): qty 1

## 2019-03-06 MED ORDER — SIMVASTATIN 20 MG PO TABS: 40.0000 mg | ORAL_TABLET | Freq: Every day | ORAL | Status: DC

## 2019-03-06 MED ORDER — ASPIRIN 300 MG RE SUPP
300.0000 mg | Freq: Once | RECTAL | Status: AC
  Administered 2019-03-06: 300 mg via RECTAL
  Filled 2019-03-06: qty 1

## 2019-03-06 MED ORDER — TAMSULOSIN HCL 0.4 MG PO CAPS
0.4000 mg | ORAL_CAPSULE | Freq: Two times a day (BID) | ORAL | Status: DC
  Administered 2019-03-06 – 2019-03-12 (×13): 0.4 mg via ORAL
  Filled 2019-03-06 (×13): qty 1

## 2019-03-06 MED ORDER — QUETIAPINE FUMARATE 25 MG PO TABS
25.0000 mg | ORAL_TABLET | Freq: Once | ORAL | Status: AC
  Administered 2019-03-06: 25 mg via ORAL
  Filled 2019-03-06: qty 1

## 2019-03-06 MED ORDER — SIMVASTATIN 20 MG PO TABS
20.0000 mg | ORAL_TABLET | Freq: Every day | ORAL | Status: DC
  Administered 2019-03-06 – 2019-03-11 (×6): 20 mg via ORAL
  Filled 2019-03-06 (×6): qty 1

## 2019-03-06 MED ORDER — LORAZEPAM 2 MG/ML IJ SOLN
2.0000 mg | Freq: Once | INTRAMUSCULAR | Status: AC
  Administered 2019-03-06: 2 mg via INTRAVENOUS
  Filled 2019-03-06: qty 1

## 2019-03-06 NOTE — Evaluation (Signed)
Speech Language Pathology Evaluation Patient Details Name: David [name] MRN: [id number] DOB: [birth year] Today's Date: 03/06/2019 Time: 7253-6644 SLP Time Calculation (min) (ACUTE ONLY): 35 min  Problem List:  Patient Active Problem List   Diagnosis Date Noted  . Ischemic stroke ([place]) 03/05/2019  . Hematuria 12/21/2018  . Vertebral fracture, osteoporotic ([place]) 08/29/2018  . Advance care planning 03/19/2018  . Hemianopia, homonymous, right   . Stroke ([place]) 12/09/2017  . Loss of peripheral visual field 12/09/2017  . Urinary retention   . Healthcare maintenance 01/01/2017  . Lower urinary tract symptoms (LUTS) 01/01/2017  . Tear of biceps muscle 07/17/2016  . Medicare annual wellness visit, initial 10/31/2012  . Malignant neoplasm of larynx ([place]) 09/22/2007  . Diabetes type 2, controlled ([place]) 02/13/2007  . HLD (hyperlipidemia) 02/13/2007  . Essential hypertension 02/13/2007  . ATRIAL FIBRILLATION 02/13/2007   Past Medical History:  Past Medical History:  Diagnosis Date  . Cancer of neck ([place])   . History of atrial fibrillation   . Hyperkalemia    with Ramipril dose at 10 mg, K wnl at 5 mg. per day  . Hyperlipidemia   . Hypertension    Pulmonary  . Larynx cancer ([place])   . Right homonymous hemianopsia    [name] 12/09/2017  . Stroke ([place]) 11/2017   peripheral vision loss both eyes/notes 12/09/2017  . Type II diabetes mellitus ([place])   . Urinary retention   . Vertebral fracture, osteoporotic [facility])    Past Surgical History:  Past Surgical History:  Procedure Laterality Date  . AMPUTATION FINGER / THUMB Right    distal amputation x4 fingers (not thumb)   . CATARACT EXTRACTION W/ INTRAOCULAR LENS  IMPLANT, BILATERAL Bilateral 2007  . DOPPLER ECHOCARDIOGRAPHY     Atrial fib EF 60%--01/18/1994, 12/02/02--atrial fib, EF 50-55  . DOPPLER ECHOCARDIOGRAPHY     ra,la,lv,mild dilat.,trace mitral regurg., mild pulm. htn  . PILONIDAL CYST EXCISION    . RADICAL NECK DISSECTION Left ~  1993  . TOTAL LARYNGECTOMY  ~1993   with left radical neck dissection   HPI:  Pt is an 85 y.o. male with past medical history of laryngeal cancer status post tracheostomy with stoma and use of electrolarynx for communication, hyperlipidemia, hypertension, type 2 diabetes mellitus, prior right PCA stroke with right homonymous hemianopsia, atrial fibrillation who presented the ER as a code stroke. The CT of the head showed findings suspicious for a small area of acute infarction left medial cerebellum. Extensive chronic ischemic changes as above. Chronic left PCA infarct.   Assessment / Plan / Recommendation Clinical Impression  Pt denied any baseline or new deficits in speech, language, or cognition and reported that he was managing his own finances and finances. Pt's wife, Bertram Millard, was contacted via phone to corroborate these reports and provide additional information regarding the pt's prior level of function. Pt's wife reported that the pt has been having cognitive deficits for at least a year and has been taking Pravegen with no significant change in function. She cited difficulty with medication manegement, money management, word retrieval, and memory. Per the pt's wife, she has been managing the pt's finances and medications for at least the past year. She further indicated that there have been frequent instances when the pt cannot retrieve the names of streets or complete his sentences and that he becomes upset easily. The evaluation revealed difficulty with naming, memory, and problem solving. Based on the wife's report, the pt appears to be at his baseline with regards to  speech, language, and cognition. Skilled SLP services will therefore be discontinued at this time.     SLP Assessment  SLP Recommendation/Assessment: Patient does not need any further Speech Lanaguage Pathology Services SLP Visit Diagnosis: Cognitive communication deficit (R41.841)    Follow Up Recommendations  None     Frequency and Duration           SLP Evaluation Cognition  Overall Cognitive Status: Difficult to assess Arousal/Alertness: Awake/alert Orientation Level: Oriented to person;Oriented to place;Disoriented to time;Disoriented to situation Attention: Focused;Sustained Focused Attention: Appears intact Sustained Attention: Appears intact Awareness: Impaired Awareness Impairment: Intellectual impairment Problem Solving: Impaired Problem Solving Impairment: Verbal complex(1/3)       Comprehension  Auditory Comprehension Overall Auditory Comprehension: Appears within functional limits for tasks assessed Yes/No Questions: Impaired Basic Biographical Questions: (4/5) Complex Questions: (5/5) Paragraph Comprehension (via yes/no questions): (3/4) Commands: Impaired One Step Basic Commands: (4/4) Two Step Basic Commands: (3/4) Multistep Basic Commands: (4/4) Visual Recognition/Discrimination Discrimination: Exceptions to [facility] Pictures: Unable to indentify(0/5) Reading Comprehension Reading Status: Not tested    Expression Expression Primary Mode of Expression: Verbal Verbal Expression Overall Verbal Expression: Appears within functional limits for tasks assessed Initiation: No impairment Automatic Speech: Counting;Day of week;Month of year(Counting: 10/10; Days: 7/7; Months: 8/12 (pt stopped at Aug)) Level of Generative/Spontaneous Verbalization: Sentence Repetition: No impairment(Sentence: 4/5) Naming: Impairment Responsive: (3/4) Confrontation: (2/10) Pictures: Unable to indentify(0/5) Convergent: (Sentence completion: 3.5)   Oral / Motor  Oral Motor/Sensory Function Overall Oral Motor/Sensory Function: Within functional limits Motor Speech Overall Motor Speech: Other (comment)(Pt uses eletrolarynx for speech production s/p laryngeal ca)   Magon Croson I. Hardin Negus, [place], [place] Office number [phone] Pager [place] 03/06/2019, 10:00 AM

## 2019-03-06 NOTE — Progress Notes (Signed)
  Echocardiogram 2D Echocardiogram has been performed.  David [name] 03/06/2019, 10:30 AM

## 2019-03-06 NOTE — Progress Notes (Signed)
Patient having increased work of breathing, wheezing, agitated. Dr. Rory Percy placed new orders for blood gas and pcxr. Will monitor closely

## 2019-03-06 NOTE — Progress Notes (Signed)
SLP Cancellation Note  Patient Details Name: David [name] MRN: [id number] DOB: 1932/05/01   Cancelled treatment:       Reason Eval/Treat Not Completed: Patient at procedure or test/unavailable. Pt currently being cleaned by nurse tech. SLP will follow up.   Thayer Inabinet I. Hardin Negus, [place], [place] Office number [phone] Pager [place] 03/06/2019, 8:53 AM

## 2019-03-06 NOTE — Evaluation (Signed)
Occupational Therapy Evaluation Patient Details Name: David [name] MRN: [id number] DOB: 07-15-1932 Today's Date: 03/06/2019    History of Present Illness David [name] is an 85 y.o. 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  CT showing potential infarct L medial cerebellum   Clinical Impression   This 85 y/o male presents with the above. At baseline pt reports he is mod independent with ADL and functional mobility using SPC. Pt presents seated in recliner this session, restless and wanting to move. Pt with decreased insight into current deficits and somewhat impulsive during session, requires increased cues for safety and to follow commands throughout. Pt has voicebox to assist with communicating with therapist during session, though overall using gestures and mouthing words throughout. Pt with RUE discoordination and ataxic movements noted. He required modA+2 [facility]) for stand pivot transfer recliner to EOB during session. Pt currently requires modA for UB ADL, max-totalA(+2) for LB ADL. He will benefit from continued acute OT services and feel he is appropriate for CIR level therapy services at time of discharge to maximize his safety and independence with ADL and mobility prior to return home. Will follow.     Follow Up Recommendations  CIR;Supervision/Assistance - 24 hour    Equipment Recommendations  Other (comment)(TBA)    Recommendations for Other Services Rehab consult     Precautions / Restrictions Precautions Precautions: Fall Restrictions Weight Bearing Restrictions: No      Mobility Bed Mobility Overal bed mobility: Needs Assistance Bed Mobility: Sit to Supine       Sit to supine: Mod assist;+2 for safety/equipment;+2 for physical assistance    General bed mobility comments: assist to guide trunk and for LEs onto EOB  Transfers Overall transfer level: Needs assistance   Transfers: Sit to/from Stand;Stand Pivot Transfers Sit to Stand: Mod assist;+2 physical assistance;+2 safety/equipment Stand pivot transfers: Mod assist;+2 physical assistance;+2 safety/equipment       General transfer comment: attempted sit<>stand to RW with +1 assist and pt with increased difficulty completing; use of two person HHA for transfer completion; cues for hand placement, coming forward. assist both forward and up. and stability once up. pt noted to have RLE instability and R foot tending to slide forward with attempts to stand requiring assist to  block foot for increased safety; cues and two person steadying assist provided for taking pivotal steps to EOB from recliner     Balance Overall balance assessment: Needs assistance Sitting-balance support: No upper extremity supported;Bilateral upper extremity supported;Feet supported Sitting balance-Leahy Scale: Fair Sitting balance - Comments: any challenge and pt starts to list posteriorly     Standing balance-Leahy Scale: Poor Standing balance comment: needs external support                           ADL either performed or assessed with clinical judgement   ADL Overall ADL's : Needs assistance/impaired Eating/Feeding: Minimal assistance;Sitting   Grooming: Minimal assistance;Sitting   Upper Body Bathing: Minimal assistance;Sitting   Lower Body Bathing: Moderate assistance;+2 for physical assistance;+2 for safety/equipment;Sit to/from stand   Upper Body Dressing : Sitting;Minimal assistance   Lower Body Dressing: +2 for physical assistance;Maximal assistance;+2 for safety/equipment;Sit to/from stand Lower Body Dressing Details (indicate cue type and reason): modA +2 for standing balance Toilet Transfer: Moderate assistance;+2 for  physical assistance;+2 for  safety/equipment;Stand-pivot Toilet Transfer Details (indicate cue type and reason): simulated via transfer to EOB from recliner Toileting- Clothing Manipulation and Hygiene: Maximal assistance;+2 for physical assistance;+2 for safety/equipment;Sit to/from stand       Functional mobility during ADLs: Moderate assistance;+2 for physical assistance;+2 for safety/equipment(stand pivot transfers) General ADL Comments: pt with impaired cognition, decreased coordination, impaired standing balance and decreased activity tolerance     Vision Baseline Vision/History: Wears glasses Wears Glasses: At all times Patient Visual Report: No change from baseline Vision Assessment?: Yes Eye Alignment: Within Functional Limits Ocular Range of Motion: Impaired-to be further tested in functional context Alignment/Gaze Preference: Within Defined Limits Tracking/Visual Pursuits: Decreased smoothness of horizontal tracking Additional Comments: per chart review pt with visual deficits at baseline - to be further assessed      Perception     Praxis      Pertinent Vitals/Pain Pain Assessment: No/denies pain     Hand Dominance Right   Extremity/Trunk Assessment Upper Extremity Assessment Upper Extremity Assessment: RUE deficits/detail RUE Deficits / Details: some ataxic movements noted; discoordination; pt with amputated digits (baseline)   RUE Coordination: decreased fine motor   Lower Extremity Assessment Lower Extremity Assessment: Defer to PT evaluation       Communication Communication Communication: Expressive difficulties(has stoma; voice box)   Cognition Arousal/Alertness: Awake/alert Behavior During Therapy: WFL for tasks assessed/performed;Impulsive Overall Cognitive Status: No family/caregiver present to determine baseline cognitive functioning Area of Impairment: Attention;Memory;Following commands;Safety/judgement;Awareness                   Current Attention Level:  Sustained Memory: Decreased short-term memory Following Commands: Follows one step commands inconsistently;Follows one step commands with increased time Safety/Judgement: Decreased awareness of safety;Decreased awareness of deficits Awareness: Intellectual   General Comments: pt somewhat impulsive and requires cues for safety, requiring cues/assist from nursing staff prior to start of session to remain seated in recliner as pt attempting to get up on his own; pt with decreased insight into current deficits as he attempts to get up unassisted but needed external assist for safe mobility completion; pt also with difficulty following commands without additional cues   General Comments  HR up to 120s with transfer, otherwise VSS    Exercises     Shoulder Instructions      Home Living Family/patient expects to be discharged to:: Private residence Living Arrangements: Spouse/significant other Available Help at Discharge: Available 24 hours/day Type of Home: House Home Access: Stairs to enter [employer] of Steps: several Entrance Stairs-Rails: Right;Left Home Layout: One level     Bathroom Shower/Tub: Occupational psychologist: Standard            Lives With: Spouse    Prior Functioning/Environment Level of Independence: Independent;Independent with assistive device(s)        Comments: reports intermittent use of SPC        OT Problem List: Decreased strength;Decreased range of motion;Decreased activity tolerance;Impaired balance (sitting and/or standing);Decreased coordination;Decreased safety awareness;Decreased knowledge of use of DME or AE;Decreased cognition;Impaired vision/perception      OT Treatment/Interventions: Self-care/ADL training;Therapeutic exercise;Neuromuscular education;Energy conservation;DME and/or AE instruction;Therapeutic activities;Cognitive remediation/compensation;Patient/family education;Balance training;Visual/perceptual  remediation/compensation    OT Goals(Current goals can be found in the care plan section) Acute Rehab OT Goals Patient Stated Goal: pt wanting to get up and move start of session OT Goal Formulation: With patient Time For Goal Achievement: 03/20/19 Potential to Achieve Goals: Good  OT Frequency: Min 3X/week   Barriers  to D/C:            Co-evaluation              AM-PAC OT "6 Clicks" Daily Activity     Outcome Measure Help from another person eating meals?: A Little Help from another person taking care of personal grooming?: A Little Help from another person toileting, which includes using toliet, bedpan, or urinal?: A Lot Help from another person bathing (including washing, rinsing, drying)?: A Lot Help from another person to put on and taking off regular upper body clothing?: A Lot Help from another person to put on and taking off regular lower body clothing?: Total 6 Click Score: 13   End of Session Equipment Utilized During Treatment: Gait belt Nurse Communication: Mobility status  Activity Tolerance: Patient tolerated treatment well Patient left: in bed;with call bell/phone within reach;with bed alarm set  OT Visit Diagnosis: Unsteadiness on feet (R26.81);Other abnormalities of gait and mobility (R26.89)                Time: 1610-9604 OT Time Calculation (min): 23 min Charges:  OT General Charges $OT Visit: 1 Visit OT Evaluation $OT Eval Moderate Complexity: 1 Mod OT Treatments $Self Care/Home Management : 8-22 mins  Lou Cal, OT Supplemental Rehabilitation Services Pager [phone] Office [place] 03/06/2019, 3:41 PM

## 2019-03-06 NOTE — Progress Notes (Addendum)
STROKE TEAM PROGRESS NOTE   INTERVAL HISTORY Pt lying in bed, awake alert and without complains. He was able to communicate with voice box and no aphasia. Still has mild right UE weakness and ataxia but improved from yesterday. Still has chronic right hemianopia. His eliquis was discontinued in 11/2018 due to hematuria. However, he stated that his hematuria not bad, just pink color. He was referred to urology with Dr. Diona Fanti and found to have UTI and treated twice with Abx, but [facility] has not been restarted yet.   Vitals:   03/06/19 0800 03/06/19 0900 03/06/19 1000 03/06/19 1100  BP: (!) 122/101 (!) 123/95 116/74 (!) 129/102  Pulse: (!) 125  (!) 117   Resp: (!) 32  (!) 21   Temp:  97.8 F (36.6 C)    TempSrc:  Oral    SpO2: (!) 89%  91%   Weight:      Height:        CBC:  Recent Labs  Lab 03/05/19 1738  WBC 6.6  NEUTROABS 4.5  HGB 12.3*  HCT 39.4  MCV 103.1*  PLT 229    Basic Metabolic Panel:  Recent Labs  Lab 03/05/19 1738 03/05/19 1745 03/06/19 0222  NA 138  --   --   K 4.0  --   --   CL 103  --   --   CO2 23  --   --   GLUCOSE 130*  --   --   BUN 24*  --   --   CREATININE 1.29* 1.20  --   CALCIUM 9.7  --   --   MG  --   --  1.9   Lipid Panel:     Component Value Date/Time   CHOL 92 03/06/2019 0222   TRIG 30 03/06/2019 0222   TRIG 85 12/05/2006 1045   HDL 48 03/06/2019 0222   CHOLHDL 1.9 03/06/2019 0222   VLDL 6 03/06/2019 0222   LDLCALC 38 03/06/2019 0222   HgbA1c:  Lab Results  Component Value Date   HGBA1C 6.6 (H) 03/06/2019   Urine Drug Screen:     Component Value Date/Time   LABOPIA NONE DETECTED 03/06/2019 0004   COCAINSCRNUR NONE DETECTED 03/06/2019 0004   LABBENZ NONE DETECTED 03/06/2019 0004   AMPHETMU NONE DETECTED 03/06/2019 0004   THCU NONE DETECTED 03/06/2019 0004   LABBARB NONE DETECTED 03/06/2019 0004    Alcohol Level     Component Value Date/Time   ETH <10 03/05/2019 1739    IMAGING Ct Angio Head W Or Wo Contrast  Result  Date: 03/05/2019 CLINICAL DATA:  Right-sided facial droop and weakness EXAM: CT ANGIOGRAPHY HEAD AND NECK TECHNIQUE: Multidetector CT imaging of the head and neck was performed using the standard protocol during bolus administration of intravenous contrast. Multiplanar CT image reconstructions and MIPs were obtained to evaluate the vascular anatomy. Carotid stenosis measurements (when applicable) are obtained utilizing NASCET criteria, using the distal internal carotid diameter as the denominator. CONTRAST:  1100mL OMNIPAQUE IOHEXOL 350 MG/ML SOLN COMPARISON:  None. Head CT 03/05/2019 FINDINGS: CTA NECK FINDINGS SKELETON: There is no bony spinal canal stenosis. No lytic or blastic lesion. OTHER NECK: Prior left neck dissection with tracheostomy. Status post laryngectomy. UPPER CHEST: No pneumothorax or pleural effusion. No nodules or masses. AORTIC ARCH: There is mild calcific atherosclerosis of the aortic arch. There is no aneurysm, dissection or hemodynamically significant stenosis of the visualized ascending aorta and aortic arch. Conventional 3 vessel aortic branching pattern. The visualized  proximal subclavian arteries are widely patent. RIGHT CAROTID SYSTEM: --Common carotid artery: Widely patent origin without common carotid artery dissection or aneurysm. --Internal carotid artery: No dissection, occlusion or aneurysm. Mild atherosclerotic calcification at the carotid bifurcation without hemodynamically significant stenosis. --External carotid artery: No acute abnormality. LEFT CAROTID SYSTEM: --Common carotid artery: Widely patent origin without common carotid artery dissection or aneurysm. --Internal carotid artery: No dissection, occlusion or aneurysm. Mild atherosclerotic calcification at the carotid bifurcation without hemodynamically significant stenosis. --External carotid artery: No acute abnormality. VERTEBRAL ARTERIES: Left dominant configuration. The left vertebral artery is occluded at its origin  and throughout the V1 segment. There is opacification beginning at the proximal V2 segment there is moderate narrowing of the left vertebral artery lumen at the C5 level (6:116). The left vertebral artery is otherwise normal to the skull base. No right vertebral stenosis. CTA HEAD FINDINGS POSTERIOR CIRCULATION: --Vertebral arteries: There is moderate calcification of the V4 segment of the left vertebral artery without high-grade stenosis. --Posterior inferior cerebellar arteries (PICA): The left PICA is occluded proximally. Right PICA is patent. --Anterior inferior cerebellar arteries (AICA): Not clearly visualized, though this is not uncommon. --Basilar artery: Normal. --Superior cerebellar arteries: Normal. --Posterior cerebral arteries (PCA): The left posterior cerebral artery is occluded at the proximal P2 segment. The right PCA is patent. ANTERIOR CIRCULATION: --Intracranial internal carotid arteries: Normal. --Anterior cerebral arteries (ACA): Normal. Both A1 segments are present. Patent anterior communicating artery (a-comm). --Middle cerebral arteries (MCA): Normal. VENOUS SINUSES: As permitted by contrast timing, patent. ANATOMIC VARIANTS: None DELAYED PHASE: No parenchymal contrast enhancement. Review of the MIP images confirms the above findings. IMPRESSION: 1. 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 2. Left PICA occlusion. 3. Occlusion of the left vertebral artery at its origin and throughout the V1 segment with moderate narrowing of the mid V2 segment. 4. Bilateral carotid bifurcation atherosclerosis with less than 50% stenosis. 5.  Aortic atherosclerosis (ICD10-I70.0). 6. Status post laryngectomy, tracheostomy and left neck dissection. Electronically Signed   By: Ulyses Jarred M.D.   On: 03/05/2019 18:23   Ct Angio Neck W Or Wo Contrast  Result Date: 03/05/2019 CLINICAL DATA:  Right-sided facial droop and weakness EXAM: CT ANGIOGRAPHY HEAD AND NECK TECHNIQUE:  Multidetector CT imaging of the head and neck was performed using the standard protocol during bolus administration of intravenous contrast. Multiplanar CT image reconstructions and MIPs were obtained to evaluate the vascular anatomy. Carotid stenosis measurements (when applicable) are obtained utilizing NASCET criteria, using the distal internal carotid diameter as the denominator. CONTRAST:  173mL OMNIPAQUE IOHEXOL 350 MG/ML SOLN COMPARISON:  None. Head CT 03/05/2019 FINDINGS: CTA NECK FINDINGS SKELETON: There is no bony spinal canal stenosis. No lytic or blastic lesion. OTHER NECK: Prior left neck dissection with tracheostomy. Status post laryngectomy. UPPER CHEST: No pneumothorax or pleural effusion. No nodules or masses. AORTIC ARCH: There is mild calcific atherosclerosis of the aortic arch. There is no aneurysm, dissection or hemodynamically significant stenosis of the visualized ascending aorta and aortic arch. Conventional 3 vessel aortic branching pattern. The visualized proximal subclavian arteries are widely patent. RIGHT CAROTID SYSTEM: --Common carotid artery: Widely patent origin without common carotid artery dissection or aneurysm. --Internal carotid artery: No dissection, occlusion or aneurysm. Mild atherosclerotic calcification at the carotid bifurcation without hemodynamically significant stenosis. --External carotid artery: No acute abnormality. LEFT CAROTID SYSTEM: --Common carotid artery: Widely patent origin without common carotid artery dissection or aneurysm. --Internal carotid artery: No dissection, occlusion or aneurysm. Mild  atherosclerotic calcification at the carotid bifurcation without hemodynamically significant stenosis. --External carotid artery: No acute abnormality. VERTEBRAL ARTERIES: Left dominant configuration. The left vertebral artery is occluded at its origin and throughout the V1 segment. There is opacification beginning at the proximal V2 segment there is moderate narrowing  of the left vertebral artery lumen at the C5 level (6:116). The left vertebral artery is otherwise normal to the skull base. No right vertebral stenosis. CTA HEAD FINDINGS POSTERIOR CIRCULATION: --Vertebral arteries: There is moderate calcification of the V4 segment of the left vertebral artery without high-grade stenosis. --Posterior inferior cerebellar arteries (PICA): The left PICA is occluded proximally. Right PICA is patent. --Anterior inferior cerebellar arteries (AICA): Not clearly visualized, though this is not uncommon. --Basilar artery: Normal. --Superior cerebellar arteries: Normal. --Posterior cerebral arteries (PCA): The left posterior cerebral artery is occluded at the proximal P2 segment. The right PCA is patent. ANTERIOR CIRCULATION: --Intracranial internal carotid arteries: Normal. --Anterior cerebral arteries (ACA): Normal. Both A1 segments are present. Patent anterior communicating artery (a-comm). --Middle cerebral arteries (MCA): Normal. VENOUS SINUSES: As permitted by contrast timing, patent. ANATOMIC VARIANTS: None DELAYED PHASE: No parenchymal contrast enhancement. Review of the MIP images confirms the above findings. IMPRESSION: 1. 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 2. Left PICA occlusion. 3. Occlusion of the left vertebral artery at its origin and throughout the V1 segment with moderate narrowing of the mid V2 segment. 4. Bilateral carotid bifurcation atherosclerosis with less than 50% stenosis. 5.  Aortic atherosclerosis (ICD10-I70.0). 6. Status post laryngectomy, tracheostomy and left neck dissection. Electronically Signed   By: Ulyses Jarred M.D.   On: 03/05/2019 18:23   Ct Head Code Stroke Wo Contrast  Result Date: 03/05/2019 CLINICAL DATA:  Code stroke. Right facial droop. Right-sided weakness. History of throat cancer. History of stroke. EXAM: CT HEAD WITHOUT CONTRAST TECHNIQUE: Contiguous axial images were obtained from the base of the  skull through the vertex without intravenous contrast. COMPARISON:  MRI head 12/09/2017 FINDINGS: Brain: Moderate atrophy. Chronic infarct left occipital lobe. Chronic infarcts in the basal ganglia and white matter bilaterally. Ill-defined hypodensity left medial cerebellum is new since the prior CT and may represent acute infarct. Additional small areas of chronic infarct left cerebellum have developed since the prior MRI. Small chronic infarct in the right cerebellum. Negative for hemorrhage or mass. Vascular: Negative for hyperdense vessel. Atherosclerotic calcification at the skull base Skull: Negative Sinuses/Orbits: Paranasal sinuses clear. Normal orbit. Bilateral cataract surgery Other: None ASPECTS ([place] Stroke Program Early CT Score) - Ganglionic level infarction (caudate, lentiform nuclei, internal capsule, insula, M1-M3 cortex): 7 - Supraganglionic infarction (M4-M6 cortex): 3 Total score (0-10 with 10 being normal): 10 IMPRESSION: 1. Findings suspicious for a small area of acute infarction left medial cerebellum. 2. Extensive chronic ischemic changes as above. Chronic left PCA infarct. 3. ASPECTS is 10 Electronically Signed   By: Franchot Gallo M.D.   On: 03/05/2019 17:52    PHYSICAL EXAM  Temp:  [97.3 F (36.3 C)-98 F (36.7 C)] 97.8 F (36.6 C) (04/10 0900) Pulse Rate:  [29-126] 117 (04/10 1000) Resp:  [16-32] 21 (04/10 1000) BP: (101-165)/(69-116) 129/102 (04/10 1100) SpO2:  [89 %-100 %] 91 % (04/10 1000) Weight:  [104 kg] 104 kg (04/09 1852)  General - Well nourished, well developed, in no apparent distress.  Ophthalmologic - fundi not visualized due to noncooperation.  Cardiovascular - irregularly irregular heart rate and rhythm.  Neuro - awake alert, orientated to place, self,  age and people, but not to month. He is able to communicate with voice box, but paucity of speech but able to name and repeat, no obvious aphasia. Still has chronic right hemianopia, right nasolabial  fold flattening. Tongue midline, no gaze preference, PERRL, EOMI. LUE and BLE no weakness, at least 4+/5. RUE 4/5 with pronator drift and ataxia for FTN. Sensation symmetrical. Gait not tested.    ASSESSMENT/PLAN David [name] is a 85 y.o. 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 Received tPA 03/05/2019 at 1755.  Stroke:  Likely left MCA infarct embolic pattern secondary to known AF not on [facility]  Code Stroke CT head small acute L medial cerebellar stroke. Extensive small vessel disease. Old L PCA infarct. ASPECTS 10.     CTA head & neck occlusion L PCA at P2. L PICA occlusio. L VA occlusion at origin V1. B ICA < 50%. Aortic atherosclerosis. Laryngeal surgical changes w/ trach.  MRI  pending  2D Echo pending  LDL 38  HgbA1c 6.6  SCDs for VTE prophylaxis  No antithrombotic prior to admission, now on No antithromboticas within 24h of tPA. Plan resume eliquis once 24h imaging neg for hemorrhage   Therapy recommendations:  pending   Disposition:  pending   Atrial Fibrillation w/ RVR  Home anticoagulation:  none   Stopped AC d/t hematuria - off as of 12/18/2018 by FP MD. HGB stable over time. Urine only pink color as per pt. Per pt has been pink since eliquis started in 2019  Put on metoprolol low dose for rate control . Recommend continuation of Eliquis (apixaban) daily at discharge . OP urology referral and followup   Hx stroke/TIA  11/2017 - Late acute/early subacute infarction Left occipital lobe and posteromedial temporal lobe punctate areas of infarction in left posterior thalamus and left crus of fornix. Embolic from AF not on AC. Discharged on eliquis. Resultant R HH.   TIA with aphasia for 20 min in 04/2017  Hematuria  Per pt has been pink since  eliquis started in 2019  eliquis stopped in 11/2018  Pt followed with Dr. Diona Fanti and found to have UTI, was put on Abx twice, next appointment 03/11/19  UA this admission neg for RBC, WBC 6-10  H&H stable   Will likely to restart eliquis on discharge  Called Dr. Diona Fanti office and discussed with on call MD, he is ok to restart eliquis given risk and benefit but would like him to close follow up with Dr. Diona Fanti, ideally keep the appointment on 03/11/19  Hypertension  Home meds:  HCTZ 25, ramipril 5  Stable . BP goal per post tPA protocol . On metoprolol for rate control . Long-term BP goal normotensive  Hyperlipidemia  Home meds:  zocor 40, resumed in hospital  LDL 38, goal < 70  Decrease zocor dose to 20  Continue statin at discharge  Diabetes type II Controlled  Home meds:  metformin  HgbA1c 6.6, goal < 7.0  CBGs  SSI  Resume metformin on discharge  Other Stroke Risk Factors  Advanced age  Former Cigarette smoker, quit 27 yrs ago  Overweight, Body mass index is 28.66 kg/m., recommend weight loss, diet and exercise as appropriate   CHF - CXR no acute disease or edema - on diet - d/c IVF  Other Active Problems  Laryngeal cancer  with voice box and trach  Renal insufficiency Cr 1.29->1.20  Hospital day # 1  This patient is critically ill due to left MCA infarct, Afib RVR not on AC, hematuria history and hx of stroke and at significant risk of neurological worsening, death form recurrent stroke, hemorrhagic conversion, heart failure, seizure. This patient's care requires constant monitoring of vital signs, hemodynamics, respiratory and cardiac monitoring, review of multiple databases, neurological assessment, discussion with family, other specialists and medical decision making of high complexity. I spent 40 minutes of neurocritical care time in the care of this patient. I had long discussion with wife over the phone, updated pt current condition,  treatment plan and potential prognosis as well as collected more information about UTI and urology referral. She expressed understanding and appreciation.    David Hawking, MD PhD Stroke Neurology 03/06/2019 11:31 AM   To contact Stroke Continuity provider, please refer to [email or web address] After hours, contact General Neurology

## 2019-03-06 NOTE — Progress Notes (Signed)
OT Cancellation Note  Patient Details Name: David [name] MRN: [id number] DOB: 10-Sep-1932   Cancelled Treatment:    Reason Eval/Treat Not Completed: Active bedrest order; will follow.  Lou Cal, OT Supplemental Rehabilitation Services Pager [phone] Office [phone]   Raymondo Band 03/06/2019, 9:09 AM

## 2019-03-07 DIAGNOSIS — E119 Type 2 diabetes mellitus without complications: Secondary | ICD-10-CM

## 2019-03-07 LAB — CBC
HCT: 41.9 % (ref 39.0–52.0)
Hemoglobin: 13.4 g/dL (ref 13.0–17.0)
MCH: 32.4 pg (ref 26.0–34.0)
MCHC: 32 g/dL (ref 30.0–36.0)
MCV: 101.5 fL — ABNORMAL HIGH (ref 80.0–100.0)
Platelets: 197 10*3/uL (ref 150–400)
RBC: 4.13 MIL/uL — ABNORMAL LOW (ref 4.22–5.81)
RDW: 14.1 % (ref 11.5–15.5)
WBC: 9.6 10*3/uL (ref 4.0–10.5)
nRBC: 0 % (ref 0.0–0.2)

## 2019-03-07 LAB — BASIC METABOLIC PANEL
Anion gap: 15 (ref 5–15)
BUN: 26 mg/dL — ABNORMAL HIGH (ref 8–23)
CO2: 21 mmol/L — ABNORMAL LOW (ref 22–32)
Calcium: 9.7 mg/dL (ref 8.9–10.3)
Chloride: 102 mmol/L (ref 98–111)
Creatinine, Ser: 1.29 mg/dL — ABNORMAL HIGH (ref 0.61–1.24)
GFR calc Af Amer: 58 mL/min — ABNORMAL LOW (ref >60)
GFR calc non Af Amer: 50 mL/min — ABNORMAL LOW (ref >60)
Glucose, Bld: 123 mg/dL — ABNORMAL HIGH (ref 70–99)
Potassium: 4.6 mmol/L (ref 3.5–5.1)
Sodium: 138 mmol/L (ref 135–145)

## 2019-03-07 LAB — GLUCOSE, CAPILLARY
Glucose-Capillary: 147 mg/dL — ABNORMAL HIGH (ref 70–99)
Glucose-Capillary: 134 mg/dL — ABNORMAL HIGH (ref 70–99)

## 2019-03-07 MED ORDER — APIXABAN 5 MG PO TABS
5.0000 mg | ORAL_TABLET | Freq: Two times a day (BID) | ORAL | Status: DC
  Administered 2019-03-07 – 2019-03-12 (×11): 5 mg via ORAL
  Filled 2019-03-07 (×11): qty 1

## 2019-03-07 MED ORDER — QUETIAPINE FUMARATE 25 MG PO TABS
25.0000 mg | ORAL_TABLET | Freq: Every day | ORAL | Status: DC
  Administered 2019-03-07: 25 mg via ORAL
  Filled 2019-03-07: qty 1

## 2019-03-07 MED ORDER — LORAZEPAM 2 MG/ML IJ SOLN
1.0000 mg | Freq: Once | INTRAMUSCULAR | Status: AC
  Administered 2019-03-07: 1 mg via INTRAVENOUS
  Filled 2019-03-07: qty 1

## 2019-03-07 MED ORDER — INSULIN ASPART 100 UNIT/ML ~~LOC~~ SOLN
0.0000 [IU] | Freq: Three times a day (TID) | SUBCUTANEOUS | Status: DC
  Administered 2019-03-08 (×2): 1 [IU] via SUBCUTANEOUS
  Administered 2019-03-09: 2 [IU] via SUBCUTANEOUS
  Administered 2019-03-09 (×2): 1 [IU] via SUBCUTANEOUS
  Administered 2019-03-10 – 2019-03-11 (×3): 2 [IU] via SUBCUTANEOUS
  Administered 2019-03-11: 1 [IU] via SUBCUTANEOUS
  Administered 2019-03-11: 2 [IU] via SUBCUTANEOUS
  Administered 2019-03-12: 1 [IU] via SUBCUTANEOUS
  Administered 2019-03-12: 2 [IU] via SUBCUTANEOUS

## 2019-03-07 NOTE — Progress Notes (Signed)
Late Entry Note for Evaluation completed 4/10 am.  Pt admitted with/for new L medial cerebellar infarct.  Pt is mobilizing at a moderate assist level presently.  Pt currently limited functionally due to the problems listed. ( See problems list.)   Pt will benefit from PT to maximize function and safety in order to get ready for next venue listed below.     03/06/19 1114  PT Visit Information  Last PT Received On 03/06/19  Assistance Needed +1 (+2 for gait progression)  History of Present Illness David [name] is an 85 y.o. 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  CT showing potential infarct L medial cerebellum  Precautions  Precautions Fall  Home Living  Family/patient expects to be discharged to: Private residence  Living Arrangements Spouse/significant other  Available Help at Discharge Available 24 hours/day  Type of [place] to enter  Entrance Stairs-Number of Steps several  Entrance Stairs-Rails Right;Left  Home Layout One level  Company secretary   Lives With Spouse  Prior Function  Level of Independence Independent  Communication  Communication Expressive difficulties (has stoma)  Pain Assessment  Pain Assessment No/denies pain  Cognition  Arousal/Alertness Awake/alert  Behavior During Therapy WFL for tasks assessed/performed  Overall Cognitive Status No family/caregiver present to determine baseline cognitive functioning  General Comments a little impulsive, not fully aware of deficits  Upper Extremity Assessment  Upper Extremity Assessment Defer to OT evaluation  Lower Extremity Assessment  Lower Extremity Assessment RLE deficits/detail;LLE deficits/detail  RLE Deficits /  Details No overt focal weaknesses, hip flexors 2+/3-/5, quad 4-, hams >4, df/pf >3/5  RLE Coordination decreased fine motor;decreased gross motor  LLE Deficits / Details hip flexor weakest at 2+/3_/5, o/w 4/5  L more coordinated than Right, but some incoordination.  Bed Mobility  Overal bed mobility Needs Assistance  Bed Mobility Supine to Sit  Supine to sit Min assist  General bed mobility comments cues for direction, supportive assist to help pt coordinate assist up via R elbow.  Transfers  Overall transfer level Needs assistance  Transfers Sit to/from Stand;Stand Pivot Transfers  Sit to Stand Mod assist  Stand pivot transfers Mod assist  General transfer comment cues for hand placement, coming forward.  assist both forward and up. and stability once up.  Ambulation/Gait  Ambulation/Gait assistance Mod assist  Gait Distance (Feet) 15 Feet (to toilet then additional 40 feet)  Assistive device IV Pole;1 person hand held assist  Gait Pattern/deviations Step-through pattern  General Gait Details unsteady overall with uncoordinated steps, worse R.  Better control with assist from the L UE  Gait velocity interpretation <1.31 ft/sec, indicative of household ambulator  Modified Rankin (Stroke Patients Only)  Pre-Morbid Rankin Score 3  Modified Rankin 4  Balance  Overall balance assessment Needs assistance  Sitting-balance support No upper extremity supported;Bilateral upper extremity supported;Feet supported  Sitting balance-Leahy Scale Fair  Sitting balance - Comments any challenge and pt starts to list posteriorly  Standing balance-Leahy Scale Poor  Standing balance comment needs external support  General Comments  General comments (skin integrity, edema, etc.) HR  120's/130's with gait, otherwise  sats in mid 90's  PT - End of Session  Activity Tolerance Patient tolerated treatment well  Patient left in chair;with call bell/phone  within reach  Nurse Communication Mobility status   PT Assessment  PT Recommendation/Assessment Patient needs continued PT services  PT Visit Diagnosis Unsteadiness on feet (R26.81);Other abnormalities of gait and mobility (R26.89);Muscle weakness (generalized) (M62.81);Other symptoms and signs involving the nervous system (R29.898)  PT Problem List Decreased strength;Decreased activity tolerance;Decreased balance;Decreased mobility;Decreased coordination;Decreased knowledge of use of DME  PT Plan  PT Frequency (ACUTE ONLY) Min 3X/week  PT Treatment/Interventions (ACUTE ONLY) Gait training;DME instruction;Functional mobility training;Therapeutic activities;Balance training;Neuromuscular re-education;Patient/family education  AM-PAC PT "6 Clicks" Mobility Outcome Measure (Version 2)  Help needed turning from your back to your side while in a flat bed without using bedrails? 3  Help needed moving from lying on your back to sitting on the side of a flat bed without using bedrails? 3  Help needed moving to and from a bed to a chair (including a wheelchair)? 2  Help needed standing up from a chair using your arms (e.g., wheelchair or bedside chair)? 2  Help needed to walk in hospital room? 2  Help needed climbing 3-5 steps with a railing?  2  6 Click Score 14  Consider Recommendation of Discharge To: CIR/SNF/LTACH  PT Recommendation  Follow Up Recommendations CIR;Supervision/Assistance - 24 hour  PT equipment None recommended by PT;Other (comment) (TBA)  Individuals Consulted  Consulted and Agree with Results and Recommendations Patient  Acute Rehab PT Goals  Patient Stated Goal pt didn't state  PT Goal Formulation Patient unable to participate in goal setting  Time For Goal Achievement 03/20/19  Potential to Achieve Goals Good  PT Time Calculation  PT Start Time (ACUTE ONLY) 1031  PT Stop Time (ACUTE ONLY) 1108  PT Time Calculation (min) (ACUTE ONLY) 37 min  PT General Charges  $$ ACUTE PT VISIT 1 Visit  PT Evaluation  $PT Eval  Moderate Complexity 1 Mod  PT Treatments  $Gait Training 8-22 mins   03/07/2019  Donnella Sham, PT Acute Rehabilitation Services [phone]  (pager) [phone]  (office)

## 2019-03-07 NOTE — Progress Notes (Signed)
RT called to assess the patient due to desaturation. Upon assessment, the patient was on a 2lpm N/C. The patient has an open stoma. The patient was placed on a 28% ATC with o2 saturation of 97%. Will continue to monitor.

## 2019-03-07 NOTE — Progress Notes (Signed)
PT Cancellation Note  Patient Details Name: David [name] MRN: [id number] DOB: December 22, 1931   Cancelled Treatment:    Reason Eval/Treat Not Completed: Patient declined, no reason specified.  Pt reports not feeling up to it today.  Will try back later as able. 03/07/2019  Donnella Sham, [place] [phone]  (pager) [phone]  (office)   [name] [name] 03/07/2019, 12:27 PM

## 2019-03-07 NOTE — Progress Notes (Signed)
Pt has become agitated trying to get out of bed. Pt is also removing tele and trach colar. Pt de-sats into the 70's when he removes his oxygen. Pt was pushing off RN and NT when we attempted to put oxygen back on. RN and NT managed to get O2 back on pt, and sats are now 96% on 5L. Tele was also put on along with mittens to prevent pt from pulling them off.

## 2019-03-07 NOTE — Discharge Instructions (Signed)

## 2019-03-07 NOTE — Progress Notes (Signed)
Pt has not urinated since arrival on the unit. NT bladder scanned pt and bladder scan volume was 751ml. RN did IN & Out and got 800 ML. Pt's urine was dark, small amounts of blood and malodorous. MD advised that after 3rd In & Out a foley should be put in; if pt continues to be unable to urinate.

## 2019-03-07 NOTE — Progress Notes (Signed)
Physical Therapy Treatment Patient Details Name: David [name] MRN: [id number] DOB: 07-Aug-1932 Today's Date: 03/07/2019    History of Present Illness David [name] is an 85 y.o. 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  CT showing potential infarct L medial cerebellum    PT Comments    On arrival, nursing struggling with saturation reliability.  We sat up to see what pt's response would be.  Emphasis on sitting balance and standing.  Gait aborted when pt showed signs of worsening pushing from left to right.      Follow Up Recommendations  CIR;Supervision/Assistance - 24 hour     Equipment Recommendations  None recommended by PT;Other (comment)    Recommendations for Other Services       Precautions / Restrictions Precautions Precautions: Fall    Mobility  Bed Mobility Overal bed mobility: Needs Assistance Bed Mobility: Sit to Supine     Supine to sit: Mod assist Sit to supine: Max assist;+2 for physical assistance   General bed mobility comments: pt didn't follow to directional cues as well today.  need more assist in general and more 2nd person assist  Transfers Overall transfer level: Needs assistance Equipment used: Rolling walker (2 wheeled);2 person hand held assist Transfers: Sit to/from Stand Sit to Stand: Max assist;+2 physical assistance         General transfer comment: pt started pushing from left to right within session, increasing assistance needs  Ambulation/Gait             General Gait Details: unable to get pt upright enough to safely ambulate   Stairs             Wheelchair Mobility    Modified Rankin (Stroke Patients Only) Modified Rankin (Stroke Patients Only) Pre-Morbid Rankin Score: Moderate  disability Modified Rankin: Severe disability     Balance Overall balance assessment: Needs assistance Sitting-balance support: Single extremity supported;Feet supported Sitting balance-Leahy Scale: Poor Sitting balance - Comments: pt started to push right today which was not present on eval      Standing balance-Leahy Scale: Zero Standing balance comment: needs external support                            Cognition Arousal/Alertness: Awake/alert Behavior During Therapy: WFL for tasks assessed/performed;Impulsive Overall Cognitive Status: (pt presented confused and a little hallucinatory.)                                        Exercises      General Comments General comments (skin integrity, edema, etc.): pt initially having some trouble maintaining sats, but with sensor change and oxygen via collar over stoma, sats were 100%      Pertinent Vitals/Pain Pain Assessment: No/denies pain    Home Living                      Prior Function            PT Goals (current goals can now be found in the care plan section) Acute Rehab PT Goals PT Goal Formulation: Patient unable to participate in goal setting Time For Goal Achievement: 03/20/19 Potential to Achieve Goals:  Good Progress towards PT goals: Not progressing toward goals - comment(new problems arising today)    Frequency    Min 3X/week      PT Plan Current plan remains appropriate    Co-evaluation              AM-PAC PT "6 Clicks" Mobility   Outcome Measure  Help needed turning from your back to your side while in a flat bed without using bedrails?: A Lot Help needed moving from lying on your back to sitting on the side of a flat bed without using bedrails?: A Lot Help needed moving to and from a bed to a chair (including a wheelchair)?: Total Help needed standing up from a chair using your arms (e.g., wheelchair or bedside chair)?: Total Help needed to walk in  hospital room?: Total Help needed climbing 3-5 steps with a railing? : Total 6 Click Score: 8    End of Session Equipment Utilized During Treatment: Oxygen Activity Tolerance: Patient tolerated treatment well Patient left: with call bell/phone within reach;in bed;with bed alarm set;with nursing/sitter in room Nurse Communication: Mobility status PT Visit Diagnosis: Unsteadiness on feet (R26.81);Other abnormalities of gait and mobility (R26.89);Muscle weakness (generalized) (M62.81);Other symptoms and signs involving the nervous system (R29.898)     Time: 8867-7373 PT Time Calculation (min) (ACUTE ONLY): 30 min  Charges:  $Therapeutic Activity: 23-37 mins                     03/07/2019  Donnella Sham, PT Acute Rehabilitation Services [phone]  (pager) [phone]  (office)   David [name] 03/07/2019, 4:10 PM

## 2019-03-07 NOTE — Progress Notes (Signed)
ANTICOAGULATION CONSULT NOTE - Initial Consult  Pharmacy Consult for Eliquis Indication: atrial fibrillation  Allergies  Allergen Reactions  . Actos [Pioglitazone] Other (See Comments)    Hematuria (Blood in the urine) STOPPED BY MD  . Eliquis [Apixaban] Other (See Comments)    Hematuria (Blood in the urine) STOPPED BY MD    Patient Measurements: Height: 6\' 3"  (190.5 cm) Weight: 229 lb 4.5 oz (104 kg) IBW/kg (Calculated) : 84.5  Vital Signs: Temp: 98.2 F (36.8 C) (04/11 0800) Temp Source: Axillary (04/11 0800) BP: 119/73 (04/11 0600) Pulse Rate: 80 (04/11 0600)  Labs: Recent Labs    03/05/19 1738 03/05/19 1745 03/06/19 2052 03/07/19 0450  HGB 12.3*  --  13.9 13.4  HCT 39.4  --  41.0 41.9  PLT 201  --   --  197  APTT 32  --   --   --   LABPROT 15.1  --   --   --   INR 1.2  --   --   --   CREATININE 1.29* 1.20  --  1.29*    Estimated Creatinine Clearance: 54.7 mL/min (A) (by C-G formula based on SCr of 1.29 mg/dL (H)).   Medical History: Past Medical History:  Diagnosis Date  . Cancer of neck ([place])   . History of atrial fibrillation   . Hyperkalemia    with Ramipril dose at 10 mg, K wnl at 5 mg. per day  . Hyperlipidemia   . Hypertension    Pulmonary  . Larynx cancer ([place])   . Right homonymous hemianopsia    [name] 12/09/2017  . Stroke ([place]) 11/2017   peripheral vision loss both eyes/notes 12/09/2017  . Type II diabetes mellitus ([place])   . Urinary retention   . Vertebral fracture, osteoporotic [facility])    Assessment:  85 yr old male to begin Eliquis for atrial fibrillation. Echo 4/10 cannot rule out small mobile thrombus.  Hx hematuria while on Eliquis and discontinued in January.  Admitted 4/9 with stroke, tPA given.  Head CT 4/10 negative for bleed.  Dr. Erlinda Hong discussed with Urology, and to resume Eliquis given risk/benefit. Plan close follow up for hematuria.  Urine noted pink.    Greater than 48 years old but weight is > 60 kg and creatinine in <1.5, so no  dose reduction.  Goal of Therapy:  appropriate Eliquis dose for indication Monitor platelets by anticoagulation protocol: Yes   Plan:   Eliquis 5 mg BID.  Monitor for hematuria.  Arty Baumgartner, [place] Pager: [phone] or phone: [phone] 03/07/2019,11:06 AM

## 2019-03-07 NOTE — Progress Notes (Addendum)
STROKE TEAM PROGRESS NOTE   INTERVAL HISTORY Pt lying in bed, eyes closed but follows all commands. RN is feeding him breakfast. Still has secretions but able to cough out. As per RN, pt did not have good sleep last night. He was given haldol and ativan for MRI yesterday afternoon, he slept well after the medication but overnight still agitated and did not slept well.    Vitals:   03/07/19 0300 03/07/19 0400 03/07/19 0500 03/07/19 0600  BP: 119/72 132/76 (!) 118/92 119/73  Pulse: 79 95 95 80  Resp: 16 18 (!) 26 (!) 23  Temp:  98.7 F (37.1 C)    TempSrc:  Oral    SpO2: 97% 97% 95% 94%  Weight:      Height:        CBC:  Recent Labs  Lab 03/05/19 1738 03/06/19 2052 03/07/19 0450  WBC 6.6  --  9.6  NEUTROABS 4.5  --   --   HGB 12.3* 13.9 13.4  HCT 39.4 41.0 41.9  MCV 103.1*  --  101.5*  PLT 201  --  315    Basic Metabolic Panel:  Recent Labs  Lab 03/05/19 1738 03/05/19 1745 03/06/19 0222 03/06/19 2052 03/07/19 0450  NA 138  --   --  138 138  K 4.0  --   --  4.4 4.6  CL 103  --   --   --  102  CO2 23  --   --   --  21*  GLUCOSE 130*  --   --   --  123*  BUN 24*  --   --   --  26*  CREATININE 1.29* 1.20  --   --  1.29*  CALCIUM 9.7  --   --   --  9.7  MG  --   --  1.9  --   --    Lipid Panel:     Component Value Date/Time   CHOL 92 03/06/2019 0222   TRIG 30 03/06/2019 0222   TRIG 85 12/05/2006 1045   HDL 48 03/06/2019 0222   CHOLHDL 1.9 03/06/2019 0222   VLDL 6 03/06/2019 0222   LDLCALC 38 03/06/2019 0222   HgbA1c:  Lab Results  Component Value Date   HGBA1C 6.6 (H) 03/06/2019   Urine Drug Screen:     Component Value Date/Time   LABOPIA NONE DETECTED 03/06/2019 0004   COCAINSCRNUR NONE DETECTED 03/06/2019 0004   LABBENZ NONE DETECTED 03/06/2019 0004   AMPHETMU NONE DETECTED 03/06/2019 0004   THCU NONE DETECTED 03/06/2019 0004   LABBARB NONE DETECTED 03/06/2019 0004    Alcohol Level     Component Value Date/Time   ETH <10 03/05/2019 1739     IMAGING Ct Angio Head W Or Wo Contrast  Result Date: 03/05/2019 CLINICAL DATA:  Right-sided facial droop and weakness EXAM: CT ANGIOGRAPHY HEAD AND NECK TECHNIQUE: Multidetector CT imaging of the head and neck was performed using the standard protocol during bolus administration of intravenous contrast. Multiplanar CT image reconstructions and MIPs were obtained to evaluate the vascular anatomy. Carotid stenosis measurements (when applicable) are obtained utilizing NASCET criteria, using the distal internal carotid diameter as the denominator. CONTRAST:  159mL OMNIPAQUE IOHEXOL 350 MG/ML SOLN COMPARISON:  None. Head CT 03/05/2019 FINDINGS: CTA NECK FINDINGS SKELETON: There is no bony spinal canal stenosis. No lytic or blastic lesion. OTHER NECK: Prior left neck dissection with tracheostomy. Status post laryngectomy. UPPER CHEST: No pneumothorax or pleural effusion. No nodules or  masses. AORTIC ARCH: There is mild calcific atherosclerosis of the aortic arch. There is no aneurysm, dissection or hemodynamically significant stenosis of the visualized ascending aorta and aortic arch. Conventional 3 vessel aortic branching pattern. The visualized proximal subclavian arteries are widely patent. RIGHT CAROTID SYSTEM: --Common carotid artery: Widely patent origin without common carotid artery dissection or aneurysm. --Internal carotid artery: No dissection, occlusion or aneurysm. Mild atherosclerotic calcification at the carotid bifurcation without hemodynamically significant stenosis. --External carotid artery: No acute abnormality. LEFT CAROTID SYSTEM: --Common carotid artery: Widely patent origin without common carotid artery dissection or aneurysm. --Internal carotid artery: No dissection, occlusion or aneurysm. Mild atherosclerotic calcification at the carotid bifurcation without hemodynamically significant stenosis. --External carotid artery: No acute abnormality. VERTEBRAL ARTERIES: Left dominant  configuration. The left vertebral artery is occluded at its origin and throughout the V1 segment. There is opacification beginning at the proximal V2 segment there is moderate narrowing of the left vertebral artery lumen at the C5 level (6:116). The left vertebral artery is otherwise normal to the skull base. No right vertebral stenosis. CTA HEAD FINDINGS POSTERIOR CIRCULATION: --Vertebral arteries: There is moderate calcification of the V4 segment of the left vertebral artery without high-grade stenosis. --Posterior inferior cerebellar arteries (PICA): The left PICA is occluded proximally. Right PICA is patent. --Anterior inferior cerebellar arteries (AICA): Not clearly visualized, though this is not uncommon. --Basilar artery: Normal. --Superior cerebellar arteries: Normal. --Posterior cerebral arteries (PCA): The left posterior cerebral artery is occluded at the proximal P2 segment. The right PCA is patent. ANTERIOR CIRCULATION: --Intracranial internal carotid arteries: Normal. --Anterior cerebral arteries (ACA): Normal. Both A1 segments are present. Patent anterior communicating artery (a-comm). --Middle cerebral arteries (MCA): Normal. VENOUS SINUSES: As permitted by contrast timing, patent. ANATOMIC VARIANTS: None DELAYED PHASE: No parenchymal contrast enhancement. Review of the MIP images confirms the above findings. IMPRESSION: 1. 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 2. Left PICA occlusion. 3. Occlusion of the left vertebral artery at its origin and throughout the V1 segment with moderate narrowing of the mid V2 segment. 4. Bilateral carotid bifurcation atherosclerosis with less than 50% stenosis. 5.  Aortic atherosclerosis (ICD10-I70.0). 6. Status post laryngectomy, tracheostomy and left neck dissection. Electronically Signed   By: Ulyses Jarred M.D.   On: 03/05/2019 18:23   Dg Chest 1 View  Result Date: 03/06/2019 CLINICAL DATA:  Wheezing. EXAM: CHEST  1 VIEW  COMPARISON:  Chest x-ray from earlier same day and chest x-ray dated 12/04/2018. FINDINGS: Heart size and mediastinal contours are stable. Lungs are clear. No pleural effusion or pneumothorax seen. Osseous structures about the chest are unremarkable. IMPRESSION: No active disease. No evidence of pneumonia or pulmonary edema. Electronically Signed   By: Franki Cabot M.D.   On: 03/06/2019 21:08   Ct Head Wo Contrast  Result Date: 03/06/2019 CLINICAL DATA:  Stroke.  24 hours post tPA EXAM: CT HEAD WITHOUT CONTRAST TECHNIQUE: Contiguous axial images were obtained from the base of the skull through the vertex without intravenous contrast. COMPARISON:  CT head 03/05/2019 FINDINGS: Brain: Negative for acute hemorrhage Hypodensity left medial cerebellum again noted and slightly less prominent. Possible acute infarct. Moderate atrophy. Chronic microvascular ischemic changes throughout the white matter. Chronic infarct in the left occipital lobe. Chronic infarcts in the cerebellum bilaterally. Vascular: Negative for hyperdense vessel Skull: Negative Sinuses/Orbits: Paranasal sinuses clear. Bilateral cataract surgery. Other: None IMPRESSION: 1. Negative for acute hemorrhage 2. Atrophy and extensive chronic ischemic change 3. Persistent hypodensity left medial  cerebellum may represent acute infarct. Recommend MRI Electronically Signed   By: Franchot Gallo M.D.   On: 03/06/2019 18:01   Ct Angio Neck W Or Wo Contrast  Result Date: 03/05/2019 CLINICAL DATA:  Right-sided facial droop and weakness EXAM: CT ANGIOGRAPHY HEAD AND NECK TECHNIQUE: Multidetector CT imaging of the head and neck was performed using the standard protocol during bolus administration of intravenous contrast. Multiplanar CT image reconstructions and MIPs were obtained to evaluate the vascular anatomy. Carotid stenosis measurements (when applicable) are obtained utilizing NASCET criteria, using the distal internal carotid diameter as the denominator.  CONTRAST:  145mL OMNIPAQUE IOHEXOL 350 MG/ML SOLN COMPARISON:  None. Head CT 03/05/2019 FINDINGS: CTA NECK FINDINGS SKELETON: There is no bony spinal canal stenosis. No lytic or blastic lesion. OTHER NECK: Prior left neck dissection with tracheostomy. Status post laryngectomy. UPPER CHEST: No pneumothorax or pleural effusion. No nodules or masses. AORTIC ARCH: There is mild calcific atherosclerosis of the aortic arch. There is no aneurysm, dissection or hemodynamically significant stenosis of the visualized ascending aorta and aortic arch. Conventional 3 vessel aortic branching pattern. The visualized proximal subclavian arteries are widely patent. RIGHT CAROTID SYSTEM: --Common carotid artery: Widely patent origin without common carotid artery dissection or aneurysm. --Internal carotid artery: No dissection, occlusion or aneurysm. Mild atherosclerotic calcification at the carotid bifurcation without hemodynamically significant stenosis. --External carotid artery: No acute abnormality. LEFT CAROTID SYSTEM: --Common carotid artery: Widely patent origin without common carotid artery dissection or aneurysm. --Internal carotid artery: No dissection, occlusion or aneurysm. Mild atherosclerotic calcification at the carotid bifurcation without hemodynamically significant stenosis. --External carotid artery: No acute abnormality. VERTEBRAL ARTERIES: Left dominant configuration. The left vertebral artery is occluded at its origin and throughout the V1 segment. There is opacification beginning at the proximal V2 segment there is moderate narrowing of the left vertebral artery lumen at the C5 level (6:116). The left vertebral artery is otherwise normal to the skull base. No right vertebral stenosis. CTA HEAD FINDINGS POSTERIOR CIRCULATION: --Vertebral arteries: There is moderate calcification of the V4 segment of the left vertebral artery without high-grade stenosis. --Posterior inferior cerebellar arteries (PICA): The left  PICA is occluded proximally. Right PICA is patent. --Anterior inferior cerebellar arteries (AICA): Not clearly visualized, though this is not uncommon. --Basilar artery: Normal. --Superior cerebellar arteries: Normal. --Posterior cerebral arteries (PCA): The left posterior cerebral artery is occluded at the proximal P2 segment. The right PCA is patent. ANTERIOR CIRCULATION: --Intracranial internal carotid arteries: Normal. --Anterior cerebral arteries (ACA): Normal. Both A1 segments are present. Patent anterior communicating artery (a-comm). --Middle cerebral arteries (MCA): Normal. VENOUS SINUSES: As permitted by contrast timing, patent. ANATOMIC VARIANTS: None DELAYED PHASE: No parenchymal contrast enhancement. Review of the MIP images confirms the above findings. IMPRESSION: 1. 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 2. Left PICA occlusion. 3. Occlusion of the left vertebral artery at its origin and throughout the V1 segment with moderate narrowing of the mid V2 segment. 4. Bilateral carotid bifurcation atherosclerosis with less than 50% stenosis. 5.  Aortic atherosclerosis (ICD10-I70.0). 6. Status post laryngectomy, tracheostomy and left neck dissection. Electronically Signed   By: Ulyses Jarred M.D.   On: 03/05/2019 18:23   Mr Brain Wo Contrast  Result Date: 03/06/2019 CLINICAL DATA:  Altered mental status and possible stroke EXAM: MRI HEAD WITHOUT CONTRAST TECHNIQUE: Multiplanar, multiecho pulse sequences of the brain and surrounding structures were obtained without intravenous contrast. COMPARISON:  03/06/2019 head CT FINDINGS: BRAIN: There is multifocal acute  ischemia involving the left thalamus, both temporal lobes and the left cerebellum. The midline structures are normal. There are old infarcts of the left occipital lobe and left cerebellum. Diffuse confluent hyperintense T2-weighted signal within the periventricular, deep and juxtacortical white matter, most  commonly due to chronic ischemic microangiopathy. Generalized atrophy without lobar predilection. No acute hemorrhage. No mass lesion. VASCULAR: The major intracranial arterial and venous sinus flow voids are normal. SKULL AND UPPER CERVICAL SPINE: Calvarial bone marrow signal is normal. There is no skull base mass. Visualized upper cervical spine and soft tissues are normal. SINUSES/ORBITS: No fluid levels or advanced mucosal thickening. No mastoid or middle ear effusion. The orbits are normal. IMPRESSION: 1. Multifocal bilateral posterior circulation acute ischemic infarcts, including the left thalamus, both medial temporal lobes and the left cerebellum. 2. No hemorrhage or mass effect. 3. Chronic ischemic microangiopathy and multiple old infarcts. Electronically Signed   By: Ulyses Jarred M.D.   On: 03/06/2019 18:44   Dg Chest Port 1 View  Result Date: 03/06/2019 CLINICAL DATA:  Altered mental status. History of congestive heart failure. EXAM: PORTABLE CHEST 1 VIEW COMPARISON:  PA and lateral chest 12/04/2018 and 12/09/2017. FINDINGS: There is cardiomegaly without edema. Lung volumes are low with mild subsegmental atelectasis in the bases. No pneumothorax or pleural effusion. Aortic atherosclerosis noted. No acute bony abnormality. IMPRESSION: Cardiomegaly without edema.  No acute disease. Atherosclerosis. Electronically Signed   By: Inge Rise M.D.   On: 03/06/2019 13:29   Ct Head Code Stroke Wo Contrast  Result Date: 03/05/2019 CLINICAL DATA:  Code stroke. Right facial droop. Right-sided weakness. History of throat cancer. History of stroke. EXAM: CT HEAD WITHOUT CONTRAST TECHNIQUE: Contiguous axial images were obtained from the base of the skull through the vertex without intravenous contrast. COMPARISON:  MRI head 12/09/2017 FINDINGS: Brain: Moderate atrophy. Chronic infarct left occipital lobe. Chronic infarcts in the basal ganglia and white matter bilaterally. Ill-defined hypodensity left  medial cerebellum is new since the prior CT and may represent acute infarct. Additional small areas of chronic infarct left cerebellum have developed since the prior MRI. Small chronic infarct in the right cerebellum. Negative for hemorrhage or mass. Vascular: Negative for hyperdense vessel. Atherosclerotic calcification at the skull base Skull: Negative Sinuses/Orbits: Paranasal sinuses clear. Normal orbit. Bilateral cataract surgery Other: None ASPECTS ([place] Stroke Program Early CT Score) - Ganglionic level infarction (caudate, lentiform nuclei, internal capsule, insula, M1-M3 cortex): 7 - Supraganglionic infarction (M4-M6 cortex): 3 Total score (0-10 with 10 being normal): 10 IMPRESSION: 1. Findings suspicious for a small area of acute infarction left medial cerebellum. 2. Extensive chronic ischemic changes as above. Chronic left PCA infarct. 3. ASPECTS is 10 Electronically Signed   By: Franchot Gallo M.D.   On: 03/05/2019 17:52    Transthoracic Echocardiogram 03/06/2019 IMPRESSIONS  1. The left ventricle has mild-moderately reduced systolic function, with an ejection fraction of 40-45%. The cavity size was normal. Left ventricular diastolic Doppler parameters are indeterminate.  2. EF hard to estimate due to rapid irregular rhythm. Inferior basal wall hypokinesis.  3. The right ventricle has normal systolic function. The cavity was normal. There is no increase in right ventricular wall thickness.  4. Left atrial size was severely dilated.  5. Right atrial size was severely dilated.  6. Mild thickening of the mitral valve leaflet.  7. Tricuspid valve regurgitation is severe.  8. The aortic valve is tricuspid. Mild thickening of the aortic valve. Mild calcification of the aortic valve.  9. The  inferior vena cava was dilated in size with <50% respiratory variability. 10. RA is massively dilated TV insertion seems normal doubt Ebsteins. Cannot r/o small mobile thrombus see last image vs shadowing  artifact. 11. The interatrial septum was not well visualized.    PHYSICAL EXAM  Temp:  [97.4 F (36.3 C)-98.7 F (37.1 C)] 98.7 F (37.1 C) (04/11 0400) Pulse Rate:  [45-125] 80 (04/11 0600) Resp:  [15-32] 23 (04/11 0600) BP: (93-150)/(69-117) 119/73 (04/11 0600) SpO2:  [76 %-100 %] 94 % (04/11 0600)  General - Well nourished, well developed, in no apparent distress.  Ophthalmologic - fundi not visualized due to noncooperation.  Cardiovascular - irregularly irregular heart rate and rhythm.  Neuro - eyes closed but follows all simple commands, hypophonia with voice box. Paucity of speech but able to name and repeat, no obvious aphasia. Still has chronic right hemianopia, right nasolabial fold flattening. Tongue midline, no gaze preference, PERRL, EOMI. LUE and BLE no weakness, at least 4+/5. RUE 4/5 with pronator drift and ataxia for FTN. Sensation symmetrical. Gait not tested.    ASSESSMENT/PLAN [name] is a 85 y.o. 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 Received tPA 03/05/2019 at 1755.  Stroke:  Multifocal infarcts including left thalamus, bilateral medial temporal lobes and the left cerebellum, embolic pattern secondary to known AF not on [facility]  Code Stroke CT head small acute L medial cerebellar stroke. Extensive small vessel disease. Old L PCA infarct. ASPECTS 10.     CTA head & neck occlusion L PCA at P2. L PICA occlusion. L VA occlusion at origin V1. B ICA < 50%. Aortic atherosclerosis. Laryngeal surgical changes w/ trach.  MRI - Multifocal bilateral posterior circulation acute ischemic infarcts, including the left thalamus, both medial temporal lobes and the left cerebellum. Multiple old infarcts.   2D Echo - EF 40 - 45%. RA cannot r/o small  mobile thrombus see last image vs shadowing artifact. (currently on Eliquis)  LDL 38  HgbA1c 6.6  SCDs for VTE prophylaxis  No antithrombotic prior to admission, now on eliquis for stroke prevention  Therapy recommendations:  CIR  Disposition:  pending   Atrial Fibrillation w/ RVR  Home anticoagulation:  none   Stopped AC d/t hematuria - off as of 12/18/2018 by FP MD. HGB stable over time. Urine only pink color as per pt. Per pt has been pink since eliquis started in 2019  Put on metoprolol low dose for rate control . Recommend continuation of Eliquis (apixaban) daily at discharge . Urology is OK to restart eliquis at this time   Hx stroke/TIA  11/2017 - Late acute/early subacute infarction Left occipital lobe and posteromedial temporal lobe punctate areas of infarction in left posterior thalamus and left crus of fornix. Embolic from AF not on AC. Discharged on eliquis. Resultant R HH.   TIA with aphasia for 20 min in 04/2017  Hematuria  Per pt has been pink since eliquis started in 2019  eliquis stopped in 11/2018  Pt followed with Dr. Diona Fanti and found to have UTI, was put on Abx twice, next appointment 03/11/19  UA this admission neg for RBC, WBC 6-10  H&H stable   Eliquis restarted after discuss with urology on call MD  Keep the appointment with Dr. Diona Fanti on 03/11/19  Cognitive impairment  Wife stated that pt  does have cognitive impairment at home  Patient overnight agitation with sundowning  Received Haldol and Ativan yesterday for MRI  We will start low-dose Seroquel tonight for sundowning  Hypertension  Home meds: HCTZ 25, ramipril 5  Stable . BP goal < 180 . On metoprolol for rate control and BP . Long-term BP goal normotensive  Hyperlipidemia  Home meds:  zocor 40, resumed in hospital  LDL 38, goal < 70  Decrease zocor dose to 20  Continue statin at discharge  Diabetes type II Controlled  Home meds:  metformin  HgbA1c 6.6, goal <  7.0  CBGs  SSI  Resume metformin on discharge  Other Stroke Risk Factors  Advanced age  Former Cigarette smoker, quit 27 yrs ago  Overweight, Body mass index is 28.66 kg/m., recommend weight loss, diet and exercise as appropriate   CHF - CXR no acute disease or edema - on diet - d/c IVF  Other Active Problems  Laryngeal cancer with voice box and trach  Renal insufficiency Cr 1.29->1.20->1.29   Hospital day # 2  This patient is critically ill due to stroke, Afib RVR not on AC, hematuria history and hx of stroke and at significant risk of neurological worsening, death form recurrent stroke, hemorrhagic conversion, heart failure, seizure. This patient's care requires constant monitoring of vital signs, hemodynamics, respiratory and cardiac monitoring, review of multiple databases, neurological assessment, discussion with family, other specialists and medical decision making of high complexity. I spent 35 minutes of neurocritical care time in the care of this patient. I talked with wife today over the phone, updated pt current condition, treatment plan and potential prognosis as well as rehab needs. She expressed understanding and appreciation. She stated that pt likely needs rehab before going home which I agree.  Rosalin Hawking, MD PhD Stroke Neurology 03/07/2019 3:13 PM     To contact Stroke Continuity provider, please refer to [email or web address] After hours, contact General Neurology

## 2019-03-08 ENCOUNTER — Inpatient Hospital Stay (HOSPITAL_COMMUNITY): Payer: Medicare Other

## 2019-03-08 DIAGNOSIS — R338 Other retention of urine: Secondary | ICD-10-CM

## 2019-03-08 DIAGNOSIS — F0151 Vascular dementia with behavioral disturbance: Secondary | ICD-10-CM

## 2019-03-08 DIAGNOSIS — D72829 Elevated white blood cell count, unspecified: Secondary | ICD-10-CM

## 2019-03-08 DIAGNOSIS — R3121 Asymptomatic microscopic hematuria: Secondary | ICD-10-CM

## 2019-03-08 DIAGNOSIS — N390 Urinary tract infection, site not specified: Secondary | ICD-10-CM

## 2019-03-08 LAB — GLUCOSE, CAPILLARY
Glucose-Capillary: 115 mg/dL — ABNORMAL HIGH (ref 70–99)
Glucose-Capillary: 128 mg/dL — ABNORMAL HIGH (ref 70–99)
Glucose-Capillary: 128 mg/dL — ABNORMAL HIGH (ref 70–99)
Glucose-Capillary: 128 mg/dL — ABNORMAL HIGH (ref 70–99)

## 2019-03-08 LAB — BASIC METABOLIC PANEL
Anion gap: 15 (ref 5–15)
BUN: 31 mg/dL — ABNORMAL HIGH (ref 8–23)
CO2: 22 mmol/L (ref 22–32)
Calcium: 9.4 mg/dL (ref 8.9–10.3)
Chloride: 102 mmol/L (ref 98–111)
Creatinine, Ser: 1.23 mg/dL (ref 0.61–1.24)
GFR calc Af Amer: >60 mL/min (ref >60)
GFR calc non Af Amer: 53 mL/min — ABNORMAL LOW (ref >60)
Glucose, Bld: 130 mg/dL — ABNORMAL HIGH (ref 70–99)
Potassium: 4.7 mmol/L (ref 3.5–5.1)
Sodium: 139 mmol/L (ref 135–145)

## 2019-03-08 LAB — URINALYSIS, ROUTINE W REFLEX MICROSCOPIC
Glucose, UA: NEGATIVE mg/dL
Ketones, ur: 15 mg/dL — AB
Nitrite: NEGATIVE
Protein, ur: NEGATIVE mg/dL
Specific Gravity, Urine: 1.025 (ref 1.005–1.030)
pH: 5 (ref 5.0–8.0)

## 2019-03-08 LAB — CBC
HCT: 40 % (ref 39.0–52.0)
Hemoglobin: 12.8 g/dL — ABNORMAL LOW (ref 13.0–17.0)
MCH: 32.3 pg (ref 26.0–34.0)
MCHC: 32 g/dL (ref 30.0–36.0)
MCV: 101 fL — ABNORMAL HIGH (ref 80.0–100.0)
Platelets: 200 10*3/uL (ref 150–400)
RBC: 3.96 MIL/uL — ABNORMAL LOW (ref 4.22–5.81)
RDW: 14.1 % (ref 11.5–15.5)
WBC: 12.9 10*3/uL — ABNORMAL HIGH (ref 4.0–10.5)
nRBC: 0 % (ref 0.0–0.2)

## 2019-03-08 LAB — URINALYSIS, MICROSCOPIC (REFLEX)

## 2019-03-08 MED ORDER — SODIUM CHLORIDE 0.9 % IV SOLN
INTRAVENOUS | Status: DC
  Administered 2019-03-08 – 2019-03-11 (×3): via INTRAVENOUS

## 2019-03-08 MED ORDER — QUETIAPINE FUMARATE 50 MG PO TABS
50.0000 mg | ORAL_TABLET | Freq: Every day | ORAL | Status: DC
  Administered 2019-03-08 – 2019-03-09 (×2): 50 mg via ORAL
  Filled 2019-03-08 (×2): qty 1

## 2019-03-08 MED ORDER — SODIUM CHLORIDE 0.9 % IV SOLN
1.0000 g | INTRAVENOUS | Status: DC
  Administered 2019-03-08 – 2019-03-09 (×2): 1 g via INTRAVENOUS
  Filled 2019-03-08 (×2): qty 10

## 2019-03-08 NOTE — Progress Notes (Signed)
At 0530 bladder scanned pt and was 92, pt had small stool with small wet area, still drowsy and sleepy, no sing s of respiratory distress noted, continue to monitor

## 2019-03-08 NOTE — Progress Notes (Signed)
STROKE TEAM PROGRESS NOTE   INTERVAL HISTORY Pt lying in bed, drowsy sleepy, needed repetitive stimulation to follow commands.  He was again agitated last night, and received Ativan in addition to the Seroquel.  This morning he was drowsy sleepy, however able to follow commands bilaterally on arousal.  Has urinary retention status post in and out cath, with mild traumatic hematuria, RBC 21-50.  However WBC 11-20 increased from 6-10 2 days ago.  Still has copious secretion from laryngectomy, status post suctioning, CXR no pneumonia or consolidation.   Vitals:   03/08/19 0132 03/08/19 0504 03/08/19 0735 03/08/19 1158  BP:  102/89  (!) 130/109  Pulse:  (!) 109 65 95  Resp:  18 18 17   Temp:  98.7 F (37.1 C)  98.5 F (36.9 C)  TempSrc:  Oral  Axillary  SpO2: 100% 100% 100% 97%  Weight:      Height:        CBC:  Recent Labs  Lab 03/05/19 1738  03/07/19 0450 03/08/19 0455  WBC 6.6  --  9.6 12.9*  NEUTROABS 4.5  --   --   --   HGB 12.3*   < > 13.4 12.8*  HCT 39.4   < > 41.9 40.0  MCV 103.1*  --  101.5* 101.0*  PLT 201  --  197 200   < > = values in this interval not displayed.    Basic Metabolic Panel:  Recent Labs  Lab 03/06/19 0222  03/07/19 0450 03/08/19 0455  NA  --    < > 138 139  K  --    < > 4.6 4.7  CL  --   --  102 102  CO2  --   --  21* 22  GLUCOSE  --   --  123* 130*  BUN  --   --  26* 31*  CREATININE  --   --  1.29* 1.23  CALCIUM  --   --  9.7 9.4  MG 1.9  --   --   --    < > = values in this interval not displayed.   Lipid Panel:     Component Value Date/Time   CHOL 92 03/06/2019 0222   TRIG 30 03/06/2019 0222   TRIG 85 12/05/2006 1045   HDL 48 03/06/2019 0222   CHOLHDL 1.9 03/06/2019 0222   VLDL 6 03/06/2019 0222   LDLCALC 38 03/06/2019 0222   HgbA1c:  Lab Results  Component Value Date   HGBA1C 6.6 (H) 03/06/2019   Urine Drug Screen:     Component Value Date/Time   LABOPIA NONE DETECTED 03/06/2019 0004   COCAINSCRNUR NONE DETECTED  03/06/2019 0004   LABBENZ NONE DETECTED 03/06/2019 0004   AMPHETMU NONE DETECTED 03/06/2019 0004   THCU NONE DETECTED 03/06/2019 0004   LABBARB NONE DETECTED 03/06/2019 0004    Alcohol Level     Component Value Date/Time   ETH <10 03/05/2019 1739    IMAGING Ct Angio Head W Or Wo Contrast  Result Date: 03/05/2019 CLINICAL DATA:  Right-sided facial droop and weakness EXAM: CT ANGIOGRAPHY HEAD AND NECK TECHNIQUE: Multidetector CT imaging of the head and neck was performed using the standard protocol during bolus administration of intravenous contrast. Multiplanar CT image reconstructions and MIPs were obtained to evaluate the vascular anatomy. Carotid stenosis measurements (when applicable) are obtained utilizing NASCET criteria, using the distal internal carotid diameter as the denominator. CONTRAST:  198mL OMNIPAQUE IOHEXOL 350 MG/ML SOLN COMPARISON:  None. Head  CT 03/05/2019 FINDINGS: CTA NECK FINDINGS SKELETON: There is no bony spinal canal stenosis. No lytic or blastic lesion. OTHER NECK: Prior left neck dissection with tracheostomy. Status post laryngectomy. UPPER CHEST: No pneumothorax or pleural effusion. No nodules or masses. AORTIC ARCH: There is mild calcific atherosclerosis of the aortic arch. There is no aneurysm, dissection or hemodynamically significant stenosis of the visualized ascending aorta and aortic arch. Conventional 3 vessel aortic branching pattern. The visualized proximal subclavian arteries are widely patent. RIGHT CAROTID SYSTEM: --Common carotid artery: Widely patent origin without common carotid artery dissection or aneurysm. --Internal carotid artery: No dissection, occlusion or aneurysm. Mild atherosclerotic calcification at the carotid bifurcation without hemodynamically significant stenosis. --External carotid artery: No acute abnormality. LEFT CAROTID SYSTEM: --Common carotid artery: Widely patent origin without common carotid artery dissection or aneurysm. --Internal  carotid artery: No dissection, occlusion or aneurysm. Mild atherosclerotic calcification at the carotid bifurcation without hemodynamically significant stenosis. --External carotid artery: No acute abnormality. VERTEBRAL ARTERIES: Left dominant configuration. The left vertebral artery is occluded at its origin and throughout the V1 segment. There is opacification beginning at the proximal V2 segment there is moderate narrowing of the left vertebral artery lumen at the C5 level (6:116). The left vertebral artery is otherwise normal to the skull base. No right vertebral stenosis. CTA HEAD FINDINGS POSTERIOR CIRCULATION: --Vertebral arteries: There is moderate calcification of the V4 segment of the left vertebral artery without high-grade stenosis. --Posterior inferior cerebellar arteries (PICA): The left PICA is occluded proximally. Right PICA is patent. --Anterior inferior cerebellar arteries (AICA): Not clearly visualized, though this is not uncommon. --Basilar artery: Normal. --Superior cerebellar arteries: Normal. --Posterior cerebral arteries (PCA): The left posterior cerebral artery is occluded at the proximal P2 segment. The right PCA is patent. ANTERIOR CIRCULATION: --Intracranial internal carotid arteries: Normal. --Anterior cerebral arteries (ACA): Normal. Both A1 segments are present. Patent anterior communicating artery (a-comm). --Middle cerebral arteries (MCA): Normal. VENOUS SINUSES: As permitted by contrast timing, patent. ANATOMIC VARIANTS: None DELAYED PHASE: No parenchymal contrast enhancement. Review of the MIP images confirms the above findings. IMPRESSION: 1. 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 2. Left PICA occlusion. 3. Occlusion of the left vertebral artery at its origin and throughout the V1 segment with moderate narrowing of the mid V2 segment. 4. Bilateral carotid bifurcation atherosclerosis with less than 50% stenosis. 5.  Aortic atherosclerosis  (ICD10-I70.0). 6. Status post laryngectomy, tracheostomy and left neck dissection. Electronically Signed   By: Ulyses Jarred M.D.   On: 03/05/2019 18:23   Dg Chest 1 View  Result Date: 03/06/2019 CLINICAL DATA:  Wheezing. EXAM: CHEST  1 VIEW COMPARISON:  Chest x-ray from earlier same day and chest x-ray dated 12/04/2018. FINDINGS: Heart size and mediastinal contours are stable. Lungs are clear. No pleural effusion or pneumothorax seen. Osseous structures about the chest are unremarkable. IMPRESSION: No active disease. No evidence of pneumonia or pulmonary edema. Electronically Signed   By: Franki Cabot M.D.   On: 03/06/2019 21:08   Ct Head Wo Contrast  Result Date: 03/06/2019 CLINICAL DATA:  Stroke.  24 hours post tPA EXAM: CT HEAD WITHOUT CONTRAST TECHNIQUE: Contiguous axial images were obtained from the base of the skull through the vertex without intravenous contrast. COMPARISON:  CT head 03/05/2019 FINDINGS: Brain: Negative for acute hemorrhage Hypodensity left medial cerebellum again noted and slightly less prominent. Possible acute infarct. Moderate atrophy. Chronic microvascular ischemic changes throughout the white matter. Chronic infarct in the left occipital lobe.  Chronic infarcts in the cerebellum bilaterally. Vascular: Negative for hyperdense vessel Skull: Negative Sinuses/Orbits: Paranasal sinuses clear. Bilateral cataract surgery. Other: None IMPRESSION: 1. Negative for acute hemorrhage 2. Atrophy and extensive chronic ischemic change 3. Persistent hypodensity left medial cerebellum may represent acute infarct. Recommend MRI Electronically Signed   By: Franchot Gallo M.D.   On: 03/06/2019 18:01   Ct Angio Neck W Or Wo Contrast  Result Date: 03/05/2019 CLINICAL DATA:  Right-sided facial droop and weakness EXAM: CT ANGIOGRAPHY HEAD AND NECK TECHNIQUE: Multidetector CT imaging of the head and neck was performed using the standard protocol during bolus administration of intravenous contrast.  Multiplanar CT image reconstructions and MIPs were obtained to evaluate the vascular anatomy. Carotid stenosis measurements (when applicable) are obtained utilizing NASCET criteria, using the distal internal carotid diameter as the denominator. CONTRAST:  116mL OMNIPAQUE IOHEXOL 350 MG/ML SOLN COMPARISON:  None. Head CT 03/05/2019 FINDINGS: CTA NECK FINDINGS SKELETON: There is no bony spinal canal stenosis. No lytic or blastic lesion. OTHER NECK: Prior left neck dissection with tracheostomy. Status post laryngectomy. UPPER CHEST: No pneumothorax or pleural effusion. No nodules or masses. AORTIC ARCH: There is mild calcific atherosclerosis of the aortic arch. There is no aneurysm, dissection or hemodynamically significant stenosis of the visualized ascending aorta and aortic arch. Conventional 3 vessel aortic branching pattern. The visualized proximal subclavian arteries are widely patent. RIGHT CAROTID SYSTEM: --Common carotid artery: Widely patent origin without common carotid artery dissection or aneurysm. --Internal carotid artery: No dissection, occlusion or aneurysm. Mild atherosclerotic calcification at the carotid bifurcation without hemodynamically significant stenosis. --External carotid artery: No acute abnormality. LEFT CAROTID SYSTEM: --Common carotid artery: Widely patent origin without common carotid artery dissection or aneurysm. --Internal carotid artery: No dissection, occlusion or aneurysm. Mild atherosclerotic calcification at the carotid bifurcation without hemodynamically significant stenosis. --External carotid artery: No acute abnormality. VERTEBRAL ARTERIES: Left dominant configuration. The left vertebral artery is occluded at its origin and throughout the V1 segment. There is opacification beginning at the proximal V2 segment there is moderate narrowing of the left vertebral artery lumen at the C5 level (6:116). The left vertebral artery is otherwise normal to the skull base. No right  vertebral stenosis. CTA HEAD FINDINGS POSTERIOR CIRCULATION: --Vertebral arteries: There is moderate calcification of the V4 segment of the left vertebral artery without high-grade stenosis. --Posterior inferior cerebellar arteries (PICA): The left PICA is occluded proximally. Right PICA is patent. --Anterior inferior cerebellar arteries (AICA): Not clearly visualized, though this is not uncommon. --Basilar artery: Normal. --Superior cerebellar arteries: Normal. --Posterior cerebral arteries (PCA): The left posterior cerebral artery is occluded at the proximal P2 segment. The right PCA is patent. ANTERIOR CIRCULATION: --Intracranial internal carotid arteries: Normal. --Anterior cerebral arteries (ACA): Normal. Both A1 segments are present. Patent anterior communicating artery (a-comm). --Middle cerebral arteries (MCA): Normal. VENOUS SINUSES: As permitted by contrast timing, patent. ANATOMIC VARIANTS: None DELAYED PHASE: No parenchymal contrast enhancement. Review of the MIP images confirms the above findings. IMPRESSION: 1. 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 2. Left PICA occlusion. 3. Occlusion of the left vertebral artery at its origin and throughout the V1 segment with moderate narrowing of the mid V2 segment. 4. Bilateral carotid bifurcation atherosclerosis with less than 50% stenosis. 5.  Aortic atherosclerosis (ICD10-I70.0). 6. Status post laryngectomy, tracheostomy and left neck dissection. Electronically Signed   By: Ulyses Jarred M.D.   On: 03/05/2019 18:23   Mr Brain Wo Contrast  Result Date: 03/06/2019 CLINICAL  DATA:  Altered mental status and possible stroke EXAM: MRI HEAD WITHOUT CONTRAST TECHNIQUE: Multiplanar, multiecho pulse sequences of the brain and surrounding structures were obtained without intravenous contrast. COMPARISON:  03/06/2019 head CT FINDINGS: BRAIN: There is multifocal acute ischemia involving the left thalamus, both temporal lobes and the  left cerebellum. The midline structures are normal. There are old infarcts of the left occipital lobe and left cerebellum. Diffuse confluent hyperintense T2-weighted signal within the periventricular, deep and juxtacortical white matter, most commonly due to chronic ischemic microangiopathy. Generalized atrophy without lobar predilection. No acute hemorrhage. No mass lesion. VASCULAR: The major intracranial arterial and venous sinus flow voids are normal. SKULL AND UPPER CERVICAL SPINE: Calvarial bone marrow signal is normal. There is no skull base mass. Visualized upper cervical spine and soft tissues are normal. SINUSES/ORBITS: No fluid levels or advanced mucosal thickening. No mastoid or middle ear effusion. The orbits are normal. IMPRESSION: 1. Multifocal bilateral posterior circulation acute ischemic infarcts, including the left thalamus, both medial temporal lobes and the left cerebellum. 2. No hemorrhage or mass effect. 3. Chronic ischemic microangiopathy and multiple old infarcts. Electronically Signed   By: Ulyses Jarred M.D.   On: 03/06/2019 18:44   Dg Chest Port 1 View  Result Date: 03/06/2019 CLINICAL DATA:  Altered mental status. History of congestive heart failure. EXAM: PORTABLE CHEST 1 VIEW COMPARISON:  PA and lateral chest 12/04/2018 and 12/09/2017. FINDINGS: There is cardiomegaly without edema. Lung volumes are low with mild subsegmental atelectasis in the bases. No pneumothorax or pleural effusion. Aortic atherosclerosis noted. No acute bony abnormality. IMPRESSION: Cardiomegaly without edema.  No acute disease. Atherosclerosis. Electronically Signed   By: Inge Rise M.D.   On: 03/06/2019 13:29   Ct Head Code Stroke Wo Contrast  Result Date: 03/05/2019 CLINICAL DATA:  Code stroke. Right facial droop. Right-sided weakness. History of throat cancer. History of stroke. EXAM: CT HEAD WITHOUT CONTRAST TECHNIQUE: Contiguous axial images were obtained from the base of the skull through the  vertex without intravenous contrast. COMPARISON:  MRI head 12/09/2017 FINDINGS: Brain: Moderate atrophy. Chronic infarct left occipital lobe. Chronic infarcts in the basal ganglia and white matter bilaterally. Ill-defined hypodensity left medial cerebellum is new since the prior CT and may represent acute infarct. Additional small areas of chronic infarct left cerebellum have developed since the prior MRI. Small chronic infarct in the right cerebellum. Negative for hemorrhage or mass. Vascular: Negative for hyperdense vessel. Atherosclerotic calcification at the skull base Skull: Negative Sinuses/Orbits: Paranasal sinuses clear. Normal orbit. Bilateral cataract surgery Other: None ASPECTS ([place] Stroke Program Early CT Score) - Ganglionic level infarction (caudate, lentiform nuclei, internal capsule, insula, M1-M3 cortex): 7 - Supraganglionic infarction (M4-M6 cortex): 3 Total score (0-10 with 10 being normal): 10 IMPRESSION: 1. Findings suspicious for a small area of acute infarction left medial cerebellum. 2. Extensive chronic ischemic changes as above. Chronic left PCA infarct. 3. ASPECTS is 10 Electronically Signed   By: Franchot Gallo M.D.   On: 03/05/2019 17:52    Transthoracic Echocardiogram 03/06/2019 IMPRESSIONS  1. The left ventricle has mild-moderately reduced systolic function, with an ejection fraction of 40-45%. The cavity size was normal. Left ventricular diastolic Doppler parameters are indeterminate.  2. EF hard to estimate due to rapid irregular rhythm. Inferior basal wall hypokinesis.  3. The right ventricle has normal systolic function. The cavity was normal. There is no increase in right ventricular wall thickness.  4. Left atrial size was severely dilated.  5. Right atrial size was  severely dilated.  6. Mild thickening of the mitral valve leaflet.  7. Tricuspid valve regurgitation is severe.  8. The aortic valve is tricuspid. Mild thickening of the aortic valve. Mild calcification  of the aortic valve.  9. The inferior vena cava was dilated in size with <50% respiratory variability. 10. RA is massively dilated TV insertion seems normal doubt Ebsteins. Cannot r/o small mobile thrombus see last image vs shadowing artifact. 11. The interatrial septum was not well visualized.    PHYSICAL EXAM  Temp:  [97.6 F (36.4 C)-98.7 F (37.1 C)] 98.5 F (36.9 C) (04/12 1158) Pulse Rate:  [53-109] 95 (04/12 1158) Resp:  [16-27] 17 (04/12 1158) BP: (102-130)/(74-109) 130/109 (04/12 1158) SpO2:  [97 %-100 %] 97 % (04/12 1158) FiO2 (%):  [25 %-28 %] 28 % (04/12 0735)  General - Well nourished, well developed, drowsy and sleepy.  Ophthalmologic - fundi not visualized due to noncooperation.  Cardiovascular - irregularly irregular heart rate and rhythm.  Neuro - eyes closed, drowsy and sleepy, need repetitive stimulation to follow commands.  On arousal, patient able to follow all simple commands bilaterally, hypophonia due to laryngectomy. Paucity of speech but able to mouth his name and answer yes or no, no obvious aphasia. Still has chronic right hemianopia, right nasolabial fold flattening. Tongue midline, no gaze preference, PERRL, EOMI. Moving all extremities on command. Sensation, coordination not corporative and gait not tested.   ASSESSMENT/PLAN David [name] is a 85 y.o. 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 Received tPA 03/05/2019 at 1755.  Stroke:  Multifocal infarcts including left thalamus, bilateral medial temporal lobes and the left cerebellum, embolic pattern secondary to known AF not on [facility]  Code Stroke CT head small acute L medial cerebellar stroke. Extensive small vessel disease. Old L PCA infarct. ASPECTS 10.     CTA head & neck  occlusion L PCA at P2. L PICA occlusion. L VA occlusion at origin V1. B ICA < 50%. Aortic atherosclerosis. Laryngeal surgical changes w/ trach.  MRI - Multifocal bilateral posterior circulation acute ischemic infarcts, including the left thalamus, both medial temporal lobes and the left cerebellum. Multiple old infarcts.   2D Echo - EF 40 - 45%. RA cannot r/o small mobile thrombus see last image vs shadowing artifact. (currently on Eliquis)  LDL 38  HgbA1c 6.6  SCDs for VTE prophylaxis  No antithrombotic prior to admission, now on eliquis for stroke prevention  Therapy recommendations:  CIR  Disposition:  pending   Atrial Fibrillation w/ RVR and frequent PVCs  Home anticoagulation:  none   Stopped AC d/t hematuria - off as of 12/18/2018 by FP MD. HGB stable over time. Urine only pink color as per pt. Per pt has been pink since eliquis started in 2019  Put on metoprolol low dose for rate control  Frequent PVCs on telemetry . Recommend continuation of Eliquis (apixaban) daily at discharge . Urology is OK to restart eliquis at this time   Hx stroke/TIA  11/2017 - Late acute/early subacute infarction Left occipital lobe and posteromedial temporal lobe punctate areas of infarction in left posterior thalamus and left crus of fornix. Embolic from AF not on AC. Discharged on eliquis. Resultant R HH.   TIA with aphasia for 20 min in 04/2017  Hematuria  Per pt has been pink since eliquis  started in 2019  eliquis stopped in 11/2018  Pt followed with Dr. Diona Fanti and found to have UTI, was put on Abx twice, next appointment 03/11/19  UA this admission neg for RBC, WBC 6-10  H&H stable   Eliquis restarted after discuss with urology on call MD  Urinary retention with UTI  Status post in and out cath  Mild traumatic hematuria  UA showed RBC 21-50  UA WBC 11-20, increased from 2 days ago 6-10  Start Rocephin today  Urine culture pending  Copious secretion from laryngectomy   Leukocytosis  History of laryngeal cancer stat post laryngectomy  Copious secretion from laryngectomy site  Suctioning PRN  WBC 9.6-12.9  On Rocephin  CXR 03/08/2019 patchy bibasilar opacities, no consolidation or pneumonia  May consider indomethacin consult if getting worse  Cognitive impairment  Wife stated that pt does have cognitive impairment at home  Patient overnight agitation with sundowning  Received Haldol and Ativan for MRI  Received Ativan and Seroquel last night  Increase Seroquel to 50mg  Qhs for sundowning  Hypertension  Home meds: HCTZ 25, ramipril 5  Stable . On metoprolol for rate control and BP . Long-term BP goal normotensive  Hyperlipidemia  Home meds:  zocor 40, resumed in hospital  LDL 38, goal < 70  Decrease zocor dose to 20  Continue statin at discharge  Diabetes type II Controlled  Home meds:  metformin  HgbA1c 6.6, goal < 7.0  CBGs  SSI  Resume metformin on discharge  Other Stroke Risk Factors  Advanced age  Former Cigarette smoker, quit 27 yrs ago  Overweight, Body mass index is 28.66 kg/m., recommend weight loss, diet and exercise as appropriate   CHF - CXR no acute disease or edema - on diet - d/c IVF  Other Active Problems  Renal insufficiency Cr 1.29->1.20->1.29->1.23   Hospital day # 3  Rosalin Hawking, MD PhD Stroke Neurology 03/08/2019 1:59 PM  I spent  35 minutes in total face-to-face time with the patient, more than 50% of which was spent in counseling and coordination of care, reviewing test results, images and medication, and discussing the diagnosis of stroke, urinary retention, UTI, leukocytosis, secretion from laryngectomy site, cognitive impairment with sundowning, treatment plan and potential prognosis. This patient's care requiresreview of multiple databases, neurological assessment, discussion with family, other specialists and medical decision making of high complexity. I had long discussion with  wife over the phone, updated pt current condition, treatment plan and potential prognosis. She expressed understanding and appreciation.     To contact Stroke Continuity provider, please refer to [email or web address] After hours, contact General Neurology

## 2019-03-08 NOTE — Progress Notes (Addendum)
Around 2000 pt became agitated, trying to get out of bed, taking off trach collar, pulse ox and tele, MD paged and soft restraints on the wrists was given, and a dose of ativan, since then pt has been resting, without distress, 0000 bladder scan him and was 158, will bladder scan him again to check his residual. Wife called around 0430 and was updated about the pt's responses and use of soft restraint on his wrist, stated she will call during the day to check on him

## 2019-03-09 DIAGNOSIS — I639 Cerebral infarction, unspecified: Secondary | ICD-10-CM

## 2019-03-09 LAB — BASIC METABOLIC PANEL
Anion gap: 12 (ref 5–15)
BUN: 37 mg/dL — ABNORMAL HIGH (ref 8–23)
CO2: 24 mmol/L (ref 22–32)
Calcium: 9.2 mg/dL (ref 8.9–10.3)
Chloride: 104 mmol/L (ref 98–111)
Creatinine, Ser: 1.23 mg/dL (ref 0.61–1.24)
GFR calc Af Amer: >60 mL/min (ref >60)
GFR calc non Af Amer: 53 mL/min — ABNORMAL LOW (ref >60)
Glucose, Bld: 126 mg/dL — ABNORMAL HIGH (ref 70–99)
Potassium: 4.7 mmol/L (ref 3.5–5.1)
Sodium: 140 mmol/L (ref 135–145)

## 2019-03-09 LAB — CBC
HCT: 39.3 % (ref 39.0–52.0)
Hemoglobin: 12.5 g/dL — ABNORMAL LOW (ref 13.0–17.0)
MCH: 32.4 pg (ref 26.0–34.0)
MCHC: 31.8 g/dL (ref 30.0–36.0)
MCV: 101.8 fL — ABNORMAL HIGH (ref 80.0–100.0)
Platelets: 209 10*3/uL (ref 150–400)
RBC: 3.86 MIL/uL — ABNORMAL LOW (ref 4.22–5.81)
RDW: 14.5 % (ref 11.5–15.5)
WBC: 10.9 10*3/uL — ABNORMAL HIGH (ref 4.0–10.5)
nRBC: 0 % (ref 0.0–0.2)

## 2019-03-09 LAB — GLUCOSE, CAPILLARY
Glucose-Capillary: 123 mg/dL — ABNORMAL HIGH (ref 70–99)
Glucose-Capillary: 170 mg/dL — ABNORMAL HIGH (ref 70–99)
Glucose-Capillary: 142 mg/dL — ABNORMAL HIGH (ref 70–99)
Glucose-Capillary: 139 mg/dL — ABNORMAL HIGH (ref 70–99)

## 2019-03-09 MED ORDER — ENSURE ENLIVE PO LIQD
237.0000 mL | Freq: Two times a day (BID) | ORAL | Status: DC
  Administered 2019-03-09 – 2019-03-11 (×5): 237 mL via ORAL

## 2019-03-09 NOTE — Progress Notes (Signed)
Inpatient Rehab Admissions:  Inpatient Rehab Consult received.  I met with patient at the bedside and spoke to his wife over the phone for rehabilitation assessment and to discuss goals and expectations of an inpatient rehab admission.  Pt's wife very interested in rehab program and states she will do whatever it takes to get him strong enough to come home with her.  Will follow along for tolerance and insurance authorization.    Signed: Shann Medal, PT, DPT Admissions Coordinator [phone] 03/09/19  1:37 PM

## 2019-03-09 NOTE — Progress Notes (Signed)
Initial Nutrition Assessment   RD working remotely.  DOCUMENTATION CODES:   Not applicable  INTERVENTION:  Provide Ensure Enlive po BID, each supplement provides 350 kcal and 20 grams of protein.  Encourage adequate PO intake.   NUTRITION DIAGNOSIS:   Increased nutrient needs related to chronic illness(CHF) as evidenced by estimated needs.  GOAL:   Patient will meet greater than or equal to 90% of their needs  MONITOR:   PO intake, Supplement acceptance, Labs, Weight trends, I & O's, Skin  REASON FOR ASSESSMENT:   Low Braden    ASSESSMENT:   85 y.o. male with history of laryngeal cancer status post tracheostomy, CHF, hyperlipidemia, hypertension, type 2 diabetes mellitus, prior right PCA stroke presents with R sided weakness. Code Stroke CT head small acute L medial cerebellar stroke.   Per MD, pt lethargic with cognitive impairment. Pt with chronic trach collar due to copious secretions currently on 5 L/min.  RD unable to obtain pt nutrition history. Meal completion has been varied from 20-95%. Pt with no weight loss per weight records. RD to order nutritional supplements to aid in caloric and protein needs.   Unable to complete Nutrition-Focused physical exam at this time.   Labs and medications reviewed.   Diet Order:   Diet Order            Diet heart healthy/carb modified Room service appropriate? No; Fluid consistency: Thin  Diet effective now              EDUCATION NEEDS:   Not appropriate for education at this time  Skin:  Skin Assessment: Reviewed RN Assessment  Last BM:  4/12   Height:   Ht Readings from Last 1 Encounters:  03/05/19 6\' 3"  (1.905 m)    Weight:   Wt Readings from Last 1 Encounters:  03/05/19 104 kg    Ideal Body Weight:  89 kg  BMI:  Body mass index is 28.66 kg/m.  Estimated Nutritional Needs:   Kcal:  3846-6599  Protein:  100-115 grams  Fluid:  1.9 - 2.1 L/day    Corrin Parker, MS, RD, LDN Pager #  [phone] After hours/ weekend pager # [phone]

## 2019-03-09 NOTE — Progress Notes (Signed)
Benefits check for Eliquis submitted.

## 2019-03-09 NOTE — Progress Notes (Signed)
STROKE TEAM PROGRESS NOTE   INTERVAL HISTORY Patient is lying in bed.  He has tracheostomy.  He can be aroused follows simple commands.  He had urinary retention last night requiring Foley catheter insertion.  Vitals:   03/09/19 0722 03/09/19 0736 03/09/19 1107 03/09/19 1142  BP:  102/85  (!) 119/94  Pulse: 80 98 98 (!) 108  Resp: 18 18 18 18   Temp:  98.4 F (36.9 C)  98 F (36.7 C)  TempSrc:  Oral  Axillary  SpO2: 98% 98% 97% 98%  Weight:      Height:        CBC:  Recent Labs  Lab 03/05/19 1738  03/08/19 0455 03/09/19 0520  WBC 6.6   < > 12.9* 10.9*  NEUTROABS 4.5  --   --   --   HGB 12.3*   < > 12.8* 12.5*  HCT 39.4   < > 40.0 39.3  MCV 103.1*   < > 101.0* 101.8*  PLT 201   < > 200 209   < > = values in this interval not displayed.    Basic Metabolic Panel:  Recent Labs  Lab 03/06/19 0222  03/08/19 0455 03/09/19 0520  NA  --    < > 139 140  K  --    < > 4.7 4.7  CL  --    < > 102 104  CO2  --    < > 22 24  GLUCOSE  --    < > 130* 126*  BUN  --    < > 31* 37*  CREATININE  --    < > 1.23 1.23  CALCIUM  --    < > 9.4 9.2  MG 1.9  --   --   --    < > = values in this interval not displayed.   IMAGING No results found.    PHYSICAL EXAM: General - Well nourished, well developed, drowsy and sleepy.  Ophthalmologic - fundi not visualized due to noncooperation.  Cardiovascular - irregularly irregular heart rate and rhythm.  Atrial fibrillation  Neuro - eyes closed, drowsy but can be aroused easily to follow commands.  On arousal, patient able to follow all simple commands bilaterally, hypophonia due to laryngectomy. Paucity of speech but able to mouth his name and answer yes or no, no obvious aphasia. Still has chronic right hemianopia, right nasolabial fold flattening. Tongue midline, no gaze preference, PERRL, EOMI. Moving all extremities on command. Sensation, coordination not corporative and gait not tested.   ASSESSMENT/PLAN [name] is a 85  y.o. 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 Received tPA 03/05/2019 at 1755.  Stroke:  Multifocal infarcts including left thalamus, bilateral medial temporal lobes and the left cerebellum, embolic pattern secondary to known AF not on [facility]  Code Stroke CT head small acute L medial cerebellar stroke. Extensive small vessel disease. Old L PCA infarct. ASPECTS 10.     CTA head & neck occlusion L PCA at P2. L PICA occlusion. L VA occlusion at origin V1. B ICA < 50%. Aortic atherosclerosis. Laryngeal surgical changes w/ trach.  MRI - Multifocal bilateral posterior circulation acute ischemic infarcts, including the left thalamus, both medial temporal lobes and the left cerebellum. Multiple old infarcts.   2D Echo - EF 40 - 45%. RA cannot  r/o small mobile thrombus see last image vs shadowing artifact. (currently on Eliquis)  LDL 38  HgbA1c 6.6  SCDs for VTE prophylaxis  No antithrombotic prior to admission, now on eliquis for stroke prevention  Therapy recommendations:  CIR  Disposition:  pending   Atrial Fibrillation w/ RVR and frequent PVCs  Home anticoagulation:  none   Stopped AC d/t hematuria - off as of 12/18/2018 by FP MD. HGB stable over time. Urine only pink color as per pt. Per pt has been pink since eliquis started in 2019  Put on metoprolol low dose for rate control  Frequent PVCs on telemetry . Recommend continuation of Eliquis (apixaban) daily at discharge . Urology is OK to restart eliquis at this time   Hx stroke/TIA  11/2017 - Late acute/early subacute infarction Left occipital lobe and posteromedial temporal lobe punctate areas of infarction in left posterior thalamus and left crus of fornix. Embolic from AF not on AC. Discharged on eliquis. Resultant  R HH.   TIA with aphasia for 20 min in 04/2017  Hematuria  Per pt has been pink since eliquis started in 2019  eliquis stopped in 11/2018  Pt followed with Dr. Diona Fanti and found to have UTI, was put on Abx twice, next appointment 03/11/19  UA this admission neg for RBC, WBC 6-10  H&H stable   Eliquis restarted after discuss with urology on call MD  Urinary retention with UTI  Status post in and out cath  Mild traumatic hematuria  UA showed RBC 21-50  UA WBC 11-20, increased from 2 days ago 6-10  Start Rocephin 4/12  Urine culture > 100k klebsella oxytoca  Foley reinserted 03/09/2019  Copious secretion from laryngectomy  Leukocytosis  History of laryngeal cancer stat post laryngectomy  Copious secretion from laryngectomy site  Suctioning PRN  WBC 9.6-12.9  On Rocephin 4/12  CXR 03/08/2019 patchy bibasilar opacities, no consolidation or pneumonia  May consider indomethacin consult if getting worse  Cognitive impairment  Wife stated that pt does have cognitive impairment at home  Patient overnight agitation with sundowning  Received Haldol and Ativan for MRI  Received Ativan and Seroquel last night  Increase Seroquel to 50mg  Qhs for sundowning  Hypertension  Home meds: HCTZ 25, ramipril 5  Stable . On metoprolol for rate control and BP . Long-term BP goal normotensive  Hyperlipidemia  Home meds:  zocor 40, resumed in hospital  LDL 38, goal < 70  Decrease zocor dose to 20  Continue statin at discharge  Diabetes type II Controlled  Home meds:  metformin  HgbA1c 6.6, goal < 7.0  CBGs  SSI  Resume metformin on discharge  Other Stroke Risk Factors  Advanced age  Former Cigarette smoker, quit 27 yrs ago  Overweight, Body mass index is 28.66 kg/m., recommend weight loss, diet and exercise as appropriate   CHF - CXR no acute disease or edema - on diet - d/c IVF  Other Active Problems  Renal insufficiency Cr 1.23   Hospital  day # 4  I have personally obtained history,examined this patient, reviewed notes, independently viewed imaging studies, participated in medical decision making and plan of care.ROS completed by me personally and pertinent positives fully documented  I have made any additions or clarifications directly to the above note. Agree with note above.  Continue Eliquis for stroke prevention.  Treatment for UTI.  Continue Foley catheter for now.  Medically stable for transfer to inpatient rehab when bed available.  No family available at  the bedside for discussion.  Greater than 50% time during this 25-minute visit was spent on counseling and coordination of care and discussion with care team.  Antony Contras, MD Medical Director [facility] Pager: [phone] 03/09/2019 3:31 PM   To contact Stroke Continuity provider, please refer to [email or web address] After hours, contact General Neurology

## 2019-03-09 NOTE — Progress Notes (Signed)
Due to urine retention Foley was inserted as per MD order at 0000, return was 416ml, tolerated well

## 2019-03-09 NOTE — Consult Note (Signed)
Physical Medicine and Rehabilitation Consult Reason for Consult: decreased functional mobility Referring Physician: Dr.Xu   HPI: David [name] is a 85 y.o.right handed male with history of diabetes mellitus,laryngeal cancer status post laryngectomy, prior right PCA infarction with right homonymous hemianopsia as well as cognitive impairment, atrial fibrillation not on anticoagulation due to bouts of hematuria. Per chart review patient lives with spouse. Independent with assistive device using a straight point cane. One level home. Presented 03/05/2019 after being found on the floor with right-sided weakness and facial droop. Blood pressure 184/82. CT of the head showed small area of acute infarction left medial cerebellum. CT angiogram of head and neck occlusion of the left posterior cerebral artery at the proximal P2 segment. Left PICA occlusion. Patient did receive TPA. MRI the brain multifocal bilateral posterior circulation acute ischemic infarctions including the left thalamus both medial temporal lobes in the left cerebellum. No hemorrhage or mass effect. Echocardiogram with ejection fraction of 45%. Mild to moderately reduced systolic function.Inferior basal wall hypokinesis. Neurology consulted presently on Eliquis for CVA prophylaxis after being cleared by urology services for noted history of hematuria. Currently maintained on Rocephin for suspected UTI WBC 11-20. Patient remains with chronic tracheostomy tube noted bouts of copious secretions and latest chest x-ray patchy bibasal or opacities solid base and or pneumonia. Patient is currently on a regular consistency diet. Therapy evaluations completed with recommendations of physical medicine rehabilitation consult.   Review of Systems  Unable to perform ROS: Acuity of condition   Past Medical History:  Diagnosis Date  . Cancer of neck ([place])   . History of atrial fibrillation   . Hyperkalemia    with Ramipril dose at 10 mg, K  wnl at 5 mg. per day  . Hyperlipidemia   . Hypertension    Pulmonary  . Larynx cancer ([place])   . Right homonymous hemianopsia    [name] 12/09/2017  . Stroke ([place]) 11/2017   peripheral vision loss both eyes/notes 12/09/2017  . Type II diabetes mellitus ([place])   . Urinary retention   . Vertebral fracture, osteoporotic [facility])    Past Surgical History:  Procedure Laterality Date  . AMPUTATION FINGER / THUMB Right    distal amputation x4 fingers (not thumb)   . CATARACT EXTRACTION W/ INTRAOCULAR LENS  IMPLANT, BILATERAL Bilateral 2007  . DOPPLER ECHOCARDIOGRAPHY     Atrial fib EF 60%--01/18/1994, 12/02/02--atrial fib, EF 50-55  . DOPPLER ECHOCARDIOGRAPHY     ra,la,lv,mild dilat.,trace mitral regurg., mild pulm. htn  . PILONIDAL CYST EXCISION    . RADICAL NECK DISSECTION Left ~ 1993  . TOTAL LARYNGECTOMY  ~1993   with left radical neck dissection   Family History  Problem Relation Age of Onset  . Cirrhosis Father   . Colon cancer Neg Hx   . Prostate cancer Neg Hx    Social History:  reports that he quit smoking about 27 years ago. He has never used smokeless tobacco. He reports that he does not drink alcohol or use drugs. Allergies:  Allergies  Allergen Reactions  . Actos [Pioglitazone] Other (See Comments)    Hematuria (Blood in the urine) STOPPED BY MD  . Eliquis [Apixaban] Other (See Comments)    Hematuria (Blood in the urine) STOPPED BY MD   Medications Prior to Admission  Medication Sig Dispense Refill  . acetaminophen (TYLENOL) 500 MG tablet Take 500 mg by mouth every 6 (six) hours as needed for mild pain or headache.     Marland Kitchen  diphenhydrAMINE (BENADRYL) 25 mg capsule Take 25 mg by mouth every 8 (eight) hours as needed for itching or allergies.     . diphenhydramine-acetaminophen (TYLENOL PM) 25-500 MG TABS tablet Take 1 tablet by mouth at bedtime as needed (for sleep or pain).    . hydrochlorothiazide (HYDRODIURIL) 12.5 MG tablet Take 1 tablet (12.5 mg total) by mouth daily. 90  tablet 3  . metFORMIN (GLUCOPHAGE) 500 MG tablet Take 1 tablet (500 mg total) by mouth 2 (two) times daily with a meal. 180 tablet 3  . oxyCODONE-acetaminophen (PERCOCET) 5-325 MG tablet Take 1 tablet by mouth daily as needed (for back pain).    . ramipril (ALTACE) 5 MG capsule Take 1 capsule (5 mg total) by mouth daily. 90 capsule 3  . simvastatin (ZOCOR) 40 MG tablet Take 1 tablet (40 mg total) by mouth at bedtime. 90 tablet 3  . tamsulosin (FLOMAX) 0.4 MG CAPS capsule Take 1 capsule (0.4 mg total) by mouth daily. (Patient taking differently: Take 0.4 mg by mouth 2 (two) times daily. ) 90 capsule 3    Home: Home Living Family/patient expects to be discharged to:: Private residence Living Arrangements: Spouse/significant other Available Help at Discharge: Available 24 hours/day Type of Home: House Home Access: Stairs to enter [employer] of Steps: several Entrance Stairs-Rails: Right, Left Home Layout: One level Bathroom Shower/Tub: Multimedia programmer: Standard  Lives With: Spouse  Functional History: Prior Function Level of Independence: Independent, Independent with assistive device(s) Comments: reports intermittent use of SPC Functional Status:  Mobility: Bed Mobility Overal bed mobility: Needs Assistance Bed Mobility: Sit to Supine Supine to sit: Mod assist Sit to supine: Max assist, +2 for physical assistance General bed mobility comments: pt didn't follow to directional cues as well today.  need more assist in general and more 2nd person assist Transfers Overall transfer level: Needs assistance Equipment used: Rolling walker (2 wheeled), 2 person hand held assist Transfers: Sit to/from Stand Sit to Stand: Max assist, +2 physical assistance Stand pivot transfers: Mod assist, +2 physical assistance, +2 safety/equipment General transfer comment: pt started pushing from left to right within session, increasing assistance needs Ambulation/Gait  Ambulation/Gait assistance: Mod assist Gait Distance (Feet): 15 Feet(to toilet then additional 40 feet) Assistive device: IV Pole, 1 person hand held assist Gait Pattern/deviations: Step-through pattern General Gait Details: unable to get pt upright enough to safely ambulate Gait velocity interpretation: <1.31 ft/sec, indicative of household ambulator    ADL: ADL Overall ADL's : Needs assistance/impaired Eating/Feeding: Minimal assistance, Sitting Grooming: Minimal assistance, Sitting Upper Body Bathing: Minimal assistance, Sitting Lower Body Bathing: Moderate assistance, +2 for physical assistance, +2 for safety/equipment, Sit to/from stand Upper Body Dressing : Sitting, Minimal assistance Lower Body Dressing: +2 for physical assistance, Maximal assistance, +2 for safety/equipment, Sit to/from stand Lower Body Dressing Details (indicate cue type and reason): modA +2 for standing balance Toilet Transfer: Moderate assistance, +2 for physical assistance, +2 for safety/equipment, Stand-pivot Toilet Transfer Details (indicate cue type and reason): simulated via transfer to EOB from recliner Toileting- Clothing Manipulation and Hygiene: Maximal assistance, +2 for physical assistance, +2 for safety/equipment, Sit to/from stand Functional mobility during ADLs: Moderate assistance, +2 for physical assistance, +2 for safety/equipment(stand pivot transfers) General ADL Comments: pt with impaired cognition, decreased coordination, impaired standing balance and decreased activity tolerance  Cognition: Cognition Overall Cognitive Status: (pt presented confused and a little hallucinatory.) Arousal/Alertness: Awake/alert Orientation Level: Disoriented X4 Attention: Focused, Sustained Focused Attention: Appears intact Sustained Attention: Appears intact Awareness:  Impaired Awareness Impairment: Intellectual impairment Problem Solving: Impaired Problem Solving Impairment: Verbal complex(1/3)  Cognition Arousal/Alertness: Awake/alert Behavior During Therapy: WFL for tasks assessed/performed, Impulsive Overall Cognitive Status: (pt presented confused and a little hallucinatory.) Area of Impairment: Attention, Memory, Following commands, Safety/judgement, Awareness Current Attention Level: Sustained Memory: Decreased short-term memory Following Commands: Follows one step commands inconsistently, Follows one step commands with increased time Safety/Judgement: Decreased awareness of safety, Decreased awareness of deficits Awareness: Intellectual General Comments: pt somewhat impulsive and requires cues for safety, requiring cues/assist from nursing staff prior to start of session to remain seated in recliner as pt attempting to get up on his own; pt with decreased insight into current deficits as he attempts to get up unassisted but needed external assist for safe mobility completion; pt also with difficulty following commands without additional cues  Blood pressure 101/86, pulse (!) 128, temperature 98.4 F (36.9 C), temperature source Oral, resp. rate 20, height 6\' 3"  (1.905 m), weight 104 kg, SpO2 98 %. Physical Exam  Constitutional:  lethargic  HENT:  Head: Normocephalic.  Eyes: Right eye exhibits no discharge. Left eye exhibits no discharge.  Neck: Normal range of motion.  Cardiovascular: Normal rate.  Respiratory: Effort normal.  GI: Soft.  Neurological:  Patient is lethargic. He does follow some demonstrated commands but inconsistently. Hypophonia due to laryngectomy. He only uses some simple words. Poor sitting balance, leaning to right. Able to help correct posture somewhat. Moves left more spontaneously than right.   Psychiatric:  Flat, lethargic    Results for orders placed or performed during the hospital encounter of 03/05/19 (from the past 24 hour(s))  Urinalysis, Routine w reflex microscopic     Status: Abnormal   Collection Time: 03/08/19 11:58 AM  Result  Value Ref Range   Color, Urine AMBER (A) YELLOW   APPearance CLEAR CLEAR   Specific Gravity, Urine 1.025 1.005 - 1.030   pH 5.0 5.0 - 8.0   Glucose, UA NEGATIVE NEGATIVE mg/dL   Hgb urine dipstick LARGE (A) NEGATIVE   Bilirubin Urine SMALL (A) NEGATIVE   Ketones, ur 15 (A) NEGATIVE mg/dL   Protein, ur NEGATIVE NEGATIVE mg/dL   Nitrite NEGATIVE NEGATIVE   Leukocytes,Ua SMALL (A) NEGATIVE  Urinalysis, Microscopic (reflex)     Status: Abnormal   Collection Time: 03/08/19 11:58 AM  Result Value Ref Range   RBC / HPF 21-50 0 - 5 RBC/hpf   WBC, UA 11-20 0 - 5 WBC/hpf   Bacteria, UA MANY (A) NONE SEEN   Squamous Epithelial / LPF 0-5 0 - 5  Glucose, capillary     Status: Abnormal   Collection Time: 03/08/19  2:35 PM  Result Value Ref Range   Glucose-Capillary 128 (H) 70 - 99 mg/dL   Comment 1 Notify RN    Comment 2 Document in Chart   Glucose, capillary     Status: Abnormal   Collection Time: 03/08/19  5:01 PM  Result Value Ref Range   Glucose-Capillary 128 (H) 70 - 99 mg/dL   Comment 1 Notify RN    Comment 2 Document in Chart   Glucose, capillary     Status: Abnormal   Collection Time: 03/08/19  9:33 PM  Result Value Ref Range   Glucose-Capillary 128 (H) 70 - 99 mg/dL   Dg Chest Port 1 View  Result Date: 03/08/2019 CLINICAL DATA:  Shortness of breath. History of stroke, diabetes and laryngeal cancer. EXAM: PORTABLE CHEST 1 VIEW COMPARISON:  Radiographs 03/06/2019. FINDINGS: 1155 hours. Stable cardiomegaly  and aortic atherosclerosis. Patchy left-greater-than-right basilar opacities are slightly increased over the last few days. There is no confluent airspace opacity, edema or large pleural effusion. There is no pneumothorax or acute osseous abnormality. Telemetry leads overlie the chest. IMPRESSION: Mildly increased patchy bibasilar opacities, likely atelectasis. No consolidation or edema. Electronically Signed   By: Richardean Sale M.D.   On: 03/08/2019 12:49     Assessment/Plan:  Diagnosis:  Bilateral posterior circulation infarcts with significant mobility and cognitive deficits 1. Does the need for close, 24 hr/day medical supervision in concert with the patient's rehab needs make it unreasonable for this patient to be served in a less intensive setting? Yes and Potentially 2. Co-Morbidities requiring supervision/potential complications: DM, afib, hyperkalemia 3. Due to bladder management, bowel management, safety, skin/wound care, disease management, medication administration and patient education, does the patient require 24 hr/day rehab nursing? Yes 4. Does the patient require coordinated care of a physician, rehab nurse, PT (1-2 hrs/day, 5 days/week), OT (1-2 hrs/day, 5 days/week) and SLP (1-2 hrs/day, 5 days/week) to address physical and functional deficits in the context of the above medical diagnosis(es)? Yes Addressing deficits in the following areas: balance, endurance, locomotion, strength, transferring, bowel/bladder control, bathing, dressing, feeding, grooming, toileting, cognition, speech, language, swallowing and psychosocial support 5. Can the patient actively participate in an intensive therapy program of at least 3 hrs of therapy per day at least 5 days per week? Yes and Potentially 6. The potential for patient to make measurable gains while on inpatient rehab is good 7. Anticipated functional outcomes upon discharge from inpatient rehab are min assist and mod assist  with PT, min assist and mod assist with OT, supervision and min assist with SLP. 8. Estimated rehab length of stay to reach the above functional goals is: 20-27 days 9. Anticipated D/C setting: Home 10. Anticipated post D/C treatments: [place] therapy 11. Overall Rehab/Functional Prognosis: excellent  RECOMMENDATIONS: This patient's condition is appropriate for continued rehabilitative care in the following setting: CIR Patient has agreed to participate in recommended program. N/A Note that  insurance prior authorization may be required for reimbursement for recommended care.  Comment: Pt with very poor activity tolerance at present. Rehab Admissions Coordinator to follow up.  Thanks,  [name], MD, Mellody Drown  I have personally performed a face to face diagnostic evaluation of this patient. Additionally, I have examined pertinent labs and radiographic images. I have reviewed and concur with the physician assistant's documentation above.    Lavon Paganini Angiulli, PA-C 03/09/2019

## 2019-03-09 NOTE — Progress Notes (Signed)
Physical Therapy Treatment Patient Details Name: David [name] MRN: [id number] DOB: 05/22/1932 Today's Date: 03/09/2019    History of Present Illness David [name] is an 85 y.o. 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  CT showing potential infarct L medial cerebellum    PT Comments    Patient seen for mobility progression. This session focused on bed mobility and sitting balance EOB. Unable to progress to standing this session however pt putting forth good effort. Continue to progress as tolerated and recommend CIR for further skilled PT services to maximize independence and safety with mobility.     Follow Up Recommendations  CIR;Supervision/Assistance - 24 hour     Equipment Recommendations  Other (comment)(TBD next venue)    Recommendations for Other Services       Precautions / Restrictions Precautions Precautions: Fall Restrictions Weight Bearing Restrictions: No    Mobility  Bed Mobility Overal bed mobility: Needs Assistance Bed Mobility: Supine to Sit;Sit to Supine     Supine to sit: Max assist Sit to supine: Max assist;+2 for physical assistance   General bed mobility comments: multimodal cues for sequencing; hand over hand assist to reach for R side bed rail; assist to bring bilat LE/hips to EOB and elevate trunk into sitting  Transfers Overall transfer level: Needs assistance Equipment used: 2 person hand held assist Transfers: Sit to/from Stand Sit to Stand: +2 physical assistance;Max assist         General transfer comment: pt attempting to stand however unable this session with +2 assist; cues and assistance for positioning prior to attempting to stand  Ambulation/Gait                 Stairs              Wheelchair Mobility    Modified Rankin (Stroke Patients Only) Modified Rankin (Stroke Patients Only) Pre-Morbid Rankin Score: Moderate disability Modified Rankin: Severe disability     Balance Overall balance assessment: Needs assistance Sitting-balance support: Single extremity supported;Feet supported;Bilateral upper extremity supported Sitting balance-Leahy Scale: Poor Sitting balance - Comments: worked on sitting balance EOB; pt requires mod A +2 due to restlessness and pushing at times with L UE; assistance required to maintain R UE position on EOB which does aid in achieving midline posture Postural control: Right lateral lean                                  Cognition Arousal/Alertness: Awake/alert Behavior During Therapy: WFL for tasks assessed/performed;Restless Overall Cognitive Status: Impaired/Different from baseline Area of Impairment: Attention;Following commands;Safety/judgement;Awareness                   Current Attention Level: Sustained   Following Commands: Follows one step commands inconsistently;Follows one step commands with increased time Safety/Judgement: Decreased awareness of safety;Decreased awareness of deficits Awareness: Intellectual   General Comments: pt is restless sitting EOB and wants to stand; difficult to assess cognition as pt unable to use voicebox this session      Exercises      General Comments        Pertinent Vitals/Pain Pain Assessment: Faces Faces Pain Scale: No hurt    Home Living  Prior Function            PT Goals (current goals can now be found in the care plan section) Progress towards PT goals: Progressing toward goals    Frequency    Min 3X/week      PT Plan Current plan remains appropriate    Co-evaluation PT/OT/SLP Co-Evaluation/Treatment: Yes Reason for Co-Treatment: Complexity of the patient's impairments (multi-system  involvement);Necessary to address cognition/behavior during functional activity;For patient/therapist safety;To address functional/ADL transfers PT goals addressed during session: Mobility/safety with mobility;Balance        AM-PAC PT "6 Clicks" Mobility   Outcome Measure  Help needed turning from your back to your side while in a flat bed without using bedrails?: A Lot Help needed moving from lying on your back to sitting on the side of a flat bed without using bedrails?: A Lot Help needed moving to and from a bed to a chair (including a wheelchair)?: Total Help needed standing up from a chair using your arms (e.g., wheelchair or bedside chair)?: Total Help needed to walk in hospital room?: Total Help needed climbing 3-5 steps with a railing? : Total 6 Click Score: 8    End of Session Equipment Utilized During Treatment: Oxygen;Gait belt Activity Tolerance: Patient limited by fatigue Patient left: with call bell/phone within reach;in bed;with bed alarm set;with SCD's reapplied;with restraints reapplied;Other (comment)(bilat mittens) Nurse Communication: Mobility status PT Visit Diagnosis: Unsteadiness on feet (R26.81);Other abnormalities of gait and mobility (R26.89);Muscle weakness (generalized) (M62.81);Other symptoms and signs involving the nervous system (R29.898)     Time: 1610-9604 PT Time Calculation (min) (ACUTE ONLY): 26 min  Charges:  $Therapeutic Activity: 8-22 mins                     Earney Navy, PTA Acute Rehabilitation Services Pager: [phone] Office: [phone]     Darliss Cheney 03/09/2019, 11:38 AM

## 2019-03-09 NOTE — Care Management Important Message (Signed)
Important Message  Patient Details  Name: David [name] MRN: [id number] Date of Birth: [birth year]   Medicare Important Message Given:  Yes    [name] [address] 03/09/2019, 4:03 PM

## 2019-03-09 NOTE — Progress Notes (Signed)
Occupational Therapy Treatment Patient Details Name: David [name] MRN: [id number] DOB: [birth year] Today's Date: 03/09/2019    History of present illness KEIGAN TAFOYA is an 85 y.o. 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  CT showing potential infarct L medial cerebellum   OT comments  Patient cooperative, eager to get OOB today.  Session limited to sitting EOB with min-mod assist +2 to complete basic self care tasks.  Min assist for grooming at EOB.  Patient limited throughout session by cognition, attempted use of voice box but pt unable to use this session--pt initially attempting to use at mouth and redirected to trach but unsuccessful.  Restless and reaching for items throughout session, max cueing to redirect.    Follow Up Recommendations  CIR;Supervision/Assistance - 24 hour    Equipment Recommendations  Other (comment)(TBD)    Recommendations for Other Services Rehab consult    Precautions / Restrictions Precautions Precautions: Fall Restrictions Weight Bearing Restrictions: No       Mobility Bed Mobility Overal bed mobility: Needs Assistance Bed Mobility: Supine to Sit;Sit to Supine     Supine to sit: Max assist Sit to supine: Max assist;+2 for physical assistance   General bed mobility comments: multimodal cues for sequencing; hand over hand assist to reach for R side bed rail; assist to bring bilat LE/hips to EOB and elevate trunk into sitting  Transfers Overall transfer level: Needs assistance Equipment used: 2 person hand held assist Transfers: Sit to/from Stand Sit to Stand: +2 physical assistance;Max assist         General transfer comment: pt attempting to stand however unable this session with +2 assist; cues and assistance for  positioning prior to attempting to stand    Balance Overall balance assessment: Needs assistance Sitting-balance support: Single extremity supported;Feet supported;Bilateral upper extremity supported Sitting balance-Leahy Scale: Poor Sitting balance - Comments: worked on sitting balance EOB; pt requires mod A +2 due to restlessness and pushing at times with L UE; assistance required to maintain R UE position on EOB which does aid in achieving midline posture Postural control: Right lateral lean                                 ADL either performed or assessed with clinical judgement   ADL Overall ADL's : Needs assistance/impaired     Grooming: Wash/dry face;Sitting;Min guard Grooming Details (indicate cue type and reason): seated EOB to wash face using L UE with min-mod assist for balance                   Toilet Transfer Details (indicate cue type and reason): unsuccessful attempt to stand         Functional mobility during ADLs: Maximal assistance;+2 for physical assistance;+2 for safety/equipment(limited to EOB only) General ADL Comments: pt continues to be limited by cognition, coordination, balance and act tolerance     Vision   Additional Comments: further assessment needed, difficult to asses due to cognition   Perception     Praxis      Cognition Arousal/Alertness: Awake/alert Behavior During Therapy: [facility] for tasks assessed/performed;Restless Overall Cognitive Status: Impaired/Different from baseline Area of Impairment: Attention;Following commands;Safety/judgement;Awareness  Current Attention Level: Sustained Memory: Decreased short-term memory;Decreased recall of precautions Following Commands: Follows one step commands inconsistently;Follows one step commands with increased time Safety/Judgement: Decreased awareness of safety;Decreased awareness of deficits Awareness: Intellectual   General Comments: restless  throughout session, follows 1 step commands inconsistently; difficult to assess as unable to use voicebox this session         Exercises     Shoulder Instructions       General Comments      Pertinent Vitals/ Pain       Pain Assessment: Faces Faces Pain Scale: No hurt  Home Living                                          Prior Functioning/Environment              Frequency  Min 3X/week        Progress Toward Goals  OT Goals(current goals can now be found in the care plan section)  Progress towards OT goals: Progressing toward goals  Acute Rehab OT Goals Patient Stated Goal: none stated  Plan Discharge plan remains appropriate;Frequency remains appropriate    Co-evaluation    PT/OT/SLP Co-Evaluation/Treatment: Yes Reason for Co-Treatment: Complexity of the patient's impairments (multi-system involvement);Necessary to address cognition/behavior during functional activity;For patient/therapist safety;To address functional/ADL transfers PT goals addressed during session: Mobility/safety with mobility;Balance        AM-PAC OT "6 Clicks" Daily Activity     Outcome Measure   Help from another person eating meals?: A Little Help from another person taking care of personal grooming?: A Little Help from another person toileting, which includes using toliet, bedpan, or urinal?: A Lot Help from another person bathing (including washing, rinsing, drying)?: A Lot Help from another person to put on and taking off regular upper body clothing?: A Lot Help from another person to put on and taking off regular lower body clothing?: Total 6 Click Score: 13    End of Session Equipment Utilized During Treatment: Gait belt  OT Visit Diagnosis: Unsteadiness on feet (R26.81);Other abnormalities of gait and mobility (R26.89)   Activity Tolerance Patient limited by fatigue   Patient Left in bed;with call bell/phone within reach;with bed alarm set;with  restraints reapplied   Nurse Communication Mobility status        Time: 2778-2423 OT Time Calculation (min): 26 min  Charges: OT General Charges $OT Visit: 1 Visit OT Treatments $Self Care/Home Management : 8-22 mins  Delight Stare, [place] Pager [phone] Office [phone]    Delight Stare 03/09/2019, 1:02 PM

## 2019-03-09 NOTE — TOC Benefit Eligibility Note (Signed)
Transition of Care [facility]) Benefit Eligibility Note    Patient Details  Name: David [name] MRN: [id number] Date of Birth: 10/01/1932   Medication/Dose: Arne Cleveland  5 MG BID  Covered?: Yes  Tier: 3 Drug  Prescription Coverage Preferred Pharmacy: [facility])  Spoke with Person/Company/Phone Number:: [employer]EV[employer] # [phone])  Co-Pay: $ 37.00  Prior Approval: No          Memory Argue Phone Number: 03/09/2019, 11:13 AM

## 2019-03-10 LAB — CBC
HCT: 37.8 % — ABNORMAL LOW (ref 39.0–52.0)
Hemoglobin: 12.4 g/dL — ABNORMAL LOW (ref 13.0–17.0)
MCH: 33.7 pg (ref 26.0–34.0)
MCHC: 32.8 g/dL (ref 30.0–36.0)
MCV: 102.7 fL — ABNORMAL HIGH (ref 80.0–100.0)
Platelets: 196 10*3/uL (ref 150–400)
RBC: 3.68 MIL/uL — ABNORMAL LOW (ref 4.22–5.81)
RDW: 14.5 % (ref 11.5–15.5)
WBC: 11.7 10*3/uL — ABNORMAL HIGH (ref 4.0–10.5)
nRBC: 0 % (ref 0.0–0.2)

## 2019-03-10 LAB — GLUCOSE, CAPILLARY
Glucose-Capillary: 107 mg/dL — ABNORMAL HIGH (ref 70–99)
Glucose-Capillary: 168 mg/dL — ABNORMAL HIGH (ref 70–99)
Glucose-Capillary: 154 mg/dL — ABNORMAL HIGH (ref 70–99)
Glucose-Capillary: 113 mg/dL — ABNORMAL HIGH (ref 70–99)

## 2019-03-10 LAB — BASIC METABOLIC PANEL
Anion gap: 11 (ref 5–15)
BUN: 39 mg/dL — ABNORMAL HIGH (ref 8–23)
CO2: 22 mmol/L (ref 22–32)
Calcium: 9 mg/dL (ref 8.9–10.3)
Chloride: 107 mmol/L (ref 98–111)
Creatinine, Ser: 1.29 mg/dL — ABNORMAL HIGH (ref 0.61–1.24)
GFR calc Af Amer: 58 mL/min — ABNORMAL LOW (ref >60)
GFR calc non Af Amer: 50 mL/min — ABNORMAL LOW (ref >60)
Glucose, Bld: 143 mg/dL — ABNORMAL HIGH (ref 70–99)
Potassium: 4 mmol/L (ref 3.5–5.1)
Sodium: 140 mmol/L (ref 135–145)

## 2019-03-10 LAB — URINE CULTURE: Culture: >=100000 — AB

## 2019-03-10 MED ORDER — QUETIAPINE FUMARATE 25 MG PO TABS
25.0000 mg | ORAL_TABLET | Freq: Every day | ORAL | Status: DC
  Administered 2019-03-10 – 2019-03-11 (×2): 25 mg via ORAL
  Filled 2019-03-10 (×2): qty 1

## 2019-03-10 MED ORDER — SODIUM CHLORIDE 0.9 % IV SOLN
1.0000 g | INTRAVENOUS | Status: DC
  Administered 2019-03-10 – 2019-03-12 (×3): 1 g via INTRAVENOUS
  Filled 2019-03-10 (×3): qty 10

## 2019-03-10 NOTE — Progress Notes (Signed)
ANTICOAGULATION CONSULT NOTE - f/u Consult  Pharmacy Consult for Eliquis Indication: atrial fibrillation  Allergies  Allergen Reactions  . Actos [Pioglitazone] Other (See Comments)    Hematuria (Blood in the urine) STOPPED BY MD  . Eliquis [Apixaban] Other (See Comments)    Hematuria (Blood in the urine) STOPPED BY MD    Patient Measurements: Height: 6\' 3"  (190.5 cm) Weight: 229 lb 4.5 oz (104 kg) IBW/kg (Calculated) : 84.5  Vital Signs: Temp: 97.6 F (36.4 C) (04/14 0936) Temp Source: Oral (04/14 0936) BP: 142/71 (04/14 0936) Pulse Rate: 100 (04/14 0936)  Labs: Recent Labs    03/08/19 0455 03/09/19 0520 03/10/19 0442  HGB 12.8* 12.5* 12.4*  HCT 40.0 39.3 37.8*  PLT 200 209 196  CREATININE 1.23 1.23 1.29*    Estimated Creatinine Clearance: 54.7 mL/min (A) (by C-G formula based on SCr of 1.29 mg/dL (H)).   Medical History: Past Medical History:  Diagnosis Date  . Cancer of neck ([place])   . History of atrial fibrillation   . Hyperkalemia    with Ramipril dose at 10 mg, K wnl at 5 mg. per day  . Hyperlipidemia   . Hypertension    Pulmonary  . Larynx cancer ([place])   . Right homonymous hemianopsia    [name] 12/09/2017  . Stroke ([place]) 11/2017   peripheral vision loss both eyes/notes 12/09/2017  . Type II diabetes mellitus ([place])   . Urinary retention   . Vertebral fracture, osteoporotic [facility])    Assessment:  85 yr old male to begin Eliquis for atrial fibrillation. Echo 4/10 cannot rule out small mobile thrombus.  Hx hematuria while on Eliquis and discontinued in January.  Admitted 4/9 with stroke, tPA given.  Head CT 4/10 negative for bleed.  Dr. Erlinda Hong discussed with Urology, and to resume Eliquis given risk/benefit.     Greater than 100 years old but weight is > 60 kg and creatinine in <1.5, so no dose reduction. Hgb 12.4 stable  Goal of Therapy:  appropriate Eliquis dose for indication Monitor platelets by anticoagulation protocol: Yes   Plan:   Eliquis 5 mg  BID.  Monitor for hematuria.  Norene Oliveri A. Levada Dy, PharmD, [place] Please utilize Amion for appropriate phone number to reach the unit pharmacist ([place])   03/10/2019,10:55 AM

## 2019-03-10 NOTE — Progress Notes (Signed)
STROKE TEAM PROGRESS NOTE   INTERVAL HISTORY Patient is sitting up in bed.  Continues to have tracheostomy secretions.  He is working with the therapists Vitals:   03/10/19 0936 03/10/19 1115 03/10/19 1203 03/10/19 1238  BP: (!) 142/71  131/74   Pulse: 100 78 85   Resp: 18 16 20    Temp: 97.6 F (36.4 C)  97.6 F (36.4 C)   TempSrc: Oral  Oral   SpO2: 100% 100% (!) 89% 98%  Weight:      Height:        CBC:  Recent Labs  Lab 03/05/19 1738  03/09/19 0520 03/10/19 0442  WBC 6.6   < > 10.9* 11.7*  NEUTROABS 4.5  --   --   --   HGB 12.3*   < > 12.5* 12.4*  HCT 39.4   < > 39.3 37.8*  MCV 103.1*   < > 101.8* 102.7*  PLT 201   < > 209 196   < > = values in this interval not displayed.    Basic Metabolic Panel:  Recent Labs  Lab 03/06/19 0222  03/09/19 0520 03/10/19 0442  NA  --    < > 140 140  K  --    < > 4.7 4.0  CL  --    < > 104 107  CO2  --    < > 24 22  GLUCOSE  --    < > 126* 143*  BUN  --    < > 37* 39*  CREATININE  --    < > 1.23 1.29*  CALCIUM  --    < > 9.2 9.0  MG 1.9  --   --   --    < > = values in this interval not displayed.   IMAGING No results found.    PHYSICAL EXAM:: General - Well nourished, well developed, drowsy and sleepy.  Ophthalmologic - fundi not visualized due to noncooperation.  Cardiovascular - irregularly irregular heart rate and rhythm.  Atrial fibrillation  Neuro - eyes closed, drowsy but can be aroused easily to follow commands.  On arousal, patient able to follow all simple commands bilaterally, hypophonia due to laryngectomy. Paucity of speech but able to mouth his name and answer yes or no, no obvious aphasia. Still has chronic right hemianopia, right nasolabial fold flattening. Tongue midline, no gaze preference, PERRL, EOMI. Moving all extremities on command. Sensation, coordination not corporative and gait not tested.   ASSESSMENT/PLAN Mr. TIGE MEAS is a 85 y.o. male with history of laryngeal cancer status post  tracheostomy, hyperlipidemia, hypertension, type 2 diabetes mellitus, prior right PCA stroke with right homonymous hemianopsia, atrial fibrillation not on anticoagulation found down, presenting with R sided weakness. Received tPA 03/05/2019 at 1755.  Stroke:  Multifocal infarcts including left thalamus, bilateral medial temporal lobes and the left cerebellum, embolic pattern secondary to known AF not on [facility]  Code Stroke CT head small acute L medial cerebellar stroke. Extensive small vessel disease. Old L PCA infarct. ASPECTS 10.     CTA head & neck occlusion L PCA at P2. L PICA occlusion. L VA occlusion at origin V1. B ICA < 50%. Aortic atherosclerosis. Laryngeal surgical changes w/ trach.  MRI - Multifocal bilateral posterior circulation acute ischemic infarcts, including the left thalamus, both medial temporal lobes and the left cerebellum. Multiple old infarcts.   2D Echo - EF 40 - 45%. RA cannot r/o small mobile thrombus see last image vs shadowing artifact. (  currently on Eliquis)  LDL 38  HgbA1c 6.6  SCDs for VTE prophylaxis  No antithrombotic prior to admission, now on eliquis for stroke prevention  Therapy recommendations:  CIR  Disposition:  pending   Atrial Fibrillation w/ RVR and frequent PVCs  Home anticoagulation:  none   Stopped AC d/t hematuria - off as of 12/18/2018 by FP MD. HGB stable over time. Urine only pink color as per pt. Per pt has been pink since eliquis started in 2019  Put on metoprolol low dose for rate control  Frequent PVCs on telemetry . Recommend continuation of Eliquis (apixaban) daily at discharge . Urology is OK to restart eliquis at this time   Hx stroke/TIA  11/2017 - Late acute/early subacute infarction Left occipital lobe and posteromedial temporal lobe punctate areas of infarction in left posterior thalamus and left crus of fornix. Embolic from AF not on AC. Discharged on eliquis. Resultant R HH.   TIA with aphasia for 20 min in  04/2017  Hematuria  Per pt has been pink since eliquis started in 2019  eliquis stopped in 11/2018  Pt followed with Dr. Diona Fanti and found to have UTI, was put on Abx twice, next appointment 03/11/19  UA this admission neg for RBC, WBC 6-10  H&H stable   Eliquis restarted after discuss with urology on call MD  Urinary retention with UTI  Status post in and out cath  Mild traumatic hematuria  UA showed RBC 21-50  UA WBC 11-20, increased from 2 days ago 6-10  Start Rocephin 4/12 (7D)  Urine culture > 100k klebsella oxytoca. Sensitive to ceftriaxone   Foley reinserted 03/09/2019  Copious secretion from laryngectomy  Leukocytosis  History of laryngeal cancer stat post laryngectomy  Copious secretion from laryngectomy site  Suctioning PRN  WBC 11.7  On Rocephin 4/12, plan 7 days  CXR 03/08/2019 patchy bibasilar opacities, no consolidation or pneumonia  May consider indomethacin consult if getting worse  Cognitive impairment  Wife stated that pt does have cognitive impairment at home  Patient overnight agitation with sundowning  Received Haldol and Ativan for MRI  Received Ativan and Seroquel last night  Increase Seroquel to 50mg  Qhs for sundowning  Hypertension  Home meds: HCTZ 25, ramipril 5  Stable . On metoprolol for rate control and BP . Long-term BP goal normotensive  Hyperlipidemia  Home meds:  zocor 40, resumed in hospital  LDL 38, goal < 70  Decrease zocor dose to 20  Continue statin at discharge  Diabetes type II Controlled  Home meds:  metformin  HgbA1c 6.6, goal < 7.0  CBGs  SSI  Resume metformin on discharge  Other Stroke Risk Factors  Advanced age  Former Cigarette smoker, quit 27 yrs ago  Overweight, Body mass index is 28.66 kg/m., recommend weight loss, diet and exercise as appropriate   CHF - CXR no acute disease or edema - on diet - d/c IVF  Other Active Problems  Renal insufficiency Cr 1.29  Lethargy.  ? R/t med effect, UTI, PNA. Decreased seroquel dose from 50 to 25 mg hs. Continue abx.    Elevated MCV 102.7. check VB12 and folate.   Hospital day # 5  Continue antibiotics for UTI.  Rehab team is following his progress to look for more participation prior to transfer to inpatient rehab.  Plan reduce Seroquel dose to 25 mg at night due to excessive daytime sleepiness.  Antony Contras, MD Medical Director [facility] Pager: [phone] 03/10/2019 2:52 PM  To contact Stroke Continuity provider, please refer to [email or web address] After hours, contact General Neurology

## 2019-03-10 NOTE — Progress Notes (Signed)
Upon assessment this AM the left forearm peripheral IV site appears red and edematous and warm to touch. Pt unable to state if it is painful. IV discontinued without further complication, and IV tubing disposed of. Dr. Leonie Man notified, will continue to monitor.

## 2019-03-10 NOTE — Progress Notes (Signed)
Physical Therapy Treatment Patient Details Name: David [name] MRN: [id number] DOB: 01-29-1932 Today's Date: 03/10/2019    History of Present Illness David [name] is an 85 y.o. 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  CT showing potential infarct L medial cerebellum    PT Comments    Patient seen for mobility progression. Pt is lethargic and not as communicative this session requiring increased assistance for bed mobility and sitting balance EOB. Not sure if this could be from medication. Continue to progress as tolerated.     Follow Up Recommendations  CIR;Supervision/Assistance - 24 hour     Equipment Recommendations  Other (comment)(TBD next venue)    Recommendations for Other Services       Precautions / Restrictions Precautions Precautions: Fall    Mobility  Bed Mobility Overal bed mobility: Needs Assistance Bed Mobility: Supine to Sit;Sit to Supine     Supine to sit: +2 for physical assistance;Total assist Sit to supine: +2 for physical assistance;Total assist   General bed mobility comments: multimodal cues for sequencing and hand over hand assist for using bed rail; assist for all aspects of bed mobility   Transfers                    Ambulation/Gait                 Stairs             Wheelchair Mobility    Modified Rankin (Stroke Patients Only) Modified Rankin (Stroke Patients Only) Pre-Morbid Rankin Score: Moderate disability Modified Rankin: Severe disability     Balance Overall balance assessment: Needs assistance Sitting-balance support: Single extremity supported;Feet supported;Bilateral upper extremity supported Sitting balance-Leahy Scale: Zero Sitting balance - Comments: pt with heavy R lateral  lean and assisting minimally with sitting balance EOB; multimodal cues required  Postural control: Right lateral lean                                  Cognition Arousal/Alertness: Lethargic Behavior During Therapy: WFL for tasks assessed/performed;Restless Overall Cognitive Status: Impaired/Different from baseline Area of Impairment: Attention;Following commands;Awareness                   Current Attention Level: Sustained   Following Commands: Follows one step commands inconsistently;Follows one step commands with increased time Safety/Judgement: Decreased awareness of safety;Decreased awareness of deficits Awareness: Intellectual   General Comments: pt was drowsy during session and not following cues as well this session; pt answer questions with head nods ~25% of the time       Exercises      General Comments General comments (skin integrity, edema, etc.): when pt is lying flat for repositioning HR noted to elevate up to 120 bpm       Pertinent Vitals/Pain Pain Assessment: Faces Faces Pain Scale: Hurts a little bit Pain Location: unspecified (grimacing with bed mobility) Pain Descriptors / Indicators: Grimacing Pain Intervention(s): Limited activity within patient's tolerance;Monitored during session;Repositioned    Home Living                      Prior Function            PT Goals (current goals can now  be found in the care plan section) Progress towards PT goals: Progressing toward goals    Frequency    Min 3X/week      PT Plan Current plan remains appropriate    Co-evaluation              AM-PAC PT "6 Clicks" Mobility   Outcome Measure  Help needed turning from your back to your side while in a flat bed without using bedrails?: Total Help needed moving from lying on your back to sitting on the side of a flat bed without using bedrails?: Total Help needed moving to and from a bed to a chair (including a  wheelchair)?: Total Help needed standing up from a chair using your arms (e.g., wheelchair or bedside chair)?: Total Help needed to walk in hospital room?: Total Help needed climbing 3-5 steps with a railing? : Total 6 Click Score: 6    End of Session Equipment Utilized During Treatment: Oxygen Activity Tolerance: Patient limited by fatigue Patient left: with call bell/phone within reach;in bed;with bed alarm set;with SCD's reapplied;with restraints reapplied(bilat mittens) Nurse Communication: Mobility status PT Visit Diagnosis: Unsteadiness on feet (R26.81);Other abnormalities of gait and mobility (R26.89);Muscle weakness (generalized) (M62.81);Other symptoms and signs involving the nervous system (R29.898)     Time: 3568-6168 PT Time Calculation (min) (ACUTE ONLY): 22 min  Charges:  $Therapeutic Activity: 8-22 mins                     Earney Navy, PTA Acute Rehabilitation Services Pager: [phone] Office: [phone]     Darliss Cheney 03/10/2019, 12:15 PM

## 2019-03-11 LAB — BASIC METABOLIC PANEL
Anion gap: 11 (ref 5–15)
BUN: 39 mg/dL — ABNORMAL HIGH (ref 8–23)
CO2: 22 mmol/L (ref 22–32)
Calcium: 8.9 mg/dL (ref 8.9–10.3)
Chloride: 109 mmol/L (ref 98–111)
Creatinine, Ser: 1.19 mg/dL (ref 0.61–1.24)
GFR calc Af Amer: >60 mL/min (ref >60)
GFR calc non Af Amer: 55 mL/min — ABNORMAL LOW (ref >60)
Glucose, Bld: 132 mg/dL — ABNORMAL HIGH (ref 70–99)
Potassium: 4.3 mmol/L (ref 3.5–5.1)
Sodium: 142 mmol/L (ref 135–145)

## 2019-03-11 LAB — CBC
HCT: 39.8 % (ref 39.0–52.0)
Hemoglobin: 12.5 g/dL — ABNORMAL LOW (ref 13.0–17.0)
MCH: 32.1 pg (ref 26.0–34.0)
MCHC: 31.4 g/dL (ref 30.0–36.0)
MCV: 102.1 fL — ABNORMAL HIGH (ref 80.0–100.0)
Platelets: 179 10*3/uL (ref 150–400)
RBC: 3.9 MIL/uL — ABNORMAL LOW (ref 4.22–5.81)
RDW: 14.8 % (ref 11.5–15.5)
WBC: 10.2 10*3/uL (ref 4.0–10.5)
nRBC: 0 % (ref 0.0–0.2)

## 2019-03-11 LAB — GLUCOSE, CAPILLARY
Glucose-Capillary: 128 mg/dL — ABNORMAL HIGH (ref 70–99)
Glucose-Capillary: 191 mg/dL — ABNORMAL HIGH (ref 70–99)
Glucose-Capillary: 162 mg/dL — ABNORMAL HIGH (ref 70–99)
Glucose-Capillary: 142 mg/dL — ABNORMAL HIGH (ref 70–99)

## 2019-03-11 LAB — VITAMIN B12: Vitamin B-12: 1386 pg/mL — ABNORMAL HIGH (ref 180–914)

## 2019-03-11 NOTE — Progress Notes (Signed)
Occupational Therapy Treatment Patient Details Name: David [name] MRN: [id number] DOB: January 30, 1932 Today's Date: 03/11/2019    History of present illness David [name] is an 85 y.o. 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  CT showing potential infarct L medial cerebellum   OT comments  Pt supine in bed and agreeable to PT/OT session.  Patient requires +2 for bed mobility total assist, sitting EOB engaged in grooming tasks with min assist (cueing for sequencing, initation and problem solving, with perseveration on washing face).  Patient able to follow 1 step commands approximately 50% of the time, cueing to attend to R side (physical assist to turn head), and attempts to verbalize throughout session.  Decreased impulsivity today. Fatigues easily. Will follow.    Follow Up Recommendations  CIR;Supervision/Assistance - 24 hour    Equipment Recommendations  Other (comment)(TBd at next venue of care)    Recommendations for Other Services Rehab consult    Precautions / Restrictions Precautions Precautions: Fall Restrictions Weight Bearing Restrictions: No       Mobility Bed Mobility Overal bed mobility: Needs Assistance Bed Mobility: Supine to Sit;Sit to Supine     Supine to sit: +2 for physical assistance;Total assist Sit to supine: +2 for physical assistance;Total assist   General bed mobility comments: multimodal cues for sequencing and hand over hand assist for using bed rail; assist for all aspects of bed mobility   Transfers                 General transfer comment: deferred due to safety/fatigue     Balance Overall balance assessment: Needs assistance Sitting-balance support: Single extremity supported;Feet supported;Bilateral upper  extremity supported Sitting balance-Leahy Scale: Poor Sitting balance - Comments: sitting EOB with mod-minA (at best) while engaging in functional ADLs using L UE  Postural control: Right lateral lean                                 ADL either performed or assessed with clinical judgement   ADL Overall ADL's : Needs assistance/impaired     Grooming: Wash/dry face;Sitting;Minimal assistance;Oral care Grooming Details (indicate cue type and reason): oral care with min assist using suction swab, requires assist to initate washing face but able to complete task, perseverates on task                    Toilet Transfer Details (indicate cue type and reason): deferred due to safety/fatigue         Functional mobility during ADLs: +2 for physical assistance;+2 for safety/equipment;Total assistance(limited to EOB)       Vision   Additional Comments: further assessment required, requires physical support to turn head to R side today    Perception     Praxis      Cognition Arousal/Alertness: Lethargic Behavior During Therapy: [facility] for tasks assessed/performed;Restless Overall Cognitive Status: Impaired/Different from baseline Area of Impairment: Attention;Following commands;Awareness;Problem solving                   Current Attention Level: Sustained Memory: Decreased short-term memory;Decreased recall of precautions Following Commands: Follows one step commands inconsistently;Follows one step commands with increased time Safety/Judgement: Decreased awareness of safety;Decreased awareness of deficits Awareness: Intellectual Problem Solving: Slow processing;Requires verbal  cues;Requires tactile cues;Decreased initiation;Difficulty sequencing General Comments: pt following approx 50% commands, require cueing to attend to R side of body and visual field; tries to mouth word and nod head during session; decreased impulsivity today;difficult to assess due to  trach         Exercises     Shoulder Instructions       General Comments suctioned prior to session, HR flucutates up to 130s when supine     Pertinent Vitals/ Pain       Pain Assessment: Faces Faces Pain Scale: Hurts a little bit Pain Location: unspecified (grimacing with mobility) Pain Descriptors / Indicators: Grimacing Pain Intervention(s): Limited activity within patient's tolerance;Repositioned;Monitored during session  Home Living                                          Prior Functioning/Environment              Frequency  Min 3X/week        Progress Toward Goals  OT Goals(current goals can now be found in the care plan section)  Progress towards OT goals: Progressing toward goals  Acute Rehab OT Goals Patient Stated Goal: none stated  Plan Discharge plan remains appropriate;Frequency remains appropriate    Co-evaluation    PT/OT/SLP Co-Evaluation/Treatment: Yes Reason for Co-Treatment: Complexity of the patient's impairments (multi-system involvement);Necessary to address cognition/behavior during functional activity;For patient/therapist safety;To address functional/ADL transfers   OT goals addressed during session: ADL's and self-care      AM-PAC OT "6 Clicks" Daily Activity     Outcome Measure   Help from another person eating meals?: A Little Help from another person taking care of personal grooming?: A Little Help from another person toileting, which includes using toliet, bedpan, or urinal?: A Lot Help from another person bathing (including washing, rinsing, drying)?: A Lot Help from another person to put on and taking off regular upper body clothing?: A Lot Help from another person to put on and taking off regular lower body clothing?: Total 6 Click Score: 13    End of Session    OT Visit Diagnosis: Unsteadiness on feet (R26.81);Other abnormalities of gait and mobility (R26.89)   Activity Tolerance Patient limited  by fatigue   Patient Left in bed;with call bell/phone within reach;with bed alarm set;with SCD's reapplied;with restraints reapplied   Nurse Communication Mobility status        Time: 0920-0950 OT Time Calculation (min): 30 min  Charges: OT General Charges $OT Visit: 1 Visit OT Treatments $Self Care/Home Management : 8-22 mins  Delight Stare, [place] Pager [phone] Office [phone]    Delight Stare 03/11/2019, 10:01 AM

## 2019-03-11 NOTE — Progress Notes (Signed)
Physical Therapy Treatment Patient Details Name: David [name] MRN: [id number] DOB: 1932/07/02 Today's Date: 03/11/2019    History of Present Illness David [name] is an 85 y.o. 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  CT showing potential infarct L medial cerebellum    PT Comments    Patient seen for mobility progression. Pt agreeable to participate in therapy. Pt continues to require +2 assist for bed mobility however much more participatory during session today vs yesterday and min/mod A +1 for sitting EOB. Pt followed cues 50% time during session. Continue to progress as tolerated.    Follow Up Recommendations  CIR;Supervision/Assistance - 24 hour     Equipment Recommendations  Other (comment)(TBD)    Recommendations for Other Services       Precautions / Restrictions Precautions Precautions: Fall Restrictions Weight Bearing Restrictions: No    Mobility  Bed Mobility Overal bed mobility: Needs Assistance Bed Mobility: Supine to Sit;Sit to Supine     Supine to sit: +2 for physical assistance;Total assist Sit to supine: +2 for physical assistance;Total assist   General bed mobility comments: multimodal cues for sequencing and hand over hand assist for using bed rail; assist for all aspects of bed mobility   Transfers                 General transfer comment: deferred due to safety/fatigue   Ambulation/Gait                 Stairs             Wheelchair Mobility    Modified Rankin (Stroke Patients Only)       Balance Overall balance assessment: Needs assistance Sitting-balance support: Single extremity supported;Feet supported;Bilateral upper extremity supported Sitting balance-Leahy Scale: Poor Sitting balance -  Comments: sitting EOB with mod-minA (at best) while engaging in functional ADLs using L UE  Postural control: Right lateral lean                                  Cognition Arousal/Alertness: Lethargic Behavior During Therapy: [facility] for tasks assessed/performed;Restless Overall Cognitive Status: Impaired/Different from baseline Area of Impairment: Attention;Following commands;Awareness;Problem solving                   Current Attention Level: Sustained Memory: Decreased short-term memory;Decreased recall of precautions Following Commands: Follows one step commands inconsistently;Follows one step commands with increased time Safety/Judgement: Decreased awareness of safety;Decreased awareness of deficits Awareness: Intellectual Problem Solving: Slow processing;Requires verbal cues;Requires tactile cues;Decreased initiation;Difficulty sequencing General Comments: pt following approx 50% commands, require cueing to attend to R side of body and visual field; tries to mouth word and nod head during session; decreased impulsivity today;difficult to assess due to trach       Exercises      General Comments General comments (skin integrity, edema, etc.): suctioned prior to session; HR fluctuates up to 130s when supine      Pertinent Vitals/Pain Pain Assessment: Faces Faces Pain Scale: Hurts a little bit Pain Location: unspecified (grimacing with mobility) Pain Descriptors / Indicators: Grimacing Pain Intervention(s): Limited activity within patient's tolerance;Monitored during session;Repositioned    Home Living  Prior Function            PT Goals (current goals can now be found in the care plan section) Acute Rehab PT Goals Patient Stated Goal: none stated Progress towards PT goals: Progressing toward goals    Frequency    Min 3X/week      PT Plan Current plan remains appropriate    Co-evaluation PT/OT/SLP  Co-Evaluation/Treatment: Yes Reason for Co-Treatment: Complexity of the patient's impairments (multi-system involvement);Necessary to address cognition/behavior during functional activity;For patient/therapist safety;To address functional/ADL transfers PT goals addressed during session: Mobility/safety with mobility;Balance;Strengthening/ROM        AM-PAC PT "6 Clicks" Mobility   Outcome Measure  Help needed turning from your back to your side while in a flat bed without using bedrails?: Total Help needed moving from lying on your back to sitting on the side of a flat bed without using bedrails?: Total Help needed moving to and from a bed to a chair (including a wheelchair)?: Total Help needed standing up from a chair using your arms (e.g., wheelchair or bedside chair)?: Total Help needed to walk in hospital room?: Total Help needed climbing 3-5 steps with a railing? : Total 6 Click Score: 6    End of Session Equipment Utilized During Treatment: Oxygen Activity Tolerance: Patient limited by fatigue Patient left: in bed;with call bell/phone within reach;with bed alarm set;with SCD's reapplied Nurse Communication: Mobility status PT Visit Diagnosis: Unsteadiness on feet (R26.81);Other abnormalities of gait and mobility (R26.89);Muscle weakness (generalized) (M62.81);Other symptoms and signs involving the nervous system (R29.898)     Time: 1610-9604 PT Time Calculation (min) (ACUTE ONLY): 30 min  Charges:  $Therapeutic Activity: 8-22 mins                     Earney Navy, PTA Acute Rehabilitation Services Pager: [phone] Office: [phone]     Darliss Cheney 03/11/2019, 1:08 PM

## 2019-03-11 NOTE — Progress Notes (Signed)
STROKE TEAM PROGRESS NOTE   INTERVAL HISTORY Patient appears to be doing better today.  He is more alert and interactive and cooperating with therapies.  Vitals:   03/11/19 0410 03/11/19 0813 03/11/19 1150 03/11/19 1155  BP: 133/70 (!) 180/57  121/65  Pulse: 91 68 78 75  Resp: 18 19 16 18   Temp: 97.7 F (36.5 C) (!) 97.5 F (36.4 C)  97.8 F (36.6 C)  TempSrc: Axillary Axillary  Oral  SpO2: 95% 95% 95% 98%  Weight:      Height:        CBC:  Recent Labs  Lab 03/05/19 1738  03/10/19 0442 03/11/19 0350  WBC 6.6   < > 11.7* 10.2  NEUTROABS 4.5  --   --   --   HGB 12.3*   < > 12.4* 12.5*  HCT 39.4   < > 37.8* 39.8  MCV 103.1*   < > 102.7* 102.1*  PLT 201   < > 196 179   < > = values in this interval not displayed.    Basic Metabolic Panel:  Recent Labs  Lab 03/06/19 0222  03/10/19 0442 03/11/19 0350  NA  --    < > 140 142  K  --    < > 4.0 4.3  CL  --    < > 107 109  CO2  --    < > 22 22  GLUCOSE  --    < > 143* 132*  BUN  --    < > 39* 39*  CREATININE  --    < > 1.29* 1.19  CALCIUM  --    < > 9.0 8.9  MG 1.9  --   --   --    < > = values in this interval not displayed.   IMAGING No results found.    PHYSICAL EXAM: General - Well nourished, well developed, drowsy and sleepy.  Ophthalmologic - fundi not visualized due to noncooperation.  Cardiovascular - irregularly irregular heart rate and rhythm.  Atrial fibrillation  Neuro -awake alert and follows occasional commands.    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 Paucity of speech but able to mouth his name and answer yes or no, no obvious aphasia. Still has chronic right hemianopia, right nasolabial fold flattening. Tongue midline, no gaze preference, PERRL, EOMI. Moving all extremities on command. Sensation, coordination not corporative and gait not tested.   ASSESSMENT/PLAN David [name] is a 85 y.o. male with history of laryngeal cancer status post tracheostomy, hyperlipidemia, hypertension, type 2  diabetes mellitus, prior right PCA stroke with right homonymous hemianopsia, atrial fibrillation not on anticoagulation found down, presenting with R sided weakness. Received tPA 03/05/2019 at 1755.  Stroke:  Multifocal infarcts including left thalamus, bilateral medial temporal lobes and the left cerebellum, embolic pattern secondary to known AF not on [facility]  Code Stroke CT head small acute L medial cerebellar stroke. Extensive small vessel disease. Old L PCA infarct. ASPECTS 10.     CTA head & neck occlusion L PCA at P2. L PICA occlusion. L VA occlusion at origin V1. B ICA < 50%. Aortic atherosclerosis. Laryngeal surgical changes w/ trach.  MRI - Multifocal bilateral posterior circulation acute ischemic infarcts, including the left thalamus, both medial temporal lobes and the left cerebellum. Multiple old infarcts.   2D Echo - EF 40 - 45%. RA cannot r/o small mobile thrombus see last image vs shadowing artifact. (currently on Eliquis)  LDL 38  HgbA1c 6.6  Eliquis  for VTE prophylaxis  No antithrombotic prior to admission, now on eliquis for stroke prevention  Therapy recommendations:  CIR  Disposition:  pending - hope for d/c to CIR tomorrow  Atrial Fibrillation w/ RVR and frequent PVCs  Home anticoagulation:  none   Stopped AC d/t hematuria - off as of 12/18/2018 by FP MD. HGB stable over time. Urine only pink color as per pt. Per pt has been pink since eliquis started in 2019  Put on metoprolol low dose for rate control  Frequent PVCs on telemetry . ContinueEliquis (apixaban) daily at discharge . Urology is OK for eliquis    Hx stroke/TIA  11/2017 - Late acute/early subacute infarction Left occipital lobe and posteromedial temporal lobe punctate areas of infarction in left posterior thalamus and left crus of fornix. Embolic from AF not on AC. Discharged on eliquis. Resultant R HH.   TIA with aphasia for 20 min in 04/2017  Hematuria  Per pt has been pink since eliquis started in  2019  eliquis stopped in 11/2018  Pt followed with Dr. Diona Fanti and found to have UTI, was put on Abx twice, next appointment 03/11/19  UA this admission neg for RBC, WBC 6-10  H&H stable   Eliquis restarted after discuss with urology on call MD  Urinary retention with UTI  Status post in and out cath  Mild traumatic hematuria  UA showed RBC 21-50  UA WBC 11-20, increased from 2 days ago 6-10  Start Rocephin 4/12 (7D)  Urine culture > 100k klebsella oxytoca. Sensitive to ceftriaxone   Foley reinserted 03/09/2019  Copious secretion from laryngectomy  Leukocytosis  History of laryngeal cancer stat post laryngectomy  Copious secretion from laryngectomy site  Suctioning PRN  WBC 11.7  On Rocephin 4/12, plan 7 days  CXR 03/08/2019 patchy bibasilar opacities, no consolidation or pneumonia  May consider indomethacin consult if getting worse  Cognitive impairment, lethargy  Wife stated that pt does have cognitive impairment at home  Patient overnight agitation with sundowning  Received Haldol and Ativan for MRI  Lethargy. ? R/t med effect, UTI, PNA. Decreased seroquel dose from 50 to 25 mg hs. Continue abx.    Hypertension  Home meds: HCTZ 25, ramipril 5  Stable . On metoprolol for rate control and BP . Long-term BP goal normotensive  Hyperlipidemia  Home meds:  zocor 40, resumed in hospital  LDL 38, goal < 70  Decrease zocor dose to 20  Continue statin at discharge  Diabetes type II Controlled  Home meds:  metformin  HgbA1c 6.6, goal < 7.0  CBGs  SSI  Resume metformin on discharge  Other Stroke Risk Factors  Advanced age  Former Cigarette smoker, quit 27 yrs ago  Overweight, Body mass index is 28.66 kg/m., recommend weight loss, diet and exercise as appropriate   CHF - CXR no acute disease or edema - on diet - d/c IVF  Other Active Problems  Renal insufficiency, resolved Cr 1.19  Elevated MCV 102.7. VB12 elevated 1386, folate  pending   Hospital day # 6  Patient seems more alert after Seroquel dose was reduced.  Continue present dose of 25 mg at bedtime.  Continue participation with therapies.  Hopefully transfer to inpatient rehab over the next few days.  Antony Contras, MD  Medical Director [facility] Pager: [phone] 03/11/2019 1:00 PM   To contact Stroke Continuity provider, please refer to [email or web address] After hours, contact General Neurology

## 2019-03-11 NOTE — Progress Notes (Signed)
Inpatient Rehab Admissions Coordinator:   I was able to speak with pt at the bedside.  He is more alert today and able to nod yes/no, and mouth words.  Discussed with OT and she reports pt performed much better with therapy today.  Will continue to follow for participation and tolerance for possible rehab admission later this week.    Shann Medal, PT, DPT Admissions Coordinator [phone] 03/11/19  10:47 AM

## 2019-03-12 ENCOUNTER — Inpatient Hospital Stay (HOSPITAL_COMMUNITY)
Admission: RE | Admit: 2019-03-12 | Discharge: 2019-03-27 | DRG: 057 | Disposition: E | Payer: Medicare Other | Source: Intra-hospital | Attending: Physical Medicine & Rehabilitation | Admitting: Physical Medicine & Rehabilitation

## 2019-03-12 ENCOUNTER — Other Ambulatory Visit: Payer: Self-pay

## 2019-03-12 DIAGNOSIS — R319 Hematuria, unspecified: Secondary | ICD-10-CM | POA: Diagnosis not present

## 2019-03-12 DIAGNOSIS — R0602 Shortness of breath: Secondary | ICD-10-CM

## 2019-03-12 DIAGNOSIS — I69318 Other symptoms and signs involving cognitive functions following cerebral infarction: Secondary | ICD-10-CM

## 2019-03-12 DIAGNOSIS — Z7984 Long term (current) use of oral hypoglycemic drugs: Secondary | ICD-10-CM | POA: Diagnosis not present

## 2019-03-12 DIAGNOSIS — Z888 Allergy status to other drugs, medicaments and biological substances status: Secondary | ICD-10-CM

## 2019-03-12 DIAGNOSIS — Z930 Tracheostomy status: Secondary | ICD-10-CM | POA: Diagnosis not present

## 2019-03-12 DIAGNOSIS — F0150 Vascular dementia without behavioral disturbance: Secondary | ICD-10-CM | POA: Diagnosis present

## 2019-03-12 DIAGNOSIS — H53461 Homonymous bilateral field defects, right side: Secondary | ICD-10-CM

## 2019-03-12 DIAGNOSIS — I6381 Other cerebral infarction due to occlusion or stenosis of small artery: Secondary | ICD-10-CM | POA: Diagnosis present

## 2019-03-12 DIAGNOSIS — I272 Pulmonary hypertension, unspecified: Secondary | ICD-10-CM | POA: Diagnosis present

## 2019-03-12 DIAGNOSIS — E785 Hyperlipidemia, unspecified: Secondary | ICD-10-CM | POA: Diagnosis present

## 2019-03-12 DIAGNOSIS — Z87891 Personal history of nicotine dependence: Secondary | ICD-10-CM

## 2019-03-12 DIAGNOSIS — R339 Retention of urine, unspecified: Secondary | ICD-10-CM | POA: Diagnosis not present

## 2019-03-12 DIAGNOSIS — I69351 Hemiplegia and hemiparesis following cerebral infarction affecting right dominant side: Principal | ICD-10-CM

## 2019-03-12 DIAGNOSIS — I469 Cardiac arrest, cause unspecified: Secondary | ICD-10-CM | POA: Diagnosis not present

## 2019-03-12 DIAGNOSIS — N390 Urinary tract infection, site not specified: Secondary | ICD-10-CM | POA: Diagnosis present

## 2019-03-12 DIAGNOSIS — E119 Type 2 diabetes mellitus without complications: Secondary | ICD-10-CM | POA: Diagnosis present

## 2019-03-12 DIAGNOSIS — E870 Hyperosmolality and hypernatremia: Secondary | ICD-10-CM | POA: Diagnosis not present

## 2019-03-12 DIAGNOSIS — I69311 Memory deficit following cerebral infarction: Secondary | ICD-10-CM | POA: Diagnosis not present

## 2019-03-12 DIAGNOSIS — I4891 Unspecified atrial fibrillation: Secondary | ICD-10-CM | POA: Diagnosis not present

## 2019-03-12 DIAGNOSIS — I69320 Aphasia following cerebral infarction: Secondary | ICD-10-CM

## 2019-03-12 DIAGNOSIS — I639 Cerebral infarction, unspecified: Secondary | ICD-10-CM

## 2019-03-12 DIAGNOSIS — I10 Essential (primary) hypertension: Secondary | ICD-10-CM | POA: Diagnosis not present

## 2019-03-12 DIAGNOSIS — I69322 Dysarthria following cerebral infarction: Secondary | ICD-10-CM

## 2019-03-12 DIAGNOSIS — Z8521 Personal history of malignant neoplasm of larynx: Secondary | ICD-10-CM | POA: Diagnosis not present

## 2019-03-12 LAB — GLUCOSE, CAPILLARY
Glucose-Capillary: 114 mg/dL — ABNORMAL HIGH (ref 70–99)
Glucose-Capillary: 179 mg/dL — ABNORMAL HIGH (ref 70–99)
Glucose-Capillary: 150 mg/dL — ABNORMAL HIGH (ref 70–99)
Glucose-Capillary: 102 mg/dL — ABNORMAL HIGH (ref 70–99)

## 2019-03-12 LAB — FOLATE RBC
Folate, Hemolysate: >620 ng/mL
Folate, RBC: >1708 ng/mL (ref >498)
Hematocrit: 36.3 % — ABNORMAL LOW (ref 37.5–51.0)

## 2019-03-12 MED ORDER — METOPROLOL TARTRATE 25 MG PO TABS: 12.5000 mg | ORAL_TABLET | Freq: Two times a day (BID) | ORAL | Status: AC

## 2019-03-12 MED ORDER — INSULIN ASPART 100 UNIT/ML ~~LOC~~ SOLN: 0.0000 [IU] | Freq: Three times a day (TID) | SUBCUTANEOUS | 11 refills | Status: AC

## 2019-03-12 MED ORDER — ENSURE ENLIVE PO LIQD: 237.0000 mL | Freq: Two times a day (BID) | ORAL | 12 refills | Status: AC

## 2019-03-12 MED ORDER — QUETIAPINE FUMARATE 25 MG PO TABS
25.0000 mg | ORAL_TABLET | Freq: Every day | ORAL | Status: DC
  Administered 2019-03-12 – 2019-03-14 (×3): 25 mg via ORAL
  Filled 2019-03-12 (×3): qty 1

## 2019-03-12 MED ORDER — SIMVASTATIN 20 MG PO TABS
20.0000 mg | ORAL_TABLET | Freq: Every day | ORAL | Status: DC
  Administered 2019-03-12 – 2019-03-24 (×13): 20 mg via ORAL
  Filled 2019-03-12 (×12): qty 1

## 2019-03-12 MED ORDER — TAMSULOSIN HCL 0.4 MG PO CAPS
0.4000 mg | ORAL_CAPSULE | Freq: Two times a day (BID) | ORAL | Status: DC
  Administered 2019-03-12 – 2019-03-24 (×24): 0.4 mg via ORAL
  Filled 2019-03-12 (×24): qty 1

## 2019-03-12 MED ORDER — METOPROLOL TARTRATE 12.5 MG HALF TABLET
12.5000 mg | ORAL_TABLET | Freq: Two times a day (BID) | ORAL | Status: DC
  Administered 2019-03-12 – 2019-03-24 (×22): 12.5 mg via ORAL
  Filled 2019-03-12 (×23): qty 1

## 2019-03-12 MED ORDER — ACETAMINOPHEN 325 MG PO TABS
650.0000 mg | ORAL_TABLET | ORAL | Status: DC | PRN
  Filled 2019-03-12: qty 2

## 2019-03-12 MED ORDER — SODIUM CHLORIDE 0.9 % IV SOLN: 1.0000 g | INTRAVENOUS | Status: AC

## 2019-03-12 MED ORDER — INSULIN ASPART 100 UNIT/ML ~~LOC~~ SOLN
0.0000 [IU] | Freq: Three times a day (TID) | SUBCUTANEOUS | Status: DC
  Administered 2019-03-13 – 2019-03-14 (×2): 1 [IU] via SUBCUTANEOUS
  Administered 2019-03-15: 19:00:00 2 [IU] via SUBCUTANEOUS
  Administered 2019-03-15 – 2019-03-18 (×5): 1 [IU] via SUBCUTANEOUS
  Administered 2019-03-21 (×2): 3 [IU] via SUBCUTANEOUS
  Administered 2019-03-22 (×2): 2 [IU] via SUBCUTANEOUS

## 2019-03-12 MED ORDER — ACETAMINOPHEN 650 MG RE SUPP: 650.0000 mg | RECTAL | Status: DC | PRN

## 2019-03-12 MED ORDER — ACETAMINOPHEN 160 MG/5ML PO SOLN: 650.0000 mg | ORAL | Status: DC | PRN

## 2019-03-12 MED ORDER — SENNOSIDES-DOCUSATE SODIUM 8.6-50 MG PO TABS
1.0000 | ORAL_TABLET | Freq: Every evening | ORAL | Status: DC | PRN
  Administered 2019-03-16: 16:00:00 1 via ORAL
  Filled 2019-03-12: qty 1

## 2019-03-12 MED ORDER — APIXABAN 5 MG PO TABS
5.0000 mg | ORAL_TABLET | Freq: Two times a day (BID) | ORAL | Status: DC
  Administered 2019-03-12 – 2019-03-24 (×24): 5 mg via ORAL
  Filled 2019-03-12 (×24): qty 1

## 2019-03-12 MED ORDER — APIXABAN 5 MG PO TABS: 5.0000 mg | ORAL_TABLET | Freq: Two times a day (BID) | ORAL | Status: AC

## 2019-03-12 MED ORDER — QUETIAPINE FUMARATE 25 MG PO TABS: 25.0000 mg | ORAL_TABLET | Freq: Every day | ORAL | Status: AC

## 2019-03-12 MED ORDER — SIMVASTATIN 20 MG PO TABS: 20.0000 mg | ORAL_TABLET | Freq: Every day | ORAL | Status: AC

## 2019-03-12 MED ORDER — SODIUM CHLORIDE 0.9 % IV SOLN
1.0000 g | INTRAVENOUS | Status: AC
  Administered 2019-03-13 – 2019-03-16 (×4): 1 g via INTRAVENOUS
  Filled 2019-03-12 (×4): qty 10

## 2019-03-12 MED ORDER — ENSURE ENLIVE PO LIQD
237.0000 mL | Freq: Two times a day (BID) | ORAL | Status: DC
  Administered 2019-03-13 – 2019-03-19 (×6): 237 mL via ORAL

## 2019-03-12 MED ORDER — PANTOPRAZOLE SODIUM 40 MG PO TBEC
40.0000 mg | DELAYED_RELEASE_TABLET | Freq: Every day | ORAL | Status: DC
  Administered 2019-03-13 – 2019-03-22 (×9): 40 mg via ORAL
  Filled 2019-03-12 (×9): qty 1

## 2019-03-12 NOTE — PMR Pre-admission (Signed)
PMR Admission Coordinator Pre-Admission Assessment  Patient: David [name] is an 85 y.o., male MRN: [id number] DOB: 01-31-1932 Height: _0  (190.5 cm) Weight: 104 kg              Insurance Information HMO: yes    PPO:      PCP:      IPA:      80/20:      OTHER:  PRIMARY: BCBS Medicare      Policy#: [id number]      Subscriber: patient CM Name: David [name]      Phone#: [phone]     Fax#: [phone] Pre-Cert#: [id number]      Employer:  Benefits:  Phone #: [phone]     Name:  Eff. Date: 11/26/2018     Deduct: $0      Out of Pocket Max: $3900 (met $139.95)      Life Max: n/a CIR: $310/day 1-6, $0/day 7+      SNF: $0/day 1-20, $178/day 21-60, $0/day 61-100 Outpatient: $40/visit     Co-Pay:  Home Health: 100%      Co-Pay:  DME: 80%     Co-Pay: 20%  SECONDARY:       Policy#:       Subscriber:  CM Name:       Phone#:      Fax#:  Pre-Cert#:       Employer:  Benefits:  Phone #:      Name:  Eff. Date:      Deduct:       Out of Pocket Max:       Life Max:  CIR:       SNF:  Outpatient:      Co-Pay:  Home Health:       Co-Pay:  DME:      Co-Pay:   Medicaid Application Date:       Case Manager:  Disability Application Date:       Case Worker:   The "Data Collection Information Summary" for patients in Inpatient Rehabilitation Facilities with attached "Privacy Act [place] Records" was provided and verbally reviewed with: Family  Emergency Contact Information Contact Information    Name Relation Home Work Mobile   David [name] Spouse [phone]       Current Medical History  Patient Admitting Diagnosis: B CVA  History of Present Illness: AUSTIN HERD is an 85 year old right-handed male history of diabetes mellitus, laryngeal cancer status post laryngectomy, prior right PCA infarction with right homonymous hemianopsia as well as cognitive impairment, atrial fibrillation not on anticoagulation due to bouts of hematuria. Presented 03/05/2019 after being found down on the  floor with right-sided weakness and facial droop. Blood pressure 184/182. CT of the head showed small area of acute infarction left medial cerebellum. CT angiogram of head and neck occlusion of the left posterior cerebral artery at the proximal P2 segment area left PICA occlusion. Patient did not receive TPA. MRI the brain showed multifocal bilateral posterior circulation acute ischemic infarctions involving the left thalamus both medial temporal lobes in the left cerebellum. No hemorrhage or mass effect. Echocardiogram with ejection fraction of 45% mild to moderate reduced systolic function. Inferior basal wall hypokinesis. Neurology service is consulted maintained on Eliquis after being cleared by urology services for noted history of hematuria. Urine culture greater than 100,000 Klebsiella oxytoca and completing a course of Rocephin.patient noted history of laryngeal cancer with laryngectomy noted fair amount of copious secretions latest chest x-ray showed patchy bibasilar opacities, no consolidation  or pneumonia. Tolerating a regular diet.   Complete NIHSS TOTAL: 13  Past Medical History  Past Medical History:  Diagnosis Date  . Cancer of neck ([place])   . History of atrial fibrillation   . Hyperkalemia    with Ramipril dose at 10 mg, K wnl at 5 mg. per day  . Hyperlipidemia   . Hypertension    Pulmonary  . Larynx cancer ([place])   . Right homonymous hemianopsia    David [name] 12/09/2017  . Stroke ([place]) 11/2017   peripheral vision loss both eyes/notes 12/09/2017  . Type II diabetes mellitus ([place])   . Urinary retention   . Vertebral fracture, osteoporotic ([facility])     Family History  family history includes Cirrhosis in his father.  Prior Rehab/Hospitalizations:  Has the patient had prior rehab or hospitalizations prior to admission? No  Has the patient had major surgery during 100 days prior to admission? No  Current Medications   Current Facility-Administered Medications:  .   stroke: mapping  our early stages of recovery book, , Does not apply, Once, Aroor, Karena Addison R, MD .  0.9 %  sodium chloride infusion, , Intravenous, Continuous, Rosalin Hawking, MD, Last Rate: 40 mL/hr at 03/11/19 0728 .  acetaminophen (TYLENOL) tablet 650 mg, 650 mg, Oral, Q4H PRN **OR** acetaminophen (TYLENOL) solution 650 mg, 650 mg, Per Tube, Q4H PRN **OR** acetaminophen (TYLENOL) suppository 650 mg, 650 mg, Rectal, Q4H PRN, Aroor, Karena Addison R, MD .  apixaban (ELIQUIS) tablet 5 mg, 5 mg, Oral, BID, Skeet Simmer, RPH, 5 mg at 03/09/2019 5621 .  cefTRIAXone (ROCEPHIN) 1 g in sodium chloride 0.9 % 100 mL IVPB, 1 g, Intravenous, Q24H, Biby, Sharon L, NP, Last Rate: 200 mL/hr at 03/11/19 1441, 1 g at 03/11/19 1441 .  feeding supplement (ENSURE ENLIVE) (ENSURE ENLIVE) liquid 237 mL, 237 mL, Oral, BID BM, Leonie Man, Pramod S, MD, 237 mL at 03/11/19 1441 .  haloperidol lactate (HALDOL) injection 2 mg, 2 mg, Intravenous, Q8H PRN, Rosalin Hawking, MD, 2 mg at 03/09/19 0159 .  insulin aspart (novoLOG) injection 0-9 Units, 0-9 Units, Subcutaneous, TID WC, Rosalin Hawking, MD, 2 Units at 03/23/2019 1208 .  metoprolol tartrate (LOPRESSOR) tablet 12.5 mg, 12.5 mg, Oral, BID, Rosalin Hawking, MD, 12.5 mg at 03/11/2019 0907 .  pantoprazole (PROTONIX) EC tablet 40 mg, 40 mg, Oral, Daily, Rosalin Hawking, MD, 40 mg at 03/24/2019 0907 .  QUEtiapine (SEROQUEL) tablet 25 mg, 25 mg, Oral, QHS, Garvin Fila, MD, 25 mg at 03/11/19 2150 .  senna-docusate (Senokot-S) tablet 1 tablet, 1 tablet, Oral, QHS PRN, Aroor, Karena Addison R, MD .  simvastatin (ZOCOR) tablet 20 mg, 20 mg, Oral, QHS, Biby, Sharon L, NP, 20 mg at 03/11/19 2150 .  tamsulosin (FLOMAX) capsule 0.4 mg, 0.4 mg, Oral, BID, Rosalin Hawking, MD, 0.4 mg at 03/26/2019 0907  Patients Current Diet:  Diet Order            Diet heart healthy/carb modified Room service appropriate? No; Fluid consistency: Thin  Diet effective now              Precautions / Restrictions Precautions Precautions:  Fall Restrictions Weight Bearing Restrictions: No   Has the patient had 2 or more falls or a fall with injury in the past year?Yes  Prior Activity Level Community (5-7x/wk): very independent up until fall in July, has been more limited by back pain since that time  Prior Functional Level Prior Function Level of Independence: Independent with assistive device(s) Comments: reports  intermittent use of SPC  Self Care: Did the patient need help bathing, dressing, using the toilet or eating?  Independent  Indoor Mobility: Did the patient need assistance with walking from room to room (with or without device)? Independent  Stairs: Did the patient need assistance with internal or external stairs (with or without device)? Independent  Functional Cognition: Did the patient need help planning regular tasks such as shopping or remembering to take medications? Independent  Home Assistive Devices / Equipment Home Assistive Devices/Equipment: Bedside commode/3-in-1, Cane (specify quad or straight)  Prior Device Use: Indicate devices/aids used by the patient prior to current illness, exacerbation or injury? cane  Current Functional Level Cognition  Arousal/Alertness: Awake/alert Overall Cognitive Status: Impaired/Different from baseline Current Attention Level: Sustained Orientation Level: Oriented to person, Disoriented to place, Disoriented to situation, Disoriented to time Following Commands: Follows one step commands inconsistently, Follows one step commands with increased time Safety/Judgement: Decreased awareness of safety, Decreased awareness of deficits General Comments: pt following approx 50% commands, require cueing to attend to R side of body and visual field; tries to mouth word and nod head during session; decreased impulsivity today  Attention: Focused, Sustained Focused Attention: Appears intact Sustained Attention: Appears intact Awareness: Impaired Awareness Impairment:  Intellectual impairment Problem Solving: Impaired Problem Solving Impairment: Verbal complex(1/3)    Extremity Assessment (includes Sensation/Coordination)  Upper Extremity Assessment: RUE deficits/detail RUE Deficits / Details: some ataxic movements noted; discoordination; pt with amputated digits (baseline)   RUE Coordination: decreased fine motor  Lower Extremity Assessment: Defer to PT evaluation RLE Deficits / Details: No overt focal weaknesses, hip flexors 2+/3-/5, quad 4-, hams >4, df/pf >3/5 RLE Coordination: decreased fine motor, decreased gross motor LLE Deficits / Details: hip flexor weakest at 2+/3_/5, o/w 4/5  [name] more coordinated than Right, but some incoordination.    ADLs  Overall ADL's : Needs assistance/impaired Eating/Feeding: Set up, Supervision/ safety, Sitting Eating/Feeding Details (indicate cue type and reason): requires support for sitting Grooming: Wash/dry face, Sitting, Minimal assistance, Oral care Grooming Details (indicate cue type and reason): oral care with min assist using suction swab, requires assist to initate washing face but able to complete task, perseverates on task  Upper Body Bathing: Minimal assistance, Sitting Lower Body Bathing: Moderate assistance, +2 for physical assistance, +2 for safety/equipment, Sit to/from stand Upper Body Dressing : Sitting, Minimal assistance Lower Body Dressing: +2 for physical assistance, Maximal assistance, +2 for safety/equipment, Sit to/from stand Lower Body Dressing Details (indicate cue type and reason): modA +2 for standing balance Toilet Transfer: Moderate assistance, +2 for physical assistance, +2 for safety/equipment, Stand-pivot Toilet Transfer Details (indicate cue type and reason): deferred due to safety/fatigue Toileting- Clothing Manipulation and Hygiene: Maximal assistance, +2 for physical assistance, +2 for safety/equipment, Sit to/from stand Functional mobility during ADLs: +2 for physical assistance,  +2 for safety/equipment, Total assistance(limited to EOB) General ADL Comments: pt continues to be limited by cognition, coordination, balance and act tolerance    Mobility  Overal bed mobility: Needs Assistance Bed Mobility: Supine to Sit, Sit to Supine Supine to sit: +2 for physical assistance, Total assist Sit to supine: +2 for physical assistance, Total assist General bed mobility comments: multimodal cues for sequencing and hand over hand assist for using bed rail; assist for all aspects of bed mobility     Transfers  Overall transfer level: Needs assistance Equipment used: 2 person hand held assist Transfers: Sit to/from Stand Sit to Stand: +2 physical assistance, Max assist Stand pivot transfers: Mod assist, +  2 physical assistance, +2 safety/equipment General transfer comment: deferred due to safety/fatigue     Ambulation / Gait / Stairs / Wheelchair Mobility  Ambulation/Gait Ambulation/Gait assistance: Mod assist Gait Distance (Feet): 15 Feet(to toilet then additional 40 feet) Assistive device: IV Pole, 1 person hand held assist Gait Pattern/deviations: Step-through pattern General Gait Details: unable to get pt upright enough to safely ambulate Gait velocity interpretation: <1.31 ft/sec, indicative of household ambulator    Posture / Balance Dynamic Sitting Balance Sitting balance - Comments: sitting EOB with mod-minA (at best) while engaging in functional ADLs using [name] UE  Balance Overall balance assessment: Needs assistance Sitting-balance support: Single extremity supported, Feet supported, Bilateral upper extremity supported Sitting balance-Leahy Scale: Poor Sitting balance - Comments: sitting EOB with mod-minA (at best) while engaging in functional ADLs using [name] UE  Postural control: Right lateral lean Standing balance-Leahy Scale: Zero Standing balance comment: needs external support    Special needs/care consideration BiPAP/CPAP no CPM no Continuous Drip IV  Rocephin Dialysis no        Days n/a Life Vest no Oxygen 5L per min via trach collar Special Bed no Trach Size n/a Wound Vac (area) no      Location n/a Skin ecchymosis to BUEs, skin tear to buttocks and BUEs                               Bowel mgmt: incontinent, last BM 03/09/2019 Bladder mgmt: incontintent, foley Diabetic mgmt yes, oral medications Behavioral consideration  no Chemo/radiation  no     Previous Home Environment (from acute therapy documentation) Living Arrangements: Spouse/significant other  Lives With: Spouse Available Help at Discharge: Family, Available 24 hours/day Type of Home: House Home Layout: One level Home Access: Stairs to enter Entrance Stairs-Rails: Right, Left Entrance Stairs-Number of Steps: 5 Bathroom Shower/Tub: Multimedia programmer: Standard Bathroom Accessibility: Yes How Accessible: Accessible via walker  Discharge Living Setting Plans for Discharge Living Setting: Patient's home Type of Home at Discharge: House Discharge Home Layout: One level Discharge Home Access: Stairs to enter Entrance Stairs-Rails: Can reach both Entrance Stairs-Number of Steps: 5(wife plans to have ramp built) Discharge Bathroom Shower/Tub: Walk-in shower Discharge Bathroom Toilet: Standard Discharge Bathroom Accessibility: Yes How Accessible: Accessible via walker Does the patient have any problems obtaining your medications?: No  Social/Family/Support Systems Patient Roles: Spouse Anticipated Caregiver: wife David [name], and hired caregivers if needed Anticipated Caregiver's Contact Information: David [name] [phone] Ability/Limitations of Caregiver: has a bad back, can only provide supervision Caregiver Availability: 24/7 Discharge Plan Discussed with Primary Caregiver: Yes Is Caregiver In Agreement with Plan?: Yes Does Caregiver/Family have Issues with Lodging/Transportation while Pt is in Rehab?: No   Goals/Additional Needs Patient/Family Goal for  Rehab: PT/OT/SLP, min to mod assist Expected length of stay: 20-27 days Cultural Considerations: none Dietary Needs: heart healthy/carb modified, thin Equipment Needs: tbd Pt/Family Agrees to Admission and willing to participate: Yes Program Orientation Provided & Reviewed with Pt/Caregiver Including Roles  & Responsibilities: Yes   Possible need for SNF placement upon discharge: not anticipated   Patient Condition: This patient's medical and functional status has changed since the consult dated: 03/09/2019 in which the Rehabilitation Physician determined and documented that the patient's condition is appropriate for intensive rehabilitative care in an inpatient rehabilitation facility. See "History of Present Illness" (above) for medical update. Functional changes are: max/total assist. Patient's medical and functional status update has been discussed with the Rehabilitation physician  and patient remains appropriate for inpatient rehabilitation. Will admit to inpatient rehab today.  Preadmission Screen Completed By:  David [name], PT, 03/21/2019 3:16 PM ______________________________________________________________________   Discussed status with Dr. Letta Pate on 03/03/2019 at 3:16 PM  and received approval for admission today.  Admission Coordinator:  David [name], time 3:16 PM Sudie Grumbling 03/21/2019

## 2019-03-12 NOTE — Progress Notes (Addendum)
Physical Therapy Treatment Patient Details Name: David [name] MRN: [id number] DOB: 20-Sep-1932 Today's Date: 03/03/2019    History of Present Illness David [name] is an 85 y.o. 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  CT showing potential infarct L medial cerebellum    PT Comments    Patient seen for mobility progression. Pt is following commands 50% of time and more participatory/communicative this session. Pt is able to maintain sitting balance EOB at times with min A. Continues to demonstrate R lateral lean and R side inattention. Continue to recommend CIR for further skilled PT services to maximize independence and safety with mobility.     Follow Up Recommendations  CIR;Supervision/Assistance - 24 hour     Equipment Recommendations  Other (comment)(TBD)    Recommendations for Other Services       Precautions / Restrictions Precautions Precautions: Fall Restrictions Weight Bearing Restrictions: No    Mobility  Bed Mobility Overal bed mobility: Needs Assistance Bed Mobility: Supine to Sit;Sit to Supine     Supine to sit: +2 for physical assistance;Total assist;Max assist Sit to supine: +2 for physical assistance;Total assist   General bed mobility comments: pt assisting with bilat LE more this session; multimodal cues for sequencing and hand over hand assist for using bed rail; assist for all aspects of bed mobility   Transfers                 General transfer comment: deferred due to safety/fatigue   Ambulation/Gait                 Stairs             Wheelchair Mobility    Modified Rankin (Stroke Patients Only)       Balance Overall balance assessment: Needs assistance Sitting-balance support: Single extremity  supported;Feet supported;Bilateral upper extremity supported Sitting balance-Leahy Scale: Poor Sitting balance - Comments: worked on sitting balance EOB and at times pt is able to maintain sitting with min A; pt is able to correct to midline posturing with cues  Postural control: Right lateral lean                                  Cognition Arousal/Alertness: Lethargic Behavior During Therapy: [facility] for tasks assessed/performed;Restless Overall Cognitive Status: Impaired/Different from baseline Area of Impairment: Attention;Following commands;Awareness;Problem solving                   Current Attention Level: Sustained Memory: Decreased short-term memory;Decreased recall of precautions Following Commands: Follows one step commands inconsistently;Follows one step commands with increased time Safety/Judgement: Decreased awareness of safety;Decreased awareness of deficits Awareness: Intellectual Problem Solving: Slow processing;Requires verbal cues;Requires tactile cues;Decreased initiation;Difficulty sequencing General Comments: pt following approx 50% commands, require cueing to attend to R side of body and visual field; tries to mouth word and nod head during session; decreased impulsivity today       Exercises      General Comments General comments (skin integrity, edema, etc.): pt fixated on wanting his "drink" and reports feeling thirsty; when drinking pt has some of the liquid coming out of R nostril but no coughing or signs of distress when drinking--RN notified       Pertinent Vitals/Pain Pain Assessment:  Faces Faces Pain Scale: Hurts even more Pain Location: reports back, mostly with bed mobility Pain Descriptors / Indicators: Grimacing Pain Intervention(s): Limited activity within patient's tolerance;Monitored during session;Repositioned    Home Living   Living Arrangements: Spouse/significant other Available Help at Discharge: Family;Available 24  hours/day Type of Home: House Home Access: Stairs to enter Entrance Stairs-Rails: Right;Left Home Layout: One level        Prior Function Level of Independence: Independent with assistive device(s)      Comments: reports intermittent use of SPC   PT Goals (current goals can now be found in the care plan section) Acute Rehab PT Goals Patient Stated Goal: none stated Progress towards PT goals: Progressing toward goals    Frequency    Min 3X/week      PT Plan Current plan remains appropriate    Co-evaluation PT/OT/SLP Co-Evaluation/Treatment: Yes Reason for Co-Treatment: Complexity of the patient's impairments (multi-system involvement);For patient/therapist safety;To address functional/ADL transfers PT goals addressed during session: Mobility/safety with mobility;Balance;Strengthening/ROM OT goals addressed during session: ADL's and self-care      AM-PAC PT "6 Clicks" Mobility   Outcome Measure  Help needed turning from your back to your side while in a flat bed without using bedrails?: Total Help needed moving from lying on your back to sitting on the side of a flat bed without using bedrails?: Total Help needed moving to and from a bed to a chair (including a wheelchair)?: Total Help needed standing up from a chair using your arms (e.g., wheelchair or bedside chair)?: Total Help needed to walk in hospital room?: Total Help needed climbing 3-5 steps with a railing? : Total 6 Click Score: 6    End of Session Equipment Utilized During Treatment: Oxygen Activity Tolerance: Patient tolerated treatment well Patient left: in bed;with call bell/phone within reach;with bed alarm set Nurse Communication: Mobility status PT Visit Diagnosis: Unsteadiness on feet (R26.81);Other abnormalities of gait and mobility (R26.89);Muscle weakness (generalized) (M62.81);Other symptoms and signs involving the nervous system (Q73.419)     Time: 3790-2409 PT Time Calculation (min) (ACUTE  ONLY): 34 min  Charges:  $Therapeutic Activity: 8-22 mins                     Earney Navy, PTA Acute Rehabilitation Services Pager: [phone] Office: [phone]     Darliss Cheney 03/20/2019, 3:38 PM

## 2019-03-12 NOTE — Progress Notes (Signed)
Occupational Therapy Treatment Patient Details Name: David [name] MRN: [id number] DOB: 02/27/1932 Today's Date: 02/28/2019    History of present illness David [name] is an 85 y.o. 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  CT showing potential infarct L medial cerebellum   OT comments  Pt progressing towards OT goals, presents supine in bed, sleepy initially but agreeable to working with therapy. Pt continues to require heavy two person assist for bed mobility. Pt tolerates sitting EOB for most of session, fluctuating level of assist (up to maxA at times when fatigued) due to R lateral lean however pt is intermittently able to maintain static balance with close minguard assist. Pt following basic commands approx 50% of the time given intermittent cues and repetition. Completed self-feeding ADL (drinking ensure) while seated EOB using L hand to bring drink to mouth given setup assist and external assist for sitting balance. Feel pt remains appropriate for CIR level services at time of discharge. Will continue to follow acutely.   Follow Up Recommendations  CIR;Supervision/Assistance - 24 hour    Equipment Recommendations  Other (comment)(TBA)          Precautions / Restrictions Precautions Precautions: Fall Restrictions Weight Bearing Restrictions: No       Mobility Bed Mobility Overal bed mobility: Needs Assistance Bed Mobility: Supine to Sit;Sit to Supine     Supine to sit: +2 for physical assistance;Total assist;Max assist Sit to supine: +2 for physical assistance;Total assist   General bed mobility comments: pt assisting with bilat LE more this session; multimodal cues for sequencing and hand over hand assist for using bed rail; assist for all aspects  of bed mobility   Transfers                 General transfer comment: deferred due to safety/fatigue     Balance Overall balance assessment: Needs assistance Sitting-balance support: Single extremity supported;Feet supported;Bilateral upper extremity supported Sitting balance-Leahy Scale: Poor Sitting balance - Comments: worked on sitting balance EOB and at times pt is able to maintain sitting with min A; pt is able to correct to midline posturing with cues  Postural control: Right lateral lean                                 ADL either performed or assessed with clinical judgement   ADL Overall ADL's : Needs assistance/impaired Eating/Feeding: Set up;Supervision/ safety;Sitting Eating/Feeding Details (indicate cue type and reason): requires support for sitting balance; pt able to hold drink with L hand and bring to mouth, cues for slower pace as pt very thirsty; also noted liquid of what pt was drinking (strawberry ensure) was dripping from pt's nose after intake - RN made aware and RN present end of session to address                                          Vision       Perception     Praxis      Cognition Arousal/Alertness: Lethargic Behavior During Therapy: [facility] for tasks assessed/performed;Restless Overall Cognitive Status: Impaired/Different from baseline Area of Impairment: Attention;Following commands;Awareness;Problem solving  Current Attention Level: Sustained Memory: Decreased short-term memory;Decreased recall of precautions Following Commands: Follows one step commands inconsistently;Follows one step commands with increased time Safety/Judgement: Decreased awareness of safety;Decreased awareness of deficits Awareness: Intellectual Problem Solving: Slow processing;Requires verbal cues;Requires tactile cues;Decreased initiation;Difficulty sequencing General Comments: pt following approx 50% commands,  require cueing to attend to R side of body and visual field; tries to mouth word and nod head during session; decreased impulsivity today         Exercises     Shoulder Instructions       General Comments pt fixated on wanting his "drink" and reports feeling thirsty; when drinking pt has some of the liquid coming out of R nostril but no coughing or signs of distress when drinking--RN notified     Pertinent Vitals/ Pain       Pain Assessment: Faces Faces Pain Scale: Hurts even more Pain Location: reports back, mostly with bed mobility Pain Descriptors / Indicators: Grimacing Pain Intervention(s): Limited activity within patient's tolerance;Monitored during session;Repositioned  Home Living   Living Arrangements: Spouse/significant other Available Help at Discharge: Family;Available 24 hours/day Type of Home: House Home Access: Stairs to enter [employer] of Steps: 5 Entrance Stairs-Rails: Right;Left Home Layout: One level     Bathroom Shower/Tub: Occupational psychologist: Standard Bathroom Accessibility: Yes How Accessible: Accessible via walker        Lives With: Spouse    Prior Functioning/Environment Level of Independence: Independent with assistive device(s)        Comments: reports intermittent use of SPC   Frequency  Min 3X/week        Progress Toward Goals  OT Goals(current goals can now be found in the care plan section)  Progress towards OT goals: Progressing toward goals  Acute Rehab OT Goals Patient Stated Goal: none stated OT Goal Formulation: With patient Time For Goal Achievement: 03/20/19 Potential to Achieve Goals: Good  Plan Discharge plan remains appropriate;Frequency remains appropriate    Co-evaluation    PT/OT/SLP Co-Evaluation/Treatment: Yes Reason for Co-Treatment: Complexity of the patient's impairments (multi-system involvement) PT goals addressed during session: Mobility/safety with  mobility;Balance;Strengthening/ROM OT goals addressed during session: ADL's and self-care      AM-PAC OT "6 Clicks" Daily Activity     Outcome Measure   Help from another person eating meals?: A Little Help from another person taking care of personal grooming?: A Little Help from another person toileting, which includes using toliet, bedpan, or urinal?: A Lot Help from another person bathing (including washing, rinsing, drying)?: A Lot Help from another person to put on and taking off regular upper body clothing?: A Lot Help from another person to put on and taking off regular lower body clothing?: Total 6 Click Score: 13    End of Session Equipment Utilized During Treatment: Oxygen  OT Visit Diagnosis: Unsteadiness on feet (R26.81);Other abnormalities of gait and mobility (R26.89)   Activity Tolerance Patient tolerated treatment well   Patient Left in bed;with call bell/phone within reach;with bed alarm set;with nursing/sitter in room   Nurse Communication Mobility status        Time: 2706-2376 OT Time Calculation (min): 34 min  Charges: OT General Charges $OT Visit: 1 Visit OT Treatments $Self Care/Home Management : 8-22 mins  Lou Cal, [place] Pager [phone] Office [place] 03/16/2019, 4:00 PM

## 2019-03-12 NOTE — H&P (Signed)
Physical Medicine and Rehabilitation Admission H&P        Chief Complaint  Patient presents with   Code Stroke  Chief complaint:weakness   HPI: SALBADOR [name] is an 85 year old right-handed male history of diabetes mellitus, laryngeal cancer status post laryngectomy, prior right PCA infarction with right homonymous hemianopsia as well as cognitive impairment, atrial fibrillation not on anticoagulation due to bouts of hematuria. Per chart review patient lives with spouse. Independent with assistive device using a straight cane. One level home. Presented 03/05/2019 after being found down on the floor with right-sided weakness and facial droop. Blood pressure 184/182. CT of the head showed small area of acute infarction left medial cerebellum. CT angiogram of head and neck occlusion of the left posterior cerebral artery at the proximal P2 segment area left PICA occlusion. Patient did not receive TPA. MRI the brain showed multifocal bilateral posterior circulation acute ischemic infarctions involving the left thalamus both medial temporal lobes in the left cerebellum. No hemorrhage or mass effect. Echocardiogram with ejection fraction of 45% mild to moderate reduced systolic function. Inferior basal wall hypokinesis. Neurology service is consulted maintained on Eliquis after being cleared by urology services for noted history of hematuria. Urine culture greater than 100,000 Klebsiella oxytoca and completing a course of Rocephin.patient noted history of laryngeal cancer with laryngectomy noted fair amount of copious secretions latest chest x-ray showed patchy bibasilar opacities, no consolidation or pneumonia. Tolerating a regular diet. Therapy evaluations completed and patient was admitted for a comprehensive rehabilitation program   Review of Systems  Unable to perform ROS: Acuity of condition        Past Medical History:  Diagnosis Date   Cancer of neck ([place])     History of atrial fibrillation       Hyperkalemia      with Ramipril dose at 10 mg, K wnl at 5 mg. per day   Hyperlipidemia     Hypertension      Pulmonary   Larynx cancer ([place])     Right homonymous hemianopsia      [name] 12/09/2017   Stroke ([place]) 11/2017    peripheral vision loss both eyes/notes 12/09/2017   Type II diabetes mellitus ([place])     Urinary retention     Vertebral fracture, osteoporotic ([facility])           Past Surgical History:  Procedure Laterality Date   AMPUTATION FINGER / THUMB Right      distal amputation x4 fingers (not thumb)    CATARACT EXTRACTION W/ INTRAOCULAR LENS  IMPLANT, BILATERAL Bilateral 2007   DOPPLER ECHOCARDIOGRAPHY        Atrial fib EF 60%--01/18/1994, 12/02/02--atrial fib, EF 50-55   DOPPLER ECHOCARDIOGRAPHY        ra,la,lv,mild dilat.,trace mitral regurg., mild pulm. htn   PILONIDAL CYST EXCISION       RADICAL NECK DISSECTION Left ~ [place]   ~1993    with left radical neck dissection         Family History  Problem Relation Age of Onset   Cirrhosis Father     Colon cancer Neg Hx     Prostate cancer Neg Hx      Social History:  reports that he quit smoking about 27 years ago. He has never used smokeless tobacco. He reports that he does not drink alcohol or use drugs. Allergies:       Allergies  Allergen Reactions   Actos [Pioglitazone] Other (See Comments)  Hematuria (Blood in the urine) STOPPED BY MD   Eliquis [Apixaban] Other (See Comments)      Hematuria (Blood in the urine) STOPPED BY MD          Medications Prior to Admission  Medication Sig Dispense Refill   acetaminophen (TYLENOL) 500 MG tablet Take 500 mg by mouth every 6 (six) hours as needed for mild pain or headache.        diphenhydrAMINE (BENADRYL) 25 mg capsule Take 25 mg by mouth every 8 (eight) hours as needed for itching or allergies.        diphenhydramine-acetaminophen (TYLENOL PM) 25-500 MG TABS tablet Take 1 tablet by mouth at bedtime as needed (for sleep  or pain).       hydrochlorothiazide (HYDRODIURIL) 12.5 MG tablet Take 1 tablet (12.5 mg total) by mouth daily. 90 tablet 3   metFORMIN (GLUCOPHAGE) 500 MG tablet Take 1 tablet (500 mg total) by mouth 2 (two) times daily with a meal. 180 tablet 3   oxyCODONE-acetaminophen (PERCOCET) 5-325 MG tablet Take 1 tablet by mouth daily as needed (for back pain).       ramipril (ALTACE) 5 MG capsule Take 1 capsule (5 mg total) by mouth daily. 90 capsule 3   simvastatin (ZOCOR) 40 MG tablet Take 1 tablet (40 mg total) by mouth at bedtime. 90 tablet 3   tamsulosin (FLOMAX) 0.4 MG CAPS capsule Take 1 capsule (0.4 mg total) by mouth daily. (Patient taking differently: Take 0.4 mg by mouth 2 (two) times daily. ) 90 capsule 3      Drug Regimen Review Drug regimen was reviewed and remains appropriate with no significant issues identified   Home: Home Living Family/patient expects to be discharged to:: Private residence Living Arrangements: Spouse/significant other Available Help at Discharge: Available 24 hours/day Type of Home: House Home Access: Stairs to enter [employer] of Steps: several Entrance Stairs-Rails: Right, Left Home Layout: One level Bathroom Shower/Tub: Multimedia programmer: Standard  Lives With: Spouse   Functional History: Prior Function Level of Independence: Independent, Independent with assistive device(s) Comments: reports intermittent use of SPC   Functional Status:  Mobility: Bed Mobility Overal bed mobility: Needs Assistance Bed Mobility: Supine to Sit, Sit to Supine Supine to sit: +2 for physical assistance, Total assist Sit to supine: +2 for physical assistance, Total assist General bed mobility comments: multimodal cues for sequencing and hand over hand assist for using bed rail; assist for all aspects of bed mobility  Transfers Overall transfer level: Needs assistance Equipment used: 2 person hand held assist Transfers: Sit to/from  Stand Sit to Stand: +2 physical assistance, Max assist Stand pivot transfers: Mod assist, +2 physical assistance, +2 safety/equipment General transfer comment: deferred due to safety/fatigue  Ambulation/Gait Ambulation/Gait assistance: Mod assist Gait Distance (Feet): 15 Feet(to toilet then additional 40 feet) Assistive device: IV Pole, 1 person hand held assist Gait Pattern/deviations: Step-through pattern General Gait Details: unable to get pt upright enough to safely ambulate Gait velocity interpretation: <1.31 ft/sec, indicative of household ambulator   ADL: ADL Overall ADL's : Needs assistance/impaired Eating/Feeding: Minimal assistance, Sitting Grooming: Wash/dry face, Sitting, Minimal assistance, Oral care Grooming Details (indicate cue type and reason): oral care with min assist using suction swab, requires assist to initate washing face but able to complete task, perseverates on task  Upper Body Bathing: Minimal assistance, Sitting Lower Body Bathing: Moderate assistance, +2 for physical assistance, +2 for safety/equipment, Sit to/from stand Upper Body Dressing : Sitting, Minimal assistance Lower  Body Dressing: +2 for physical assistance, Maximal assistance, +2 for safety/equipment, Sit to/from stand Lower Body Dressing Details (indicate cue type and reason): modA +2 for standing balance Toilet Transfer: Moderate assistance, +2 for physical assistance, +2 for safety/equipment, Stand-pivot Toilet Transfer Details (indicate cue type and reason): deferred due to safety/fatigue Toileting- Clothing Manipulation and Hygiene: Maximal assistance, +2 for physical assistance, +2 for safety/equipment, Sit to/from stand Functional mobility during ADLs: +2 for physical assistance, +2 for safety/equipment, Total assistance(limited to EOB) General ADL Comments: pt continues to be limited by cognition, coordination, balance and act tolerance   Cognition: Cognition Overall Cognitive Status:  Impaired/Different from baseline Arousal/Alertness: Awake/alert Orientation Level: Oriented to person Attention: Focused, Sustained Focused Attention: Appears intact Sustained Attention: Appears intact Awareness: Impaired Awareness Impairment: Intellectual impairment Problem Solving: Impaired Problem Solving Impairment: Verbal complex(1/3) Cognition Arousal/Alertness: Lethargic Behavior During Therapy: WFL for tasks assessed/performed, Restless Overall Cognitive Status: Impaired/Different from baseline Area of Impairment: Attention, Following commands, Awareness, Problem solving Current Attention Level: Sustained Memory: Decreased short-term memory, Decreased recall of precautions Following Commands: Follows one step commands inconsistently, Follows one step commands with increased time Safety/Judgement: Decreased awareness of safety, Decreased awareness of deficits Awareness: Intellectual Problem Solving: Slow processing, Requires verbal cues, Requires tactile cues, Decreased initiation, Difficulty sequencing General Comments: pt following approx 50% commands, require cueing to attend to R side of body and visual field; tries to mouth word and nod head during session; decreased impulsivity today;difficult to assess due to trach    Physical Exam: Blood pressure 118/75, pulse 78, temperature 97.6 F (36.4 C), temperature source Oral, resp. rate 18, height 6\' 3"  (1.905 m), weight 104 kg, SpO2 98 %. Physical Exam  Neurological:  Patient is alert restless bilateral mittens in place he does make eye contact with examiner. Follows simple commands- inconsistently. He did attempt to mouth some words   HEENT- tracheostomy without tube General: No acute distress Mood and affect are appropriate Heart: irregular rate and rhythm no rubs murmurs or extra sounds Lungs: Clear to auscultation, breathing unlabored, no rales or wheezes Abdomen: Positive bowel sounds, soft nontender to palpation,  nondistended Extremities: No clubbing, cyanosis, or edema Skin: No evidence of breakdown, no evidence of rash Neurologic: Cranial nerves II through XII intact, motor strength is4/5 inleft and 3/5 Right  deltoid, bicep, tricep, grip,4/5 Left and trace Right  hip flexor, knee extensors, ankle dorsiflexor and plantar flexor- poorly cooperative with MMT  Sensory examno withdrawal to pinch in RLE cannot perform sensory testing due to communciation trach +/- aphasia Cerebellar exam could not cooperate Musculoskeletal:No joint swelling in extremities   Lab Results Last 48 Hours        Results for orders placed or performed during the hospital encounter of 03/05/19 (from the past 48 hour(s))  Glucose, capillary     Status: Abnormal    Collection Time: 03/10/19 11:14 AM  Result Value Ref Range    Glucose-Capillary 168 (H) 70 - 99 mg/dL  Glucose, capillary     Status: Abnormal    Collection Time: 03/10/19  3:40 PM  Result Value Ref Range    Glucose-Capillary 154 (H) 70 - 99 mg/dL  Glucose, capillary     Status: Abnormal    Collection Time: 03/10/19  9:05 PM  Result Value Ref Range    Glucose-Capillary 113 (H) 70 - 99 mg/dL    Comment 1 Notify RN      Comment 2 Document in Chart    Basic metabolic panel  Status: Abnormal    Collection Time: 03/11/19  3:50 AM  Result Value Ref Range    Sodium 142 135 - 145 mmol/L    Potassium 4.3 3.5 - 5.1 mmol/L    Chloride 109 98 - 111 mmol/L    CO2 22 22 - 32 mmol/L    Glucose, Bld 132 (H) 70 - 99 mg/dL    BUN 39 (H) 8 - 23 mg/dL    Creatinine, Ser 1.19 0.61 - 1.24 mg/dL    Calcium 8.9 8.9 - 10.3 mg/dL    GFR calc non Af Amer 55 (L) >60 mL/min    GFR calc Af Amer >60 >60 mL/min    Anion gap 11 5 - 15      Comment: Performed at [facility] [address], [place], [place] [address]  CBC     Status: Abnormal    Collection Time: 03/11/19  3:50 AM  Result Value Ref Range    WBC 10.2 4.0 - 10.5 K/uL    RBC 3.90 (L) 4.22 - 5.81 MIL/uL     Hemoglobin 12.5 (L) 13.0 - 17.0 g/dL    HCT 39.8 39.0 - 52.0 %    MCV 102.1 (H) 80.0 - 100.0 fL    MCH 32.1 26.0 - 34.0 pg    MCHC 31.4 30.0 - 36.0 g/dL    RDW 14.8 11.5 - 15.5 %    Platelets 179 150 - 400 K/uL    nRBC 0.0 0.0 - 0.2 %      Comment: Performed at [facility], [place] [address] [place], [place] [address]  Vitamin B12     Status: Abnormal    Collection Time: 03/11/19  3:50 AM  Result Value Ref Range    Vitamin B-12 1,386 (H) 180 - 914 pg/mL      Comment: (NOTE) This assay is not validated for testing neonatal or myeloproliferative syndrome specimens for Vitamin B12 levels. Performed at [facility], [place] [address], [place], [place] [address]    Glucose, capillary     Status: Abnormal    Collection Time: 03/11/19  6:09 AM  Result Value Ref Range    Glucose-Capillary 128 (H) 70 - 99 mg/dL    Comment 1 Notify RN      Comment 2 Document in Chart    Glucose, capillary     Status: Abnormal    Collection Time: 03/11/19 11:23 AM  Result Value Ref Range    Glucose-Capillary 191 (H) 70 - 99 mg/dL  Glucose, capillary     Status: Abnormal    Collection Time: 03/11/19  4:30 PM  Result Value Ref Range    Glucose-Capillary 162 (H) 70 - 99 mg/dL  Glucose, capillary     Status: Abnormal    Collection Time: 03/11/19  9:13 PM  Result Value Ref Range    Glucose-Capillary 142 (H) 70 - 99 mg/dL  Glucose, capillary     Status: Abnormal    Collection Time: 03/20/2019  6:37 AM  Result Value Ref Range    Glucose-Capillary 114 (H) 70 - 99 mg/dL      Imaging Results (Last 48 hours)  No results found.           Medical Problem List and Plan: 1.  Decreased functional mobility, right homonymous hemianopsia as well as cognitive impairments secondary to bilateral posterior circulation infarctions as well as history of CVA Rehab evals in am 2.  Antithrombotics: -DVT/anticoagulation:  Eliquis- monitor for anemia             -  antiplatelet therapy: N/A  3. Pain Management:   Tylenol as needed 4. Mood:  Provide emotional support             -antipsychotic agents: Seroquel 25 mg daily at bedtime 5. Neuropsych: This patient is not capable of making decisions on his own behalf. 6. Skin/Wound Care:  Routine skin checks 7. Fluids/Electrolytes/Nutrition:  Routine in and out's with follow-up chemistries 8. History of atrial fibrillation. Cardiac rate controlled. Continue Eliquis 9. Hematuria/UTI. Followed by urology services Dr.Dahlstedt. Continue Flomax 0.4 mg twice daily. Complete course of Rocephin for UTI 10. Hypertension. Lopressor 12.5 mg twice a day. Monitor with increased mobility 11. Type 2 diabetes mellitus. Sliding scale insulin. Check blood sugars before meals and at bedtime. Patient on Glucophage 500 mg twice daily prior to admission. Resume as needed 12. Hyperlipidemia. Zocor 13. History of laryngeal cancer. Status post laryngectomy. Monitor for any increased secretions     Post Admission Physician Evaluation: 1. Functional deficits secondary  to RIght hemiparesis Left PCA and PICA distribution infarct. 2. Patient admitted to receive collaborative, interdisciplinary care between the physiatrist, rehab nursing staff, and therapy team. 3. Patient's level of medical complexity and substantial therapy needs in context of that medical necessity cannot be provided at a lesser intensity of care. 4. Patient has experienced substantial functional loss from his/her baseline..  Judging by the patient's diagnosis, physical exam, and functional history, the patient has potential for functional progress which will result in measurable gains while on inpatient rehab.  These gains will be of substantial and practical use upon discharge in facilitating mobility and self-care at the household level. 5. Physiatrist will provide 24 hour management of medical needs as well as oversight of the therapy plan/treatment and provide guidance as appropriate regarding the interaction of the  two. 6. 24 hour rehab nursing will assist in the management of  bladder management, bowel management, safety, skin/wound care, disease management, medication administration, pain management and patient education  and help integrate therapy concepts, techniques,education, etc. 7. PT will assess and treat for:pre gait, gait training, endurance , safety, equipment, neuromuscular re education  .  Goals are: minimal assist. 8. OT will assess and treat for ADLs, Cognitive perceptual skills, Neuromuscular re education, safety, endurance, equipment  .  Goals are: minimal assist.  9. SLP will assess and treat for attention to RIght , aphasia, non verbal communicati9n  .  Goals are: moderate assist. 10. Case Management and Social Worker will assess and treat for psychological issues and discharge planning. 11. Team conference will be held weekly to assess progress toward goals and to determine barriers to discharge. 12.  Patient will receive at least 3 hours of therapy per day at least 5 days per week. 13. ELOS and Prognosis: 20-27d fair  "I have personally performed a face to face diagnostic evaluation of this patient.  Additionally, I have reviewed and concur with the physician assistant's documentation above." Charlett Blake M.D. [facility] FAAPM&R (Sports Med, Neuromuscular Med) Diplomate Am Board of Electrodiagnostic Med    Elizabeth Sauer 03/20/2019

## 2019-03-12 NOTE — Progress Notes (Signed)
Michel Santee, PT  Rehab Admission Coordinator  Physical Medicine and Rehabilitation  PMR Pre-admission  Signed  Date of Service:  03/22/2019 3:09 PM       Related encounter: ED to Hosp-Admission (Current) from 03/05/2019 in [facility]      Signed         Show:Clear all '[x]' Manual'[x]' Template'[x]' Copied  Added by: '[x]' Michel Santee, PT  '[]' Hover for details PMR Admission Coordinator Pre-Admission Assessment  Patient: David [name] is an 86 y.o., male MRN: [id number] DOB: 05-Mar-1932 Height: '6\' 3"'  (190.5 cm) Weight: 104 kg                                                                                                                                                  Insurance Information HMO: yes    PPO:      PCP:      IPA:      80/20:      OTHER:  PRIMARY: BCBS Medicare      Policy#: [id number]      Subscriber: patient CM Name: Cedric      Phone#: [phone]     Fax#: [phone] Pre-Cert#: [id number]      Employer:  Benefits:  Phone #: [phone]     Name:  Eff. Date: 11/26/2018     Deduct: $0      Out of Pocket Max: $3900 (met $139.95)      Life Max: n/a CIR: $310/day 1-6, $0/day 7+      SNF: $0/day 1-20, $178/day 21-60, $0/day 61-100 Outpatient: $40/visit     Co-Pay:  Home Health: 100%      Co-Pay:  DME: 80%     Co-Pay: 20%  SECONDARY:       Policy#:       Subscriber:  CM Name:       Phone#:      Fax#:  Pre-Cert#:       Employer:  Benefits:  Phone #:      Name:  Eff. Date:      Deduct:       Out of Pocket Max:       Life Max:  CIR:       SNF:  Outpatient:      Co-Pay:  Home Health:       Co-Pay:  DME:      Co-Pay:   Medicaid Application Date:       Case Manager:  Disability Application Date:       Case Worker:   The "Data Collection Information Summary" for patients in Inpatient Rehabilitation Facilities with attached "Privacy Act [place] Records" was provided and verbally reviewed with: Family  Emergency Contact  Information         Contact Information    Name Relation Home Work Mobile   [place] L Spouse [phone]  Current Medical History  Patient Admitting Diagnosis: B CVA  History of Present Illness: David [name] is an 85 year old right-handed male history of diabetes mellitus, laryngeal cancer status post laryngectomy, prior right PCA infarction with right homonymous hemianopsia as well as cognitive impairment, atrial fibrillation not on anticoagulation due to bouts of hematuria. Presented 03/05/2019 after being found down on the floor with right-sided weakness and facial droop. Blood pressure 184/182. CT of the head showed small area of acute infarction left medial cerebellum. CT angiogram of head and neck occlusion of the left posterior cerebral artery at the proximal P2 segment area left PICA occlusion. Patient did not receive TPA. MRI the brain showed multifocal bilateral posterior circulation acute ischemic infarctions involving the left thalamus both medial temporal lobes in the left cerebellum. No hemorrhage or mass effect. Echocardiogram with ejection fraction of 45% mild to moderate reduced systolic function. Inferior basal wall hypokinesis. Neurology service is consulted maintained on Eliquis after being cleared by urology services for noted history of hematuria. Urine culture greater than 100,000 Klebsiella oxytoca and completing a course of Rocephin.patient noted history of laryngeal cancer with laryngectomy noted fair amount of copious secretions latest chest x-ray showed patchy bibasilar opacities, no consolidation or pneumonia. Tolerating a regular diet.   Complete NIHSS TOTAL: 13  Past Medical History      Past Medical History:  Diagnosis Date  . Cancer of neck ([place])   . History of atrial fibrillation   . Hyperkalemia    with Ramipril dose at 10 mg, K wnl at 5 mg. per day  . Hyperlipidemia   . Hypertension    Pulmonary  . Larynx cancer ([place])   .  Right homonymous hemianopsia    [name] 12/09/2017  . Stroke ([place]) 11/2017   peripheral vision loss both eyes/notes 12/09/2017  . Type II diabetes mellitus ([place])   . Urinary retention   . Vertebral fracture, osteoporotic ([facility])     Family History  family history includes Cirrhosis in his father.  Prior Rehab/Hospitalizations:  Has the patient had prior rehab or hospitalizations prior to admission? No  Has the patient had major surgery during 100 days prior to admission? No  Current Medications   Current Facility-Administered Medications:  .   stroke: mapping our early stages of recovery book, , Does not apply, Once, Aroor, Karena Addison R, MD .  0.9 %  sodium chloride infusion, , Intravenous, Continuous, Rosalin Hawking, MD, Last Rate: 40 mL/hr at 03/11/19 0728 .  acetaminophen (TYLENOL) tablet 650 mg, 650 mg, Oral, Q4H PRN **OR** acetaminophen (TYLENOL) solution 650 mg, 650 mg, Per Tube, Q4H PRN **OR** acetaminophen (TYLENOL) suppository 650 mg, 650 mg, Rectal, Q4H PRN, Aroor, Karena Addison R, MD .  apixaban (ELIQUIS) tablet 5 mg, 5 mg, Oral, BID, Skeet Simmer, RPH, 5 mg at 03/04/2019 2229 .  cefTRIAXone (ROCEPHIN) 1 g in sodium chloride 0.9 % 100 mL IVPB, 1 g, Intravenous, Q24H, Biby, Sharon L, NP, Last Rate: 200 mL/hr at 03/11/19 1441, 1 g at 03/11/19 1441 .  feeding supplement (ENSURE ENLIVE) (ENSURE ENLIVE) liquid 237 mL, 237 mL, Oral, BID BM, Leonie Man, Pramod S, MD, 237 mL at 03/11/19 1441 .  haloperidol lactate (HALDOL) injection 2 mg, 2 mg, Intravenous, Q8H PRN, Rosalin Hawking, MD, 2 mg at 03/09/19 0159 .  insulin aspart (novoLOG) injection 0-9 Units, 0-9 Units, Subcutaneous, TID WC, Rosalin Hawking, MD, 2 Units at 02/27/2019 1208 .  metoprolol tartrate (LOPRESSOR) tablet 12.5 mg, 12.5 mg, Oral, BID, Rosalin Hawking, MD, 12.5 mg  at 03/16/2019 0907 .  pantoprazole (PROTONIX) EC tablet 40 mg, 40 mg, Oral, Daily, Rosalin Hawking, MD, 40 mg at 03/15/2019 0907 .  QUEtiapine (SEROQUEL) tablet 25 mg, 25 mg, Oral,  QHS, Garvin Fila, MD, 25 mg at 03/11/19 2150 .  senna-docusate (Senokot-S) tablet 1 tablet, 1 tablet, Oral, QHS PRN, Aroor, Karena Addison R, MD .  simvastatin (ZOCOR) tablet 20 mg, 20 mg, Oral, QHS, Biby, Sharon L, NP, 20 mg at 03/11/19 2150 .  tamsulosin (FLOMAX) capsule 0.4 mg, 0.4 mg, Oral, BID, Rosalin Hawking, MD, 0.4 mg at 03/24/2019 0907  Patients Current Diet:     Diet Order                  Diet heart healthy/carb modified Room service appropriate? No; Fluid consistency: Thin  Diet effective now               Precautions / Restrictions Precautions Precautions: Fall Restrictions Weight Bearing Restrictions: No   Has the patient had 2 or more falls or a fall with injury in the past year?Yes  Prior Activity Level Community (5-7x/wk): very independent up until fall in July, has been more limited by back pain since that time  Prior Functional Level Prior Function Level of Independence: Independent with assistive device(s) Comments: reports intermittent use of SPC  Self Care: Did the patient need help bathing, dressing, using the toilet or eating?  Independent  Indoor Mobility: Did the patient need assistance with walking from room to room (with or without device)? Independent  Stairs: Did the patient need assistance with internal or external stairs (with or without device)? Independent  Functional Cognition: Did the patient need help planning regular tasks such as shopping or remembering to take medications? Independent  Home Assistive Devices / Equipment Home Assistive Devices/Equipment: Bedside commode/3-in-1, Cane (specify quad or straight)  Prior Device Use: Indicate devices/aids used by the patient prior to current illness, exacerbation or injury? cane  Current Functional Level Cognition  Arousal/Alertness: Awake/alert Overall Cognitive Status: Impaired/Different from baseline Current Attention Level: Sustained Orientation Level: Oriented to  person, Disoriented to place, Disoriented to situation, Disoriented to time Following Commands: Follows one step commands inconsistently, Follows one step commands with increased time Safety/Judgement: Decreased awareness of safety, Decreased awareness of deficits General Comments: pt following approx 50% commands, require cueing to attend to R side of body and visual field; tries to mouth word and nod head during session; decreased impulsivity today  Attention: Focused, Sustained Focused Attention: Appears intact Sustained Attention: Appears intact Awareness: Impaired Awareness Impairment: Intellectual impairment Problem Solving: Impaired Problem Solving Impairment: Verbal complex(1/3)    Extremity Assessment (includes Sensation/Coordination)  Upper Extremity Assessment: RUE deficits/detail RUE Deficits / Details: some ataxic movements noted; discoordination; pt with amputated digits (baseline)   RUE Coordination: decreased fine motor  Lower Extremity Assessment: Defer to PT evaluation RLE Deficits / Details: No overt focal weaknesses, hip flexors 2+/3-/5, quad 4-, hams >4, df/pf >3/5 RLE Coordination: decreased fine motor, decreased gross motor LLE Deficits / Details: hip flexor weakest at 2+/3_/5, o/w 4/5  L more coordinated than Right, but some incoordination.    ADLs  Overall ADL's : Needs assistance/impaired Eating/Feeding: Set up, Supervision/ safety, Sitting Eating/Feeding Details (indicate cue type and reason): requires support for sitting Grooming: Wash/dry face, Sitting, Minimal assistance, Oral care Grooming Details (indicate cue type and reason): oral care with min assist using suction swab, requires assist to initate washing face but able to complete task, perseverates on task  Upper  Body Bathing: Minimal assistance, Sitting Lower Body Bathing: Moderate assistance, +2 for physical assistance, +2 for safety/equipment, Sit to/from stand Upper Body Dressing : Sitting,  Minimal assistance Lower Body Dressing: +2 for physical assistance, Maximal assistance, +2 for safety/equipment, Sit to/from stand Lower Body Dressing Details (indicate cue type and reason): modA +2 for standing balance Toilet Transfer: Moderate assistance, +2 for physical assistance, +2 for safety/equipment, Stand-pivot Toilet Transfer Details (indicate cue type and reason): deferred due to safety/fatigue Toileting- Clothing Manipulation and Hygiene: Maximal assistance, +2 for physical assistance, +2 for safety/equipment, Sit to/from stand Functional mobility during ADLs: +2 for physical assistance, +2 for safety/equipment, Total assistance(limited to EOB) General ADL Comments: pt continues to be limited by cognition, coordination, balance and act tolerance    Mobility  Overal bed mobility: Needs Assistance Bed Mobility: Supine to Sit, Sit to Supine Supine to sit: +2 for physical assistance, Total assist Sit to supine: +2 for physical assistance, Total assist General bed mobility comments: multimodal cues for sequencing and hand over hand assist for using bed rail; assist for all aspects of bed mobility     Transfers  Overall transfer level: Needs assistance Equipment used: 2 person hand held assist Transfers: Sit to/from Stand Sit to Stand: +2 physical assistance, Max assist Stand pivot transfers: Mod assist, +2 physical assistance, +2 safety/equipment General transfer comment: deferred due to safety/fatigue     Ambulation / Gait / Stairs / Wheelchair Mobility  Ambulation/Gait Ambulation/Gait assistance: Mod assist Gait Distance (Feet): 15 Feet(to toilet then additional 40 feet) Assistive device: IV Pole, 1 person hand held assist Gait Pattern/deviations: Step-through pattern General Gait Details: unable to get pt upright enough to safely ambulate Gait velocity interpretation: <1.31 ft/sec, indicative of household ambulator    Posture / Balance Dynamic Sitting Balance  Sitting balance - Comments: sitting EOB with mod-minA (at best) while engaging in functional ADLs using L UE  Balance Overall balance assessment: Needs assistance Sitting-balance support: Single extremity supported, Feet supported, Bilateral upper extremity supported Sitting balance-Leahy Scale: Poor Sitting balance - Comments: sitting EOB with mod-minA (at best) while engaging in functional ADLs using L UE  Postural control: Right lateral lean Standing balance-Leahy Scale: Zero Standing balance comment: needs external support    Special needs/care consideration BiPAP/CPAP no CPM no Continuous Drip IV Rocephin Dialysis no        Days n/a Life Vest no Oxygen 5L per min via trach collar Special Bed no Trach Size n/a Wound Vac (area) no      Location n/a Skin ecchymosis to BUEs, skin tear to buttocks and BUEs                               Bowel mgmt: incontinent, last BM 03/09/2019 Bladder mgmt: incontintent, foley Diabetic mgmt yes, oral medications Behavioral consideration  no Chemo/radiation  no     Previous Home Environment (from acute therapy documentation) Living Arrangements: Spouse/significant other  Lives With: Spouse Available Help at Discharge: Family, Available 24 hours/day Type of Home: House Home Layout: One level Home Access: Stairs to enter Entrance Stairs-Rails: Right, Left Entrance Stairs-Number of Steps: 5 Bathroom Shower/Tub: Multimedia programmer: Standard Bathroom Accessibility: Yes How Accessible: Accessible via walker  Discharge Living Setting Plans for Discharge Living Setting: Patient's home Type of Home at Discharge: House Discharge Home Layout: One level Discharge Home Access: Stairs to enter Entrance Stairs-Rails: Can reach both Entrance Stairs-Number of Steps: 5(wife plans to have  ramp built) Discharge Bathroom Shower/Tub: Walk-in shower Discharge Bathroom Toilet: Standard Discharge Bathroom Accessibility: Yes How  Accessible: Accessible via walker Does the patient have any problems obtaining your medications?: No  Social/Family/Support Systems Patient Roles: Spouse Anticipated Caregiver: wife Bertram Millard, and hired caregivers if needed Anticipated Ambulance person Information: Bertram Millard [phone] Ability/Limitations of Caregiver: has a bad back, can only provide supervision Caregiver Availability: 24/7 Discharge Plan Discussed with Primary Caregiver: Yes Is Caregiver In Agreement with Plan?: Yes Does Caregiver/Family have Issues with Lodging/Transportation while Pt is in Rehab?: No   Goals/Additional Needs Patient/Family Goal for Rehab: PT/OT/SLP, min to mod assist Expected length of stay: 20-27 days Cultural Considerations: none Dietary Needs: heart healthy/carb modified, thin Equipment Needs: tbd Pt/Family Agrees to Admission and willing to participate: Yes Program Orientation Provided & Reviewed with Pt/Caregiver Including Roles  & Responsibilities: Yes   Possible need for SNF placement upon discharge: not anticipated   Patient Condition: This patient's medical and functional status has changed since the consult dated: 03/09/2019 in which the Rehabilitation Physician determined and documented that the patient's condition is appropriate for intensive rehabilitative care in an inpatient rehabilitation facility. See "History of Present Illness" (above) for medical update. Functional changes are: max/total assist. Patient's medical and functional status update has been discussed with the Rehabilitation physician and patient remains appropriate for inpatient rehabilitation. Will admit to inpatient rehab today.  Preadmission Screen Completed By:  Michel Santee, PT, 03/18/2019 3:16 PM ______________________________________________________________________   Discussed status with Dr. Letta Pate on 03/07/2019 at 3:16 PM  and received approval for admission today.  Admission Coordinator:  Michel Santee, time 3:16 PM Sudie Grumbling 03/13/2019          Cosigned by: Charlett Blake, MD at 03/15/2019 3:54 PM  Revision History

## 2019-03-12 NOTE — Progress Notes (Signed)
[name], MD  Physician  Physical Medicine and Rehabilitation  Consult Note  Signed  Date of Service:  03/09/2019 6:29 AM       Related encounter: ED to Hosp-Admission (Current) from 03/05/2019 in [facility]      Signed      Expand All Collapse All    Show:Clear all [x] Manual[x] Template[] Copied  Added by: [x] Angiulli, Lavon Paganini, PA-C[x] [name], MD  [] Hover for details      Physical Medicine and Rehabilitation Consult Reason for Consult: decreased functional mobility Referring Physician: Dr.Xu   HPI: David [name] is a 85 y.o.right handed male with history of diabetes mellitus,laryngeal cancer status post laryngectomy, prior right PCA infarction with right homonymous hemianopsia as well as cognitive impairment, atrial fibrillation not on anticoagulation due to bouts of hematuria. Per chart review patient lives with spouse. Independent with assistive device using a straight point cane. One level home. Presented 03/05/2019 after being found on the floor with right-sided weakness and facial droop. Blood pressure 184/82. CT of the head showed small area of acute infarction left medial cerebellum. CT angiogram of head and neck occlusion of the left posterior cerebral artery at the proximal P2 segment. Left PICA occlusion. Patient did receive TPA. MRI the brain multifocal bilateral posterior circulation acute ischemic infarctions including the left thalamus both medial temporal lobes in the left cerebellum. No hemorrhage or mass effect. Echocardiogram with ejection fraction of 45%. Mild to moderately reduced systolic function.Inferior basal wall hypokinesis. Neurology consulted presently on Eliquis for CVA prophylaxis after being cleared by urology services for noted history of hematuria. Currently maintained on Rocephin for suspected UTI WBC 11-20. Patient remains with chronic tracheostomy tube noted bouts of copious secretions and latest chest  x-ray patchy bibasal or opacities solid base and or pneumonia. Patient is currently on a regular consistency diet. Therapy evaluations completed with recommendations of physical medicine rehabilitation consult.   Review of Systems  Unable to perform ROS: Acuity of condition       Past Medical History:  Diagnosis Date   Cancer of neck ([place])    History of atrial fibrillation    Hyperkalemia    with Ramipril dose at 10 mg, K wnl at 5 mg. 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[place])    Right homonymous hemianopsia    [name] 12/09/2017   Stroke ([place]) 11/2017   peripheral vision loss both eyes/notes 12/09/2017   Type II diabetes mellitus ([place])    Urinary retention    Vertebral fracture, osteoporotic ([facility])         Past Surgical History:  Procedure Laterality Date   AMPUTATION FINGER / THUMB Right    distal amputation x4 fingers (not thumb)    CATARACT EXTRACTION W/ INTRAOCULAR LENS  IMPLANT, BILATERAL Bilateral 2007   DOPPLER ECHOCARDIOGRAPHY     Atrial fib EF 60%--01/18/1994, 12/02/02--atrial fib, EF 50-55   DOPPLER ECHOCARDIOGRAPHY     ra,la,lv,mild dilat.,trace mitral regurg., mild pulm. htn   PILONIDAL CYST EXCISION     RADICAL NECK DISSECTION Left ~ [place]  ~1993   with left radical neck dissection        Family History  Problem Relation Age of Onset   Cirrhosis Father    Colon cancer Neg Hx    Prostate cancer Neg Hx    Social History:  reports that he quit smoking about 27 years ago. He has never  used smokeless tobacco. He reports that he does not drink alcohol or use drugs. Allergies:       Allergies  Allergen Reactions   Actos [Pioglitazone] Other (See Comments)    Hematuria (Blood in the urine) STOPPED BY MD   Eliquis [Apixaban] Other (See Comments)    Hematuria (Blood in the urine) STOPPED BY MD         Medications Prior to Admission  Medication Sig  Dispense Refill   acetaminophen (TYLENOL) 500 MG tablet Take 500 mg by mouth every 6 (six) hours as needed for mild pain or headache.      diphenhydrAMINE (BENADRYL) 25 mg capsule Take 25 mg by mouth every 8 (eight) hours as needed for itching or allergies.      diphenhydramine-acetaminophen (TYLENOL PM) 25-500 MG TABS tablet Take 1 tablet by mouth at bedtime as needed (for sleep or pain).     hydrochlorothiazide (HYDRODIURIL) 12.5 MG tablet Take 1 tablet (12.5 mg total) by mouth daily. 90 tablet 3   metFORMIN (GLUCOPHAGE) 500 MG tablet Take 1 tablet (500 mg total) by mouth 2 (two) times daily with a meal. 180 tablet 3   oxyCODONE-acetaminophen (PERCOCET) 5-325 MG tablet Take 1 tablet by mouth daily as needed (for back pain).     ramipril (ALTACE) 5 MG capsule Take 1 capsule (5 mg total) by mouth daily. 90 capsule 3   simvastatin (ZOCOR) 40 MG tablet Take 1 tablet (40 mg total) by mouth at bedtime. 90 tablet 3   tamsulosin (FLOMAX) 0.4 MG CAPS capsule Take 1 capsule (0.4 mg total) by mouth daily. (Patient taking differently: Take 0.4 mg by mouth 2 (two) times daily. ) 90 capsule 3    Home: Home Living Family/patient expects to be discharged to:: Private residence Living Arrangements: Spouse/significant other Available Help at Discharge: Available 24 hours/day Type of Home: House Home Access: Stairs to enter [employer] of Steps: several Entrance Stairs-Rails: Right, Left Home Layout: One level Bathroom Shower/Tub: Multimedia programmer: Standard  Lives With: Spouse  Functional History: Prior Function Level of Independence: Independent, Independent with assistive device(s) Comments: reports intermittent use of SPC Functional Status:  Mobility: Bed Mobility Overal bed mobility: Needs Assistance Bed Mobility: Sit to Supine Supine to sit: Mod assist Sit to supine: Max assist, +2 for physical assistance General bed mobility comments: pt didn't  follow to directional cues as well today.  need more assist in general and more 2nd person assist Transfers Overall transfer level: Needs assistance Equipment used: Rolling walker (2 wheeled), 2 person hand held assist Transfers: Sit to/from Stand Sit to Stand: Max assist, +2 physical assistance Stand pivot transfers: Mod assist, +2 physical assistance, +2 safety/equipment General transfer comment: pt started pushing from left to right within session, increasing assistance needs Ambulation/Gait Ambulation/Gait assistance: Mod assist Gait Distance (Feet): 15 Feet(to toilet then additional 40 feet) Assistive device: IV Pole, 1 person hand held assist Gait Pattern/deviations: Step-through pattern General Gait Details: unable to get pt upright enough to safely ambulate Gait velocity interpretation: <1.31 ft/sec, indicative of household ambulator  ADL: ADL Overall ADL's : Needs assistance/impaired Eating/Feeding: Minimal assistance, Sitting Grooming: Minimal assistance, Sitting Upper Body Bathing: Minimal assistance, Sitting Lower Body Bathing: Moderate assistance, +2 for physical assistance, +2 for safety/equipment, Sit to/from stand Upper Body Dressing : Sitting, Minimal assistance Lower Body Dressing: +2 for physical assistance, Maximal assistance, +2 for safety/equipment, Sit to/from stand Lower Body Dressing Details (indicate cue type and reason): modA +2 for standing balance Toilet Transfer:  Moderate assistance, +2 for physical assistance, +2 for safety/equipment, Stand-pivot Toilet Transfer Details (indicate cue type and reason): simulated via transfer to EOB from recliner Toileting- Clothing Manipulation and Hygiene: Maximal assistance, +2 for physical assistance, +2 for safety/equipment, Sit to/from stand Functional mobility during ADLs: Moderate assistance, +2 for physical assistance, +2 for safety/equipment(stand pivot transfers) General ADL Comments: pt with impaired cognition,  decreased coordination, impaired standing balance and decreased activity tolerance  Cognition: Cognition Overall Cognitive Status: (pt presented confused and a little hallucinatory.) Arousal/Alertness: Awake/alert Orientation Level: Disoriented X4 Attention: Focused, Sustained Focused Attention: Appears intact Sustained Attention: Appears intact Awareness: Impaired Awareness Impairment: Intellectual impairment Problem Solving: Impaired Problem Solving Impairment: Verbal complex(1/3) Cognition Arousal/Alertness: Awake/alert Behavior During Therapy: WFL for tasks assessed/performed, Impulsive Overall Cognitive Status: (pt presented confused and a little hallucinatory.) Area of Impairment: Attention, Memory, Following commands, Safety/judgement, Awareness Current Attention Level: Sustained Memory: Decreased short-term memory Following Commands: Follows one step commands inconsistently, Follows one step commands with increased time Safety/Judgement: Decreased awareness of safety, Decreased awareness of deficits Awareness: Intellectual General Comments: pt somewhat impulsive and requires cues for safety, requiring cues/assist from nursing staff prior to start of session to remain seated in recliner as pt attempting to get up on his own; pt with decreased insight into current deficits as he attempts to get up unassisted but needed external assist for safe mobility completion; pt also with difficulty following commands without additional cues  Blood pressure 101/86, pulse (!) 128, temperature 98.4 F (36.9 C), temperature source Oral, resp. rate 20, height 6\' 3"  (1.905 m), weight 104 kg, SpO2 98 %. Physical Exam  Constitutional:  lethargic  HENT:  Head: Normocephalic.  Eyes: Right eye exhibits no discharge. Left eye exhibits no discharge.  Neck: Normal range of motion.  Cardiovascular: Normal rate.  Respiratory: Effort normal.  GI: Soft.  Neurological:  Patient is lethargic. He does  follow some demonstrated commands but inconsistently. Hypophonia due to laryngectomy. He only uses some simple words. Poor sitting balance, leaning to right. Able to help correct posture somewhat. Moves left more spontaneously than right.   Psychiatric:  Flat, lethargic    LabResultsLast24Hours       Results for orders placed or performed during the hospital encounter of 03/05/19 (from the past 24 hour(s))  Urinalysis, Routine w reflex microscopic     Status: Abnormal   Collection Time: 03/08/19 11:58 AM  Result Value Ref Range   Color, Urine AMBER (A) YELLOW   APPearance CLEAR CLEAR   Specific Gravity, Urine 1.025 1.005 - 1.030   pH 5.0 5.0 - 8.0   Glucose, UA NEGATIVE NEGATIVE mg/dL   Hgb urine dipstick LARGE (A) NEGATIVE   Bilirubin Urine SMALL (A) NEGATIVE   Ketones, ur 15 (A) NEGATIVE mg/dL   Protein, ur NEGATIVE NEGATIVE mg/dL   Nitrite NEGATIVE NEGATIVE   Leukocytes,Ua SMALL (A) NEGATIVE  Urinalysis, Microscopic (reflex)     Status: Abnormal   Collection Time: 03/08/19 11:58 AM  Result Value Ref Range   RBC / HPF 21-50 0 - 5 RBC/hpf   WBC, UA 11-20 0 - 5 WBC/hpf   Bacteria, UA MANY (A) NONE SEEN   Squamous Epithelial / LPF 0-5 0 - 5  Glucose, capillary     Status: Abnormal   Collection Time: 03/08/19  2:35 PM  Result Value Ref Range   Glucose-Capillary 128 (H) 70 - 99 mg/dL   Comment 1 Notify RN    Comment 2 Document in Chart   Glucose, capillary  Status: Abnormal   Collection Time: 03/08/19  5:01 PM  Result Value Ref Range   Glucose-Capillary 128 (H) 70 - 99 mg/dL   Comment 1 Notify RN    Comment 2 Document in Chart   Glucose, capillary     Status: Abnormal   Collection Time: 03/08/19  9:33 PM  Result Value Ref Range   Glucose-Capillary 128 (H) 70 - 99 mg/dL      ImagingResults(Last48hours)  Dg Chest Port 1 View  Result Date: 03/08/2019 CLINICAL DATA:  Shortness of breath. History of stroke, diabetes and  laryngeal cancer. EXAM: PORTABLE CHEST 1 VIEW COMPARISON:  Radiographs 03/06/2019. FINDINGS: 1155 hours. Stable cardiomegaly and aortic atherosclerosis. Patchy left-greater-than-right basilar opacities are slightly increased over the last few days. There is no confluent airspace opacity, edema or large pleural effusion. There is no pneumothorax or acute osseous abnormality. Telemetry leads overlie the chest. IMPRESSION: Mildly increased patchy bibasilar opacities, likely atelectasis. No consolidation or edema. Electronically Signed   By: Richardean Sale M.D.   On: 03/08/2019 12:49      Assessment/Plan: Diagnosis:  Bilateral posterior circulation infarcts with significant mobility and cognitive deficits 1. Does the need for close, 24 hr/day medical supervision in concert with the patient's rehab needs make it unreasonable for this patient to be served in a less intensive setting? Yes and Potentially 2. Co-Morbidities requiring supervision/potential complications: DM, afib, hyperkalemia 3. Due to bladder management, bowel management, safety, skin/wound care, disease management, medication administration and patient education, does the patient require 24 hr/day rehab nursing? Yes 4. Does the patient require coordinated care of a physician, rehab nurse, PT (1-2 hrs/day, 5 days/week), OT (1-2 hrs/day, 5 days/week) and SLP (1-2 hrs/day, 5 days/week) to address physical and functional deficits in the context of the above medical diagnosis(es)? Yes Addressing deficits in the following areas: balance, endurance, locomotion, strength, transferring, bowel/bladder control, bathing, dressing, feeding, grooming, toileting, cognition, speech, language, swallowing and psychosocial support 5. Can the patient actively participate in an intensive therapy program of at least 3 hrs of therapy per day at least 5 days per week? Yes and Potentially 6. The potential for patient to make measurable gains while on inpatient  rehab is good 7. Anticipated functional outcomes upon discharge from inpatient rehab are min assist and mod assist  with PT, min assist and mod assist with OT, supervision and min assist with SLP. 8. Estimated rehab length of stay to reach the above functional goals is: 20-27 days 9. Anticipated D/C setting: Home 10. Anticipated post D/C treatments: [facility] therapy 11. Overall Rehab/Functional Prognosis: excellent  RECOMMENDATIONS: This patient's condition is appropriate for continued rehabilitative care in the following setting: CIR Patient has agreed to participate in recommended program. N/A Note that insurance prior authorization may be required for reimbursement for recommended care.  Comment: Pt with very poor activity tolerance at present. Rehab Admissions Coordinator to follow up.  Thanks,  [name], MD, Mellody Drown  I have personally performed a face to face diagnostic evaluation of this patient. Additionally, I have examined pertinent labs and radiographic images. I have reviewed and concur with the physician assistant's documentation above.    Lavon Paganini Angiulli, PA-C 03/09/2019        Revision History                        Routing History

## 2019-03-12 NOTE — Discharge Summary (Addendum)
Stroke Discharge Summary  Patient ID: David [name]   MRN: [id number]      DOB: 1932/03/17  Date of Admission: 03/05/2019 Date of Discharge: 03/08/2019  Attending Physician:  Garvin Fila, MD, Stroke MD Consultant(s):   Alger Simons, MD (Physical Medicine & Rehabtilitation) Patient's PCP:  Tonia Ghent, MD  Discharge Diagnoses:  Principal Problem:   Acute embolic stroke [facility]) multifocal d/t AF s/p tPA. Active Problems:   Diabetes mellitus ([place])   HLD (hyperlipidemia)   Essential hypertension   Atrial fibrillation with RVR (HCC)   Lower urinary tract symptoms (LUTS)   Urinary retention   Acute ischemic stroke (HCC)   CHF (congestive heart failure) ([place])   History of stroke  Past Medical History:  Diagnosis Date  . Cancer of neck ([place])   . History of atrial fibrillation   . Hyperkalemia    with Ramipril dose at 10 mg, K wnl at 5 mg. per day  . Hyperlipidemia   . Hypertension    Pulmonary  . Larynx cancer ([place])   . Right homonymous hemianopsia    [name] 12/09/2017  . Stroke ([place]) 11/2017   peripheral vision loss both eyes/notes 12/09/2017  . Type II diabetes mellitus ([place])   . Urinary retention   . Vertebral fracture, osteoporotic [facility])    Past Surgical History:  Procedure Laterality Date  . AMPUTATION FINGER / THUMB Right    distal amputation x4 fingers (not thumb)   . CATARACT EXTRACTION W/ INTRAOCULAR LENS  IMPLANT, BILATERAL Bilateral 2007  . DOPPLER ECHOCARDIOGRAPHY     Atrial fib EF 60%--01/18/1994, 12/02/02--atrial fib, EF 50-55  . DOPPLER ECHOCARDIOGRAPHY     ra,la,lv,mild dilat.,trace mitral regurg., mild pulm. htn  . PILONIDAL CYST EXCISION    . RADICAL NECK DISSECTION Left ~ 1993  . TOTAL LARYNGECTOMY  ~1993   with left radical neck dissection    Medications to be continued on Rehab Allergies as of 02/28/2019      Reactions   Actos [pioglitazone] Other (See Comments)   Hematuria (Blood in the urine) STOPPED BY MD   Eliquis [apixaban] Other  (See Comments)   Hematuria (Blood in the urine) STOPPED BY MD      Medication List    STOP taking these medications   diphenhydrAMINE 25 mg capsule Commonly known as:  BENADRYL   diphenhydramine-acetaminophen 25-500 MG Tabs tablet Commonly known as:  TYLENOL PM   hydrochlorothiazide 12.5 MG tablet Commonly known as:  HYDRODIURIL   ramipril 5 MG capsule Commonly known as:  ALTACE     TAKE these medications   acetaminophen 500 MG tablet Commonly known as:  TYLENOL Take 500 mg by mouth every 6 (six) hours as needed for mild pain or headache.   apixaban 5 MG Tabs tablet Commonly known as:  ELIQUIS Take 1 tablet (5 mg total) by mouth 2 (two) times daily.   cefTRIAXone 1 g in sodium chloride 0.9 % 100 mL Inject 1 g into the vein daily for 2 days.   feeding supplement (ENSURE ENLIVE) Liqd Take 237 mLs by mouth 2 (two) times daily between meals.   insulin aspart 100 UNIT/ML injection Commonly known as:  novoLOG Inject 0-9 Units into the skin 3 (three) times daily with meals.   metFORMIN 500 MG tablet Commonly known as:  GLUCOPHAGE Take 1 tablet (500 mg total) by mouth 2 (two) times daily with a meal.   metoprolol tartrate 25 MG tablet Commonly known as:  LOPRESSOR Take  0.5 tablets (12.5 mg total) by mouth 2 (two) times daily.   Percocet 5-325 MG tablet Generic drug:  oxyCODONE-acetaminophen Take 1 tablet by mouth daily as needed (for back pain).   QUEtiapine 25 MG tablet Commonly known as:  SEROQUEL Take 1 tablet (25 mg total) by mouth at bedtime.   simvastatin 20 MG tablet Commonly known as:  ZOCOR Take 1 tablet (20 mg total) by mouth at bedtime. What changed:    medication strength  how much to take   tamsulosin 0.4 MG Caps capsule Commonly known as:  FLOMAX Take 1 capsule (0.4 mg total) by mouth daily. What changed:  when to take this       LABORATORY STUDIES CBC    Component Value Date/Time   WBC 10.2 03/11/2019 0350   RBC 3.90 (L) 03/11/2019  0350   HGB 12.5 (L) 03/11/2019 0350   HCT 39.8 03/11/2019 0350   HCT 36.3 (L) 03/11/2019 0350   PLT 179 03/11/2019 0350   MCV 102.1 (H) 03/11/2019 0350   MCH 32.1 03/11/2019 0350   MCHC 31.4 03/11/2019 0350   RDW 14.8 03/11/2019 0350   LYMPHSABS 1.3 03/05/2019 1738   MONOABS 0.7 03/05/2019 1738   EOSABS 0.1 03/05/2019 1738   BASOSABS 0.0 03/05/2019 1738   CMP    Component Value Date/Time   NA 142 03/11/2019 0350   K 4.3 03/11/2019 0350   CL 109 03/11/2019 0350   CO2 22 03/11/2019 0350   GLUCOSE 132 (H) 03/11/2019 0350   BUN 39 (H) 03/11/2019 0350   CREATININE 1.19 03/11/2019 0350   CALCIUM 8.9 03/11/2019 0350   PROT 7.3 03/05/2019 1738   ALBUMIN 3.5 03/05/2019 1738   AST 27 03/05/2019 1738   ALT 16 03/05/2019 1738   ALKPHOS 112 03/05/2019 1738   BILITOT 1.0 03/05/2019 1738   GFRNONAA 55 (L) 03/11/2019 0350   GFRAA >60 03/11/2019 0350   COAGS Lab Results  Component Value Date   INR 1.2 03/05/2019   INR 1.11 12/09/2017   Lipid Panel    Component Value Date/Time   CHOL 92 03/06/2019 0222   TRIG 30 03/06/2019 0222   TRIG 85 12/05/2006 1045   HDL 48 03/06/2019 0222   CHOLHDL 1.9 03/06/2019 0222   VLDL 6 03/06/2019 0222   LDLCALC 38 03/06/2019 0222   HgbA1C  Lab Results  Component Value Date   HGBA1C 6.6 (H) 03/06/2019   Urinalysis    Component Value Date/Time   COLORURINE AMBER (A) 03/08/2019 1158   APPEARANCEUR CLEAR 03/08/2019 1158   LABSPEC 1.025 03/08/2019 1158   PHURINE 5.0 03/08/2019 1158   GLUCOSEU NEGATIVE 03/08/2019 1158   HGBUR LARGE (A) 03/08/2019 1158   BILIRUBINUR SMALL (A) 03/08/2019 1158   BILIRUBINUR Neg 12/18/2018 1024   KETONESUR 15 (A) 03/08/2019 1158   PROTEINUR NEGATIVE 03/08/2019 1158   UROBILINOGEN 0.2 12/18/2018 1024   NITRITE NEGATIVE 03/08/2019 1158   LEUKOCYTESUR SMALL (A) 03/08/2019 1158   Urine Drug Screen     Component Value Date/Time   LABOPIA NONE DETECTED 03/06/2019 0004   COCAINSCRNUR NONE DETECTED 03/06/2019  0004   LABBENZ NONE DETECTED 03/06/2019 0004   AMPHETMU NONE DETECTED 03/06/2019 0004   THCU NONE DETECTED 03/06/2019 0004   LABBARB NONE DETECTED 03/06/2019 0004    Alcohol Level    Component Value Date/Time   ETH <10 03/05/2019 1739     SIGNIFICANT DIAGNOSTIC STUDIES Ct Angio Head W Or Wo Contrast  Result Date: 03/05/2019 CLINICAL DATA:  Right-sided facial  droop and weakness EXAM: CT ANGIOGRAPHY HEAD AND NECK TECHNIQUE: Multidetector CT imaging of the head and neck was performed using the standard protocol during bolus administration of intravenous contrast. Multiplanar CT image reconstructions and MIPs were obtained to evaluate the vascular anatomy. Carotid stenosis measurements (when applicable) are obtained utilizing NASCET criteria, using the distal internal carotid diameter as the denominator. CONTRAST:  120mL OMNIPAQUE IOHEXOL 350 MG/ML SOLN COMPARISON:  None. Head CT 03/05/2019 FINDINGS: CTA NECK FINDINGS SKELETON: There is no bony spinal canal stenosis. No lytic or blastic lesion. OTHER NECK: Prior left neck dissection with tracheostomy. Status post laryngectomy. UPPER CHEST: No pneumothorax or pleural effusion. No nodules or masses. AORTIC ARCH: There is mild calcific atherosclerosis of the aortic arch. There is no aneurysm, dissection or hemodynamically significant stenosis of the visualized ascending aorta and aortic arch. Conventional 3 vessel aortic branching pattern. The visualized proximal subclavian arteries are widely patent. RIGHT CAROTID SYSTEM: --Common carotid artery: Widely patent origin without common carotid artery dissection or aneurysm. --Internal carotid artery: No dissection, occlusion or aneurysm. Mild atherosclerotic calcification at the carotid bifurcation without hemodynamically significant stenosis. --External carotid artery: No acute abnormality. LEFT CAROTID SYSTEM: --Common carotid artery: Widely patent origin without common carotid artery dissection or aneurysm.  --Internal carotid artery: No dissection, occlusion or aneurysm. Mild atherosclerotic calcification at the carotid bifurcation without hemodynamically significant stenosis. --External carotid artery: No acute abnormality. VERTEBRAL ARTERIES: Left dominant configuration. The left vertebral artery is occluded at its origin and throughout the V1 segment. There is opacification beginning at the proximal V2 segment there is moderate narrowing of the left vertebral artery lumen at the C5 level (6:116). The left vertebral artery is otherwise normal to the skull base. No right vertebral stenosis. CTA HEAD FINDINGS POSTERIOR CIRCULATION: --Vertebral arteries: There is moderate calcification of the V4 segment of the left vertebral artery without high-grade stenosis. --Posterior inferior cerebellar arteries (PICA): The left PICA is occluded proximally. Right PICA is patent. --Anterior inferior cerebellar arteries (AICA): Not clearly visualized, though this is not uncommon. --Basilar artery: Normal. --Superior cerebellar arteries: Normal. --Posterior cerebral arteries (PCA): The left posterior cerebral artery is occluded at the proximal P2 segment. The right PCA is patent. ANTERIOR CIRCULATION: --Intracranial internal carotid arteries: Normal. --Anterior cerebral arteries (ACA): Normal. Both A1 segments are present. Patent anterior communicating artery (a-comm). --Middle cerebral arteries (MCA): Normal. VENOUS SINUSES: As permitted by contrast timing, patent. ANATOMIC VARIANTS: None DELAYED PHASE: No parenchymal contrast enhancement. Review of the MIP images confirms the above findings. IMPRESSION: 1. 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 2. Left PICA occlusion. 3. Occlusion of the left vertebral artery at its origin and throughout the V1 segment with moderate narrowing of the mid V2 segment. 4. Bilateral carotid bifurcation atherosclerosis with less than 50% stenosis. 5.  Aortic  atherosclerosis (ICD10-I70.0). 6. Status post laryngectomy, tracheostomy and left neck dissection. Electronically Signed   By: Ulyses Jarred M.D.   On: 03/05/2019 18:23   Dg Chest 1 View  Result Date: 03/06/2019 CLINICAL DATA:  Wheezing. EXAM: CHEST  1 VIEW COMPARISON:  Chest x-ray from earlier same day and chest x-ray dated 12/04/2018. FINDINGS: Heart size and mediastinal contours are stable. Lungs are clear. No pleural effusion or pneumothorax seen. Osseous structures about the chest are unremarkable. IMPRESSION: No active disease. No evidence of pneumonia or pulmonary edema. Electronically Signed   By: Franki Cabot M.D.   On: 03/06/2019 21:08   Ct Head Wo Contrast  Result Date:  03/06/2019 CLINICAL DATA:  Stroke.  24 hours post tPA EXAM: CT HEAD WITHOUT CONTRAST TECHNIQUE: Contiguous axial images were obtained from the base of the skull through the vertex without intravenous contrast. COMPARISON:  CT head 03/05/2019 FINDINGS: Brain: Negative for acute hemorrhage Hypodensity left medial cerebellum again noted and slightly less prominent. Possible acute infarct. Moderate atrophy. Chronic microvascular ischemic changes throughout the white matter. Chronic infarct in the left occipital lobe. Chronic infarcts in the cerebellum bilaterally. Vascular: Negative for hyperdense vessel Skull: Negative Sinuses/Orbits: Paranasal sinuses clear. Bilateral cataract surgery. Other: None IMPRESSION: 1. Negative for acute hemorrhage 2. Atrophy and extensive chronic ischemic change 3. Persistent hypodensity left medial cerebellum may represent acute infarct. Recommend MRI Electronically Signed   By: Franchot Gallo M.D.   On: 03/06/2019 18:01   Ct Angio Neck W Or Wo Contrast  Result Date: 03/05/2019 CLINICAL DATA:  Right-sided facial droop and weakness EXAM: CT ANGIOGRAPHY HEAD AND NECK TECHNIQUE: Multidetector CT imaging of the head and neck was performed using the standard protocol during bolus administration of  intravenous contrast. Multiplanar CT image reconstructions and MIPs were obtained to evaluate the vascular anatomy. Carotid stenosis measurements (when applicable) are obtained utilizing NASCET criteria, using the distal internal carotid diameter as the denominator. CONTRAST:  164mL OMNIPAQUE IOHEXOL 350 MG/ML SOLN COMPARISON:  None. Head CT 03/05/2019 FINDINGS: CTA NECK FINDINGS SKELETON: There is no bony spinal canal stenosis. No lytic or blastic lesion. OTHER NECK: Prior left neck dissection with tracheostomy. Status post laryngectomy. UPPER CHEST: No pneumothorax or pleural effusion. No nodules or masses. AORTIC ARCH: There is mild calcific atherosclerosis of the aortic arch. There is no aneurysm, dissection or hemodynamically significant stenosis of the visualized ascending aorta and aortic arch. Conventional 3 vessel aortic branching pattern. The visualized proximal subclavian arteries are widely patent. RIGHT CAROTID SYSTEM: --Common carotid artery: Widely patent origin without common carotid artery dissection or aneurysm. --Internal carotid artery: No dissection, occlusion or aneurysm. Mild atherosclerotic calcification at the carotid bifurcation without hemodynamically significant stenosis. --External carotid artery: No acute abnormality. LEFT CAROTID SYSTEM: --Common carotid artery: Widely patent origin without common carotid artery dissection or aneurysm. --Internal carotid artery: No dissection, occlusion or aneurysm. Mild atherosclerotic calcification at the carotid bifurcation without hemodynamically significant stenosis. --External carotid artery: No acute abnormality. VERTEBRAL ARTERIES: Left dominant configuration. The left vertebral artery is occluded at its origin and throughout the V1 segment. There is opacification beginning at the proximal V2 segment there is moderate narrowing of the left vertebral artery lumen at the C5 level (6:116). The left vertebral artery is otherwise normal to the skull  base. No right vertebral stenosis. CTA HEAD FINDINGS POSTERIOR CIRCULATION: --Vertebral arteries: There is moderate calcification of the V4 segment of the left vertebral artery without high-grade stenosis. --Posterior inferior cerebellar arteries (PICA): The left PICA is occluded proximally. Right PICA is patent. --Anterior inferior cerebellar arteries (AICA): Not clearly visualized, though this is not uncommon. --Basilar artery: Normal. --Superior cerebellar arteries: Normal. --Posterior cerebral arteries (PCA): The left posterior cerebral artery is occluded at the proximal P2 segment. The right PCA is patent. ANTERIOR CIRCULATION: --Intracranial internal carotid arteries: Normal. --Anterior cerebral arteries (ACA): Normal. Both A1 segments are present. Patent anterior communicating artery (a-comm). --Middle cerebral arteries (MCA): Normal. VENOUS SINUSES: As permitted by contrast timing, patent. ANATOMIC VARIANTS: None DELAYED PHASE: No parenchymal contrast enhancement. Review of the MIP images confirms the above findings. IMPRESSION: 1. 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 2. Left  PICA occlusion. 3. Occlusion of the left vertebral artery at its origin and throughout the V1 segment with moderate narrowing of the mid V2 segment. 4. Bilateral carotid bifurcation atherosclerosis with less than 50% stenosis. 5.  Aortic atherosclerosis (ICD10-I70.0). 6. Status post laryngectomy, tracheostomy and left neck dissection. Electronically Signed   By: Ulyses Jarred M.D.   On: 03/05/2019 18:23   Mr Brain Wo Contrast  Result Date: 03/06/2019 CLINICAL DATA:  Altered mental status and possible stroke EXAM: MRI HEAD WITHOUT CONTRAST TECHNIQUE: Multiplanar, multiecho pulse sequences of the brain and surrounding structures were obtained without intravenous contrast. COMPARISON:  03/06/2019 head CT FINDINGS: BRAIN: There is multifocal acute ischemia involving the left thalamus, both temporal  lobes and the left cerebellum. The midline structures are normal. There are old infarcts of the left occipital lobe and left cerebellum. Diffuse confluent hyperintense T2-weighted signal within the periventricular, deep and juxtacortical white matter, most commonly due to chronic ischemic microangiopathy. Generalized atrophy without lobar predilection. No acute hemorrhage. No mass lesion. VASCULAR: The major intracranial arterial and venous sinus flow voids are normal. SKULL AND UPPER CERVICAL SPINE: Calvarial bone marrow signal is normal. There is no skull base mass. Visualized upper cervical spine and soft tissues are normal. SINUSES/ORBITS: No fluid levels or advanced mucosal thickening. No mastoid or middle ear effusion. The orbits are normal. IMPRESSION: 1. Multifocal bilateral posterior circulation acute ischemic infarcts, including the left thalamus, both medial temporal lobes and the left cerebellum. 2. No hemorrhage or mass effect. 3. Chronic ischemic microangiopathy and multiple old infarcts. Electronically Signed   By: Ulyses Jarred M.D.   On: 03/06/2019 18:44   Dg Chest Port 1 View  Result Date: 03/08/2019 CLINICAL DATA:  Shortness of breath. History of stroke, diabetes and laryngeal cancer. EXAM: PORTABLE CHEST 1 VIEW COMPARISON:  Radiographs 03/06/2019. FINDINGS: 1155 hours. Stable cardiomegaly and aortic atherosclerosis. Patchy left-greater-than-right basilar opacities are slightly increased over the last few days. There is no confluent airspace opacity, edema or large pleural effusion. There is no pneumothorax or acute osseous abnormality. Telemetry leads overlie the chest. IMPRESSION: Mildly increased patchy bibasilar opacities, likely atelectasis. No consolidation or edema. Electronically Signed   By: Richardean Sale M.D.   On: 03/08/2019 12:49   Dg Chest Port 1 View  Result Date: 03/06/2019 CLINICAL DATA:  Altered mental status. History of congestive heart failure. EXAM: PORTABLE CHEST 1  VIEW COMPARISON:  PA and lateral chest 12/04/2018 and 12/09/2017. FINDINGS: There is cardiomegaly without edema. Lung volumes are low with mild subsegmental atelectasis in the bases. No pneumothorax or pleural effusion. Aortic atherosclerosis noted. No acute bony abnormality. IMPRESSION: Cardiomegaly without edema.  No acute disease. Atherosclerosis. Electronically Signed   By: Inge Rise M.D.   On: 03/06/2019 13:29   Ct Head Code Stroke Wo Contrast  Result Date: 03/05/2019 CLINICAL DATA:  Code stroke. Right facial droop. Right-sided weakness. History of throat cancer. History of stroke. EXAM: CT HEAD WITHOUT CONTRAST TECHNIQUE: Contiguous axial images were obtained from the base of the skull through the vertex without intravenous contrast. COMPARISON:  MRI head 12/09/2017 FINDINGS: Brain: Moderate atrophy. Chronic infarct left occipital lobe. Chronic infarcts in the basal ganglia and white matter bilaterally. Ill-defined hypodensity left medial cerebellum is new since the prior CT and may represent acute infarct. Additional small areas of chronic infarct left cerebellum have developed since the prior MRI. Small chronic infarct in the right cerebellum. Negative for hemorrhage or mass. Vascular: Negative for hyperdense vessel. Atherosclerotic calcification at the skull  base Skull: Negative Sinuses/Orbits: Paranasal sinuses clear. Normal orbit. Bilateral cataract surgery Other: None ASPECTS ([place] Stroke Program Early CT Score) - Ganglionic level infarction (caudate, lentiform nuclei, internal capsule, insula, M1-M3 cortex): 7 - Supraganglionic infarction (M4-M6 cortex): 3 Total score (0-10 with 10 being normal): 10 IMPRESSION: 1. Findings suspicious for a small area of acute infarction left medial cerebellum. 2. Extensive chronic ischemic changes as above. Chronic left PCA infarct. 3. ASPECTS is 10 Electronically Signed   By: Franchot Gallo M.D.   On: 03/05/2019 17:52   Transthoracic  Echocardiogram 03/06/2019 1. The left ventricle has mild-moderately reduced systolic function, with an ejection fraction of 40-45%. The cavity size was normal. Left ventricular diastolic Doppler parameters are indeterminate. 2. EF hard to estimate due to rapid irregular rhythm. Inferior basal wall hypokinesis. 3. The right ventricle has normal systolic function. The cavity was normal. There is no increase in right ventricular wall thickness. 4. Left atrial size was severely dilated. 5. Right atrial size was severely dilated. 6. Mild thickening of the mitral valve leaflet. 7. Tricuspid valve regurgitation is severe. 8. The aortic valve is tricuspid. Mild thickening of the aortic valve. Mild calcification of the aortic valve. 9. The inferior vena cava was dilated in size with <50% respiratory variability. 10. RA is massively dilated TV insertion seems normal doubt Ebsteins. Cannot r/o small mobile thrombus see last image vs shadowing artifact. 11. The interatrial septum was not well visualized.      HISTORY OF PRESENT ILLNESS David [name] is an 85 y.o. male with past medical history of laryngeal cancer status post tracheostomy, hyperlipidemia, hypertension, type 2 diabetes mellitus, prior right PCA stroke with right homonymous hemianopsia, atrial fibrillation not on anticoagulation presents to the ER as a code stroke. He was last seen normal at 2 PM on 03/05/2019 by his wife.  History was obtained by EMS.  Patient was noted to have been on the floor and when EMS assisted patient was weaker on the right side. On arrival stat CT head  showed no acute findings.  NIHSS 8. Baseline mRS 2. Discussed with patient and  TPA was administered. Admitted to the neuro ICU.   HOSPITAL COURSE David [name] is a 85 y.o. male with history of laryngeal cancer status post tracheostomy, hyperlipidemia, hypertension, type 2 diabetes mellitus, prior right PCA stroke with right homonymous hemianopsia,atrial  fibrillation not on anticoagulation found down, presenting with R sided weakness. Received tPA 03/05/2019 at 1755.  Stroke:  Multifocal infarcts including left thalamus, bilateral medial temporal lobes and the left cerebellum, embolic pattern secondary to known AF not on [facility]  Code Stroke CT head small acute L medial cerebellar stroke. Extensive small vessel disease. Old L PCA infarct. ASPECTS 10.     CTA head & neck occlusion L PCA at P2. L PICA occlusion. L VA occlusion at origin V1. B ICA < 50%. Aortic atherosclerosis. Laryngeal surgical changes w/ trach.  MRI - Multifocal bilateral posterior circulation acute ischemic infarcts, including the left thalamus, both medial temporal lobes and the left cerebellum. Multiple old infarcts.   2D Echo - EF 40 - 45%. RA cannot r/o small mobile thrombus see last image vs shadowing artifact. (currently on Eliquis)  LDL 38  HgbA1c 6.6  Eliquis for VTE prophylaxis  No antithrombotic prior to admission, now on eliquis for stroke prevention  Therapy recommendations:  CIR  Disposition:  CIR   Atrial Fibrillation w/ RVR and frequent PVCs  Home anticoagulation:  none   Stopped  AC d/t hematuria - off as of 12/18/2018 by FP MD. HGB stable over time. Urine only pink color as per pt. Per pt has been pink since eliquis started in 2019  Put on metoprolol low dose for rate control  Frequent PVCs on telemetry  Continue Eliquis (apixaban) daily at discharge  Urology is OK for eliquis    Hx stroke/TIA  11/2017 - Late acute/early subacute infarction Left occipital lobe and posteromedial temporal lobe punctate areas of infarction in left posterior thalamus and left crus of fornix. Embolic from AF not on AC. Discharged on eliquis. Resultant R HH.   TIA with aphasia for 20 min in 04/2017  Hematuria  Per pt has been pink since eliquis started in 2019  eliquis stopped in 11/2018  Pt followed with Dr. Diona Fanti and found to have UTI, was put on Abx twice,  next appointment 03/11/19  UA this admission neg for RBC, WBC 6-10  H&H stable   Eliquis restarted after discuss with urology on call MD  Urinary retention with UTI  Status post in and out cath  Mild traumatic hematuria  UA showed RBC 21-50  UA WBC 11-20, increased from 2 days ago 6-10  Start Rocephin 4/12 (7D)  Urine culture > 100k klebsella oxytoca. Sensitive to ceftriaxone   Foley reinserted 03/09/2019  Voiding trials on rehab  Copious secretion from laryngectomy  Leukocytosis  History of laryngeal cancer stat post laryngectomy  Copious secretion from laryngectomy site  Suctioning PRN  WBC 11.7  On Rocephin 4/12, plan 7 days course  CXR 03/08/2019 patchy bibasilar opacities, no consolidation or pneumonia  May consider indomethacin consult if getting worse  Cognitive impairment, lethargy  Wife stated that pt does have cognitive impairment at home  Patient overnight agitation with sundowning  Received Haldol and Ativan for MRI  Lethargy, improved after decreasing hs seroquel dose to 25.  ? R/t med effect, UTI, PNA.  Hypertension  Home meds: HCTZ 25, ramipril 5  Stable  On metoprolol for rate control and BP  Long-term BP goal normotensive  Hyperlipidemia  Home meds:  zocor 40, resumed in hospital  LDL 38, goal < 70  Decreased zocor dose to 20  Continue statin at discharge  Diabetes type II Controlled  Home meds:  metformin  HgbA1c 6.6, goal < 7.0  Resume metformin on discharge  Other Stroke Risk Factors  Advanced age  Former Cigarette smoker, quit 27 yrs ago  Overweight, Body mass index is 28.66 kg/m., recommend weight loss, diet and exercise as appropriate   Hx CHF - CXR no acute disease or edema - on diet - d/c IVF  Other Active Problems  Renal insufficiency, resolved Cr 1.19  Elevated MCV 102.7. VB12 1386, folate >620  - both elevated   Malnutrition - Ensure Enlive added   DISCHARGE EXAM Blood pressure  109/81, pulse (!) 114, temperature (!) 97.5 F (36.4 C), temperature source Oral, resp. rate 19, height 6\' 3"  (1.905 m), weight 104 kg, SpO2 96 %. General - Well nourished, well developed, drowsy and sleepy.  Ophthalmologic - fundi not visualized due to noncooperation.  Cardiovascular - irregularly irregular heart rate and rhythm.  Atrial fibrillation  Neuro -awake alert and follows occasional commands.    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 Paucity of speech but able to mouth his name and answer yes or no, no obvious aphasia. Still has chronic right hemianopia, right nasolabial fold flattening. Tongue midline, no gaze preference, PERRL, EOMI. Moving all extremities on command. Sensation, coordination not corporative  and gait not tested.  Discharge Diet  Heart healthy /carb modified thin liquids  DISCHARGE PLAN  Disposition:  Transfer to [facility] for ongoing PT, OT and ST  Eliquis (apixaban) daily for secondary stroke prevention   Recommend ongoing stroke risk factor control by Primary Care Physician at time of discharge from inpatient rehabilitation.  Follow-up Tonia Ghent, MD in 2 weeks following discharge from rehab.  Follow-up in [facility] in 4 weeks following discharge from rehab, office to schedule an appointment.   35 minutes were spent preparing discharge.  Burnetta Sabin, MSN, APRN, ANVP-BC, AGPCNP-BC Advanced Practice Stroke Nurse [place] for Schedule & Pager information 03/14/2019 3:13 PM  I have personally obtained history,examined this patient, reviewed notes, independently viewed imaging studies, participated in medical decision making and plan of care.ROS completed by me personally and pertinent positives fully documented  I have made any additions or clarifications directly to the above note. Agree with note above.    Antony Contras, MD Medical Director [facility] Pager:  [phone] 03/11/2019 3:31 PM

## 2019-03-12 NOTE — TOC Transition Note (Signed)
Transition of Care [facility]) - CM/SW Discharge Note   Patient Details  Name: David [name] MRN: [id number] Date of Birth: 1932-04-18  Transition of [facility] CM/SW Contact:  Pollie Friar, RN Phone Number: 03/23/2019, 3:10 PM   Clinical Narrative:    Pt discharging to CIR today. CM signing off.    Final next level of care: IP Rehab Facility Barriers to Discharge: No Barriers Identified   Patient Goals and CMS Choice        Discharge Placement                       Discharge Plan and Services                          Social Determinants of Health (SDOH) Interventions     Readmission Risk Interventions No flowsheet data found.

## 2019-03-12 NOTE — Progress Notes (Signed)
Inpatient Rehab Admissions Coordinator:   I have insurance authorization and clearance from Dr. Leonie Man for admission to inpatient rehab today.  I have spoken with pt's wife 2x and she is prepared to provide 24/7 supervision/assist with herself and hired caregivers, if needed, at discharge from [place].  I will plan for admission today and will alert RN, CM, and pt/family.   Shann Medal, PT, DPT Admissions Coordinator [phone] 03/06/2019  3:45 PM

## 2019-03-13 ENCOUNTER — Inpatient Hospital Stay (HOSPITAL_COMMUNITY): Payer: Medicare Other | Admitting: Speech Pathology

## 2019-03-13 ENCOUNTER — Inpatient Hospital Stay (HOSPITAL_COMMUNITY): Payer: Medicare Other | Admitting: Occupational Therapy

## 2019-03-13 ENCOUNTER — Inpatient Hospital Stay (HOSPITAL_COMMUNITY): Payer: Medicare Other

## 2019-03-13 DIAGNOSIS — R339 Retention of urine, unspecified: Secondary | ICD-10-CM

## 2019-03-13 DIAGNOSIS — I69351 Hemiplegia and hemiparesis following cerebral infarction affecting right dominant side: Principal | ICD-10-CM

## 2019-03-13 LAB — CBC WITH DIFFERENTIAL/PLATELET
Abs Immature Granulocytes: 0.02 10*3/uL (ref 0.00–0.07)
Basophils Absolute: 0 10*3/uL (ref 0.0–0.1)
Basophils Relative: 1 %
Eosinophils Absolute: 0.2 10*3/uL (ref 0.0–0.5)
Eosinophils Relative: 2 %
HCT: 42.1 % (ref 39.0–52.0)
Hemoglobin: 13 g/dL (ref 13.0–17.0)
Immature Granulocytes: 0 %
Lymphocytes Relative: 10 %
Lymphs Abs: 0.9 10*3/uL (ref 0.7–4.0)
MCH: 32.2 pg (ref 26.0–34.0)
MCHC: 30.9 g/dL (ref 30.0–36.0)
MCV: 104.2 fL — ABNORMAL HIGH (ref 80.0–100.0)
Monocytes Absolute: 1 10*3/uL (ref 0.1–1.0)
Monocytes Relative: 11 %
Neutro Abs: 6.6 10*3/uL (ref 1.7–7.7)
Neutrophils Relative %: 76 %
Platelets: 208 10*3/uL (ref 150–400)
RBC: 4.04 MIL/uL — ABNORMAL LOW (ref 4.22–5.81)
RDW: 14.8 % (ref 11.5–15.5)
WBC: 8.7 10*3/uL (ref 4.0–10.5)
nRBC: 0 % (ref 0.0–0.2)

## 2019-03-13 LAB — COMPREHENSIVE METABOLIC PANEL
ALT: 35 U/L (ref 0–44)
AST: 56 U/L — ABNORMAL HIGH (ref 15–41)
Albumin: 2.6 g/dL — ABNORMAL LOW (ref 3.5–5.0)
Alkaline Phosphatase: 87 U/L (ref 38–126)
Anion gap: 10 (ref 5–15)
BUN: 33 mg/dL — ABNORMAL HIGH (ref 8–23)
CO2: 26 mmol/L (ref 22–32)
Calcium: 8.9 mg/dL (ref 8.9–10.3)
Chloride: 109 mmol/L (ref 98–111)
Creatinine, Ser: 1.04 mg/dL (ref 0.61–1.24)
GFR calc Af Amer: >60 mL/min (ref >60)
GFR calc non Af Amer: >60 mL/min (ref >60)
Glucose, Bld: 132 mg/dL — ABNORMAL HIGH (ref 70–99)
Potassium: 4.3 mmol/L (ref 3.5–5.1)
Sodium: 145 mmol/L (ref 135–145)
Total Bilirubin: 1.4 mg/dL — ABNORMAL HIGH (ref 0.3–1.2)
Total Protein: 6.6 g/dL (ref 6.5–8.1)

## 2019-03-13 LAB — GLUCOSE, CAPILLARY
Glucose-Capillary: 114 mg/dL — ABNORMAL HIGH (ref 70–99)
Glucose-Capillary: 107 mg/dL — ABNORMAL HIGH (ref 70–99)
Glucose-Capillary: 138 mg/dL — ABNORMAL HIGH (ref 70–99)
Glucose-Capillary: 137 mg/dL — ABNORMAL HIGH (ref 70–99)

## 2019-03-13 MED ORDER — LIDOCAINE HCL URETHRAL/MUCOSAL 2 % EX GEL: CUTANEOUS | Status: DC | PRN

## 2019-03-13 MED ORDER — GERHARDT'S BUTT CREAM
TOPICAL_CREAM | Freq: Four times a day (QID) | CUTANEOUS | Status: DC
  Administered 2019-03-13: 18:00:00 via TOPICAL
  Administered 2019-03-13: 1 via TOPICAL
  Administered 2019-03-13 (×2): via TOPICAL
  Administered 2019-03-14: 1 via TOPICAL
  Administered 2019-03-14 (×3): via TOPICAL
  Administered 2019-03-15: 1 via TOPICAL
  Administered 2019-03-15 – 2019-03-21 (×23): via TOPICAL
  Administered 2019-03-21: 1 via TOPICAL
  Administered 2019-03-21 – 2019-03-23 (×7): via TOPICAL
  Administered 2019-03-23: 1 via TOPICAL
  Administered 2019-03-23 – 2019-03-24 (×5): via TOPICAL
  Filled 2019-03-13 (×2): qty 1

## 2019-03-13 NOTE — Evaluation (Signed)
Occupational Therapy Assessment and Plan  Patient Details  Name: David [name] MRN: [id number] Date of Birth: December 26, 1931  OT Diagnosis: abnormal posture, acute pain, apraxia, ataxia, cognitive deficits, disturbance of vision, hemiplegia affecting dominant side and muscle weakness (generalized) Rehab Potential: Rehab Potential (ACUTE ONLY): Fair ELOS: ~4 weeks (28 days)   Today's Date: 03/13/2019 OT Individual Time: 1010-1100 OT Individual Time Calculation (min): 50 min     Problem List:  Patient Active Problem List   Diagnosis Date Noted  . Hemiparesis affecting right side as late effect of stroke ([place]) 03/03/2019  . Cerebrovascular accident (CVA) of left thalamus ([place]) 03/01/2019  . CHF (congestive heart failure) ([place])   . History of stroke   . Acute ischemic stroke ([place]) 03/05/2019  . Hematuria 12/21/2018  . Vertebral fracture, osteoporotic ([place]) 08/29/2018  . Advance care planning 03/19/2018  . Hemianopia, homonymous, right   . Acute embolic stroke ([place]) multifocal d/t AF s/p tPA. 12/09/2017  . Loss of peripheral visual field 12/09/2017  . Urinary retention   . Healthcare maintenance 01/01/2017  . Lower urinary tract symptoms (LUTS) 01/01/2017  . Tear of biceps muscle 07/17/2016  . Medicare annual wellness visit, initial 10/31/2012  . Malignant neoplasm of larynx ([place]) 09/22/2007  . Diabetes mellitus ([place]) 02/13/2007  . HLD (hyperlipidemia) 02/13/2007  . Essential hypertension 02/13/2007  . Atrial fibrillation with RVR ([place]) 02/13/2007    Past Medical History:  Past Medical History:  Diagnosis Date  . Cancer of neck ([place])   . History of atrial fibrillation   . Hyperkalemia    with Ramipril dose at 10 mg, K wnl at 5 mg. per day  . Hyperlipidemia   . Hypertension    Pulmonary  . Larynx cancer ([place])   . Right homonymous hemianopsia    [name] 12/09/2017  . Stroke ([place]) 11/2017   peripheral vision loss both eyes/notes 12/09/2017  . Type II diabetes mellitus ([place])   .  Urinary retention   . Vertebral fracture, osteoporotic [facility])    Past Surgical History:  Past Surgical History:  Procedure Laterality Date  . AMPUTATION FINGER / THUMB Right    distal amputation x4 fingers (not thumb)   . CATARACT EXTRACTION W/ INTRAOCULAR LENS  IMPLANT, BILATERAL Bilateral 2007  . DOPPLER ECHOCARDIOGRAPHY     Atrial fib EF 60%--01/18/1994, 12/02/02--atrial fib, EF 50-55  . DOPPLER ECHOCARDIOGRAPHY     ra,la,lv,mild dilat.,trace mitral regurg., mild pulm. htn  . PILONIDAL CYST EXCISION    . RADICAL NECK DISSECTION Left ~ 1993  . TOTAL LARYNGECTOMY  ~1993   with left radical neck dissection    Assessment & Plan Clinical Impression: David [name] is an 85 year old right-handed male history of diabetes mellitus, laryngeal cancer status post laryngectomy, prior right PCA infarction with right homonymous hemianopsia as well as cognitive impairment, atrial fibrillation not on anticoagulation due to bouts of hematuria. Per chart review patient lives with spouse. Independent with assistive device using a straight cane. One level home. Presented 03/05/2019 after being found down on the floor with right-sided weakness and facial droop. Blood pressure 184/182. CT of the head showed small area of acute infarction left medial cerebellum. CT angiogram of head and neck occlusion of the left posterior cerebral artery at the proximal P2 segment area left PICA occlusion. Patient did not receive TPA. MRI the brain showed multifocal bilateral posterior circulation acute ischemic infarctions involving the left thalamus both medial temporal lobes in the left cerebellum. No hemorrhage or mass effect. Echocardiogram with ejection  fraction of 45% mild to moderate reduced systolic function. Inferior basal wall hypokinesis. Neurology service is consulted maintained on Eliquis after being cleared by urology services for noted history of hematuria. Urine culture greater than 100,000 Klebsiella oxytoca and  completing a course of Rocephin.patient noted history of laryngeal cancer with laryngectomy noted fair amount of copious secretions latest chest x-ray showed patchy bibasilar opacities, no consolidation or pneumonia. Tolerating a regular diet. Therapy evaluations completed and patient was admitted for a comprehensive rehabilitation program Patient transferred to CIR on 03/23/2019 .    Patient currently requires total +2 with basic self-care skills secondary to muscle weakness, decreased cardiorespiratoy endurance and decreased oxygen support, abnormal tone, motor apraxia, decreased coordination and decreased motor planning, hemianopsia, decreased attention, decreased awareness, decreased problem solving, decreased safety awareness, decreased memory and delayed processing and decreased sitting balance, decreased postural control, hemiplegia and decreased balance strategies.  Prior to hospitalization, patient could complete ADLs  with modified independent .  Patient will benefit from skilled intervention to decrease level of assist with basic self-care skills and increase independence with basic self-care skills prior to discharge home with care partner.  Anticipate patient will require moderate physical assestance and follow up home health.  OT - End of Session Activity Tolerance: Tolerates < 10 min activity with changes in vital signs Endurance Deficit: Yes OT Assessment Rehab Potential (ACUTE ONLY): Fair OT Patient demonstrates impairments in the following area(s): Balance;Perception;Behavior;Safety;Cognition;Edema;Skin Integrity;Sensory;Endurance;Vision;Motor;Nutrition;Pain OT Basic ADL's Functional Problem(s): Grooming;Eating;Bathing;Dressing;Toileting OT Transfers Functional Problem(s): Toilet;Tub/Shower OT Additional Impairment(s): Fuctional Use of Upper Extremity OT Plan OT Intensity: Minimum of 1-2 x/day, 45 to 90 minutes OT Frequency: 5 out of 7 days OT Duration/Estimated Length of Stay: ~4  weeks (28 days) OT Treatment/Interventions: Balance/vestibular training;Discharge planning;Functional electrical stimulation;Pain management;Self Care/advanced ADL retraining;Therapeutic Activities;UE/LE Coordination activities;Visual/perceptual remediation/compensation;Therapeutic Exercise;Skin care/wound managment;Patient/family education;Functional mobility training;Disease mangement/prevention;Cognitive remediation/compensation;Community reintegration;DME/adaptive equipment instruction;Neuromuscular re-education;Psychosocial support;Splinting/orthotics;UE/LE Strength taining/ROM;Wheelchair propulsion/positioning OT Self Feeding Anticipated Outcome(s): supervision OT Basic Self-Care Anticipated Outcome(s): mod A  OT Toileting Anticipated Outcome(s): Mod A  OT Bathroom Transfers Anticipated Outcome(s): mod A OT Recommendation Recommendations for Other Services: Neuropsych consult Patient destination: Home Follow Up Recommendations: Home health OT Equipment Recommended: To be determined   Skilled Therapeutic Intervention Pt seen for OT eval and session focusing on mobility and positioning. Pt with hand off from SLP. Pt in side-lying position upon arrival. RN present and needed pt transferred to air mattress. He requires +2 assist for bed mobility to place maximove sling. Pt attempting to use electrolarynx throughout session with minimal success.  Use of maximove to transfer pt to air mattress. Positioned upright in bed in neutral position. Pt left sitting upright in bed with all needs in reach. Pt on 5L supplement O2, 97% with activity.  Pt unable to make basic needs known. Appears in distress, however, vital WNL. RN made aware.   OT Evaluation Precautions/Restrictions  Precautions Precautions: Fall;Other (comment) Precaution Comments: R inattention; R hemianopsia from old CVA; uses vocal stimulator for communication; trach collar for supplemental O2 over stoma site (h/o laryngectomy due to  CA); sacral wound Restrictions Weight Bearing Restrictions: No General Chart Reviewed: Yes Family/Caregiver Present: No Home Living/Prior Functioning Home Living Family/patient expects to be discharged to:: Private residence Living Arrangements: Spouse/significant other Available Help at Discharge: Family, Available 24 hours/day Type of Home: House Home Access: Stairs to enter [employer] of Steps: 5 Entrance Stairs-Rails: Right, Left Home Layout: One level Bathroom Shower/Tub: Multimedia programmer: Standard Bathroom Accessibility: Yes Additional Comments: information  taken from [place]; per note wife plans to build ramp and potentially hire caregivers  Lives With: Spouse Prior Function Level of Independence: Requires assistive device for independence, Independent with gait, Independent with transfers(used SPC)  Able to Take Stairs?: Yes Comments: enjoys bluegrass music and watching tv; is a farmer ADL ADL Eating: Moderate assistance Where Assessed-Eating: Chair Grooming: Dependent Where Assessed-Grooming: Bed level Upper Body Bathing: Not assessed Lower Body Bathing: Not assessed Upper Body Dressing: Not assessed Lower Body Dressing: Not assessed Toileting: Not assessed Toilet Transfer: Not assessed Tub/Shower Transfer: Not assessed Vision Baseline Vision/History: Wears glasses Wears Glasses: At all times Patient Visual Report: Other (comment) Vision Assessment?: Vision impaired- to be further tested in functional context(pt with previous heminopsia from prior CVA) Perception  Perception: Impaired Inattention/Neglect: Does not attend to right visual field;Does not attend to right side of body Praxis Praxis: Impaired Praxis Impairment Details: Motor planning Cognition Overall Cognitive Status: Impaired/Different from baseline Arousal/Alertness: Awake/alert Orientation Level: Person;Place;Nonverbal/unable to assess Attention:  Focused Focused Attention: Appears intact Sustained Attention: Appears intact Awareness: Impaired Awareness Impairment: Intellectual impairment Problem Solving: Impaired Problem Solving Impairment: Verbal basic;Functional basic Behaviors: Restless Safety/Judgment: Impaired Comments: Pt unable to verbalize for BIMS and cognitive orientation during eval due to faitgue and decr success with electric larynx Sensation Sensation Light Touch: Impaired Detail Light Touch Impaired Details: Impaired RUE;Impaired RLE Proprioception: Impaired Detail Proprioception Impaired Details: Impaired RLE;Impaired RUE Coordination Gross Motor Movements are Fluid and Coordinated: No Fine Motor Movements are Fluid and Coordinated: No Coordination and Movement Description: demostrates bilateral apraxia - increased apraxia on right  Motor  Motor Motor: Hemiplegia;Motor apraxia;Abnormal postural alignment and control Mobility  Bed Mobility Bed Mobility: Rolling Right;Rolling Left;Sit to Supine;Supine to Sit Rolling Right: 2 Helpers Rolling Left: 2 Helpers Supine to Sit: 2 Helpers Sit to Supine: 2 Helpers  Trunk/Postural Assessment  Cervical Assessment Cervical Assessment: Exceptions to WFL(forward head; decreased R attention) Thoracic Assessment Thoracic Assessment: Exceptions to WFL(kyphotic posture) Lumbar Assessment Lumbar Assessment: Exceptions to WFL(posterior pelvic tilt; increased weightshift to R) Postural Control Postural Control: Deficits on evaluation Trunk Control: impaired; up to total A - heavy R lateral preference Righting Reactions: delayed and inadquate Protective Responses: delayed and inadequate  Balance Balance Balance Assessed: Yes Static Sitting Balance Static Sitting - Level of Assistance: 2: Max assist;4: Min assist(periods of min A but majority of time max A supported on the right ) Dynamic Sitting Balance Dynamic Sitting - Level of Assistance: 1: +2 Total assist Static  Standing Balance Static Standing - Level of Assistance: Not tested (comment)(unsafe on eval) Dynamic Standing Balance Dynamic Standing - Level of Assistance: Not tested (comment)(unsafe on eval) Extremity/Trunk Assessment RUE Assessment RUE Assessment: Exceptions to [facility] Passive Range of Motion (PROM) Comments: ridgity present in shoulder and elbow Active Range of Motion (AROM) Comments: 0-90 General Strength Comments: 2+/5 RUE Body System: Neuro Brunstrum levels for arm and hand: Arm;Hand Brunstrum level for arm: Stage IV Movement is deviating from synergy Brunstrum level for hand: Stage IV Movements deviating from synergies RUE Strength RUE Overall Strength: Deficits RUE Tone RUE Tone: Moderate;Modified Ashworth Modified Ashworth Scale for Grading Hypertonia RUE: More marked increase in muscle tone through most of the ROM, but affected part(s) easily moved LUE Assessment Active Range of Motion (AROM) Comments: WFL General Strength Comments: 4/5 - does demonstrate apraxia      Refer to Care Plan for Long Term Goals  Recommendations for other services: None    Discharge Criteria: Patient will be discharged from OT  if patient refuses treatment 3 consecutive times without medical reason, if treatment goals not met, if there is a change in medical status, if patient makes no progress towards goals or if patient is discharged from hospital.  The above assessment, treatment plan, treatment alternatives and goals were discussed and mutually agreed upon: by patient  Chrisette Man L 03/13/2019, 3:51 PM

## 2019-03-13 NOTE — Discharge Instructions (Signed)
Inpatient Rehab Discharge Instructions  David [name] Discharge date and time: No discharge date for patient encounter.   Activities/Precautions/ Functional Status: Activity: activity as tolerated Diet: diabetic diet Wound Care: keep wound clean and dry Functional status:  ___ No restrictions     ___ Walk up steps independently ___ 24/7 supervision/assistance   ___ Walk up steps with assistance ___ Intermittent supervision/assistance  ___ Bathe/dress independently ___ Walk with walker     _x__ Bathe/dress with assistance ___ Walk Independently    ___ Shower independently ___ Walk with assistance    ___ Shower with assistance ___ No alcohol     ___ Return to work/school ________  Special Instructions:  No smoking driving or alcohol STROKE/TIA DISCHARGE INSTRUCTIONS SMOKING Cigarette smoking nearly doubles your risk of having a stroke & is the single most alterable risk factor  If you smoke or have smoked in the last 12 months, you are advised to quit smoking for your health.  Most of the excess cardiovascular risk related to smoking disappears within a year of stopping.  Ask you doctor about anti-smoking medications  [facility] Quit Line: 1-800-QUIT NOW  Free Smoking Cessation Classes [phone]  CHOLESTEROL Know your levels; limit fat & cholesterol in your diet  Lipid Panel     Component Value Date/Time   CHOL 92 03/06/2019 0222   TRIG 30 03/06/2019 0222   TRIG 85 12/05/2006 1045   HDL 48 03/06/2019 0222   CHOLHDL 1.9 03/06/2019 0222   VLDL 6 03/06/2019 0222   LDLCALC 38 03/06/2019 0222      Many patients benefit from treatment even if their cholesterol is at goal.  Goal: Total Cholesterol (CHOL) less than 160  Goal:  Triglycerides (TRIG) less than 150  Goal:  HDL greater than 40  Goal:  LDL (LDLCALC) less than 100   BLOOD PRESSURE American Stroke Association blood pressure target is less that 120/80 mm/Hg  Your discharge blood pressure is:  BP: 128/85  Monitor  your blood pressure  Limit your salt and alcohol intake  Many individuals will require more than one medication for high blood pressure  DIABETES (A1c is a blood sugar average for last 3 months) Goal HGBA1c is under 7% (HBGA1c is blood sugar average for last 3 months)  Diabetes:    Lab Results  Component Value Date   HGBA1C 6.6 (H) 03/06/2019     Your HGBA1c can be lowered with medications, healthy diet, and exercise.  Check your blood sugar as directed by your physician  Call your physician if you experience unexplained or low blood sugars.  PHYSICAL ACTIVITY/REHABILITATION Goal is 30 minutes at least 4 days per week  Activity: Increase activity slowly, Therapies: Physical Therapy: Home Health Return to work:   Activity decreases your risk of heart attack and stroke and makes your heart stronger.  It helps control your weight and blood pressure; helps you relax and can improve your mood.  Participate in a regular exercise program.  Talk with your doctor about the best form of exercise for you (dancing, walking, swimming, cycling).  DIET/WEIGHT Goal is to maintain a healthy weight  Your discharge diet is:  Diet Order            Diet heart healthy/carb modified Room service appropriate? No; Fluid consistency: Thin  Diet effective now              liquids Your height is:    Your current weight is:   Your Body Mass Index (  BMI) is:     Following the type of diet specifically designed for you will help prevent another stroke.  Your goal weight range is:    Your goal Body Mass Index (BMI) is 19-24.  Healthy food habits can help reduce 3 risk factors for stroke:  High cholesterol, hypertension, and excess weight.  RESOURCES Stroke/Support Group:  Call [phone]   STROKE EDUCATION PROVIDED/REVIEWED AND GIVEN TO PATIENT Stroke warning signs and symptoms How to activate emergency medical system (call 911). Medications prescribed at discharge. Need for follow-up after  discharge. Personal risk factors for stroke. Pneumonia vaccine given:  Flu vaccine given:  My questions have been answered, the writing is legible, and I understand these instructions.  I will adhere to these goals & educational materials that have been provided to me after my discharge from the hospital.     My questions have been answered and I understand these instructions. I will adhere to these goals and the provided educational materials after my discharge from the hospital.  Patient/Caregiver Signature _______________________________ Date __________  Clinician Signature _______________________________________ Date __________  Please bring this form and your medication list with you to all your follow-up doctor's appointments.     Information on my medicine - ELIQUIS (apixaban)  Why was Eliquis prescribed for you? Eliquis was prescribed for you to reduce the risk of a blood clot forming that can cause a stroke if you have a medical condition called atrial fibrillation (a type of irregular heartbeat).  What do You need to know about Eliquis ? Take your Eliquis TWICE DAILY - one tablet in the morning and one tablet in the evening with or without food. If you have difficulty swallowing the tablet whole please discuss with your pharmacist how to take the medication safely.  Take Eliquis exactly as prescribed by your doctor and DO NOT stop taking Eliquis without talking to the doctor who prescribed the medication.  Stopping may increase your risk of developing a stroke.  Refill your prescription before you run out.  After discharge, you should have regular check-up appointments with your healthcare provider that is prescribing your Eliquis.  In the future your dose may need to be changed if your kidney function or weight changes by a significant amount or as you get older.  What do you do if you miss a dose? If you miss a dose, take it as soon as you remember on the same day  and resume taking twice daily.  Do not take more than one dose of ELIQUIS at the same time to make up a missed dose.  Important Safety Information A possible side effect of Eliquis is bleeding. You should call your healthcare provider right away if you experience any of the following: ? Bleeding from an injury or your nose that does not stop. ? Unusual colored urine (red or dark brown) or unusual colored stools (red or black). ? Unusual bruising for unknown reasons. ? A serious fall or if you hit your head (even if there is no bleeding).  Some medicines may interact with Eliquis and might increase your risk of bleeding or clotting while on Eliquis. To help avoid this, consult your healthcare provider or pharmacist prior to using any new prescription or non-prescription medications, including herbals, vitamins, non-steroidal anti-inflammatory drugs (NSAIDs) and supplements.  This website has more information on Eliquis (apixaban): http://[email or web address].com/eliquis/home

## 2019-03-13 NOTE — Progress Notes (Signed)
Social Work  Social Work Assessment and Plan  Patient Details  Name: David [name] MRN: [id number] Date of Birth: 1932-04-10  Today's Date: 03/13/2019  Problem List:  Patient Active Problem List   Diagnosis Date Noted  . Hemiparesis affecting right side as late effect of stroke ([place]) 03/26/2019  . Cerebrovascular accident (CVA) of left thalamus ([place]) 03/16/2019  . CHF (congestive heart failure) ([place])   . History of stroke   . Acute ischemic stroke ([place]) 03/05/2019  . Hematuria 12/21/2018  . Vertebral fracture, osteoporotic ([place]) 08/29/2018  . Advance care planning 03/19/2018  . Hemianopia, homonymous, right   . Acute embolic stroke ([place]) multifocal d/t AF s/p tPA. 12/09/2017  . Loss of peripheral visual field 12/09/2017  . Urinary retention   . Healthcare maintenance 01/01/2017  . Lower urinary tract symptoms (LUTS) 01/01/2017  . Tear of biceps muscle 07/17/2016  . Medicare annual wellness visit, initial 10/31/2012  . Malignant neoplasm of larynx ([place]) 09/22/2007  . Diabetes mellitus ([place]) 02/13/2007  . HLD (hyperlipidemia) 02/13/2007  . Essential hypertension 02/13/2007  . Atrial fibrillation with RVR ([place]) 02/13/2007   Past Medical History:  Past Medical History:  Diagnosis Date  . Cancer of neck ([place])   . History of atrial fibrillation   . Hyperkalemia    with Ramipril dose at 10 mg, K wnl at 5 mg. per day  . Hyperlipidemia   . Hypertension    Pulmonary  . Larynx cancer ([place])   . Right homonymous hemianopsia    [name] 12/09/2017  . Stroke ([place]) 11/2017   peripheral vision loss both eyes/notes 12/09/2017  . Type II diabetes mellitus ([place])   . Urinary retention   . Vertebral fracture, osteoporotic [facility])    Past Surgical History:  Past Surgical History:  Procedure Laterality Date  . AMPUTATION FINGER / THUMB Right    distal amputation x4 fingers (not thumb)   . CATARACT EXTRACTION W/ INTRAOCULAR LENS  IMPLANT, BILATERAL Bilateral 2007  . DOPPLER ECHOCARDIOGRAPHY      Atrial fib EF 60%--01/18/1994, 12/02/02--atrial fib, EF 50-55  . DOPPLER ECHOCARDIOGRAPHY     ra,la,lv,mild dilat.,trace mitral regurg., mild pulm. htn  . PILONIDAL CYST EXCISION    . RADICAL NECK DISSECTION Left ~ 1993  . TOTAL LARYNGECTOMY  ~1993   with left radical neck dissection   Social History:  reports that he quit smoking about 27 years ago. He has never used smokeless tobacco. He reports that he does not drink alcohol or use drugs.  Family / Support Systems Marital Status: Married Patient Roles: Spouse, Parent, Other (Comment)(Farmer) Spouse/Significant Other: Ruby [phone]-home Children: Wife has a son who is a B-amputee and pt has two son's one is in a rehab center himself and his other one has back issues-both worse shape then pt was prior to admission Other Supports: Friends and church member Anticipated Caregiver: Wife and hired assist if needed Ability/Limitations of Caregiver: Wife has health issues-bad back and history of kidney cancer Caregiver Availability: 24/7(Wife will need to hire assist to help her at home with pt's care) Family Dynamics: Close knit with wife and both of their children. Pt was doing well before this and has always met the challenge. Wife voiced they have friends and church members who wil check on them, but not help with assist  Social History Preferred language: English Religion: Baptist Cultural Background: No issues Education: [employer] Read: Yes Write: Yes Employment Status: Retired Date Retired/Disabled/Unemployed: [employer] Legal History/Current Legal Issues: No issues Guardian/Conservator: None-according  to MD pt is not fully capable of making his own decisions while here, will look toward his wife for any decision that needs to be made while here   Abuse/Neglect Abuse/Neglect Assessment Can Be Completed: Unable to assess, patient is non-responsive or altered mental status  Emotional Status Pt's affect, behavior and adjustment  status: According to wif ept has always been able to recover and puhsed himself to be independent again. he was recently in the hospital with a CVA in 11/2018 and went home ambulating and back to mod/i level. She hopes this will be the same but it is different he is worse this time. 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 Recent Psychosocial Issues: other health issues history of laryngeal cancer and recent stroke in [place]  Psychiatric History: No history deferred depression screen due to not able to participate in assessment due to cognitive deficits/speech deficits. Will continue to assess his coping Substance Abuse History: History of tobacco, no other issues  Patient / Family Perceptions, Expectations & Goals Pt/Family understanding of illness & functional limitations: Wife can explain his stroke and she has spoken with the MD's she calls daily to check and talk with nursing staff regarding pt's condition. Wife plans to stay updated daily regarding his condition and improvement this worker will also be available to her also. Premorbid pt/family roles/activities: Husband, father, church member, etc Anticipated changes in roles/activities/participation: resume Pt/family expectations/goals: Wife states: " I hope he does well I know this is different this time, but still am hopeful he will make good progress while here."  [employer]: Other (Comment)(OPPT did not go though) Premorbid Home Care/DME Agencies: Other (Comment)([facility] and [facility]) Transportation available at discharge: Wife Resource referrals recommended: Neuropsychology, Support group (specify)  Discharge Planning Living Arrangements: Spouse/significant other Support Systems: Spouse/significant other, Children, Friends/neighbors, Church/faith community Type of Residence: Private residence Insurance Resources: Multimedia programmer (specify)([employer]) Financial Resources: Social Security, Other (Comment)(farm) Financial Screen Referred: No Living Expenses: Own Money Management: Patient, Spouse Does the patient have any problems obtaining your medications?: No Home Management: Wife, pt was doing the outside work prior to this stroke Patient/Family Preliminary Plans: Return home with wife and possibly hired caregiver. Disucssed with wife he will likely need 24 hr physical care at discharge from rehab. Wife is already in the process of getting a ramp built due to five steps into home.  Sw Barriers to Discharge: Decreased caregiver support, Insurance for SNF coverage Sw Barriers to Discharge Comments: Wife can not provide physical care due to her back issues. Pt has Blue Medicare and they have declined coverage of NH once pt has been to [employer] Social Work Anticipated Follow Up Needs: HH/OP, Support Group  Clinical Impression Pt is not able to participate in my assessment due to aggitation and speech deficits. His wife is involved and willing to hire assist to assist her at discharge with his care. Which she will need to do this. She is having a ramp built for the numerous steps int the home. His children and her son are not in a position to assist, so it is his wife. Will await therapy team evaluations and work on a safe plan for both of them for discharge. Will see if pt progresses to be able to assess him.  Elease Hashimoto 03/13/2019, 2:21 PM

## 2019-03-13 NOTE — Progress Notes (Signed)
Patient continuously removing 02. Leaning off R side of bed.Thick clear mucous observed in mouth. Suctioned. Redirected behaviors. Pulse ox sats ranging 92% to 98% on room air. Obtained 2 separate skin tears to RUE. Cleansed with normal saline and applied allevyn foam. Bed low with call bell in reach. HOB up and mats at bedside for safety.

## 2019-03-13 NOTE — Evaluation (Signed)
Physical Therapy Assessment and Plan  Patient Details  Name: David [name] MRN: [id number] Date of Birth: October 23, 1932  PT Diagnosis: Abnormal posture, Difficulty walking, Edema, Hemiparesis dominant, Impaired cognition, Impaired sensation, Low back pain and Muscle weakness Rehab Potential: Fair ELOS: 21-28 days   Today's Date: 03/13/2019 PT Individual Time: 1300-1350 PT Individual Time Calculation (min): 50 min    Problem List:  Patient Active Problem List   Diagnosis Date Noted  . Hemiparesis affecting right side as late effect of stroke ([place]) 03/15/2019  . Cerebrovascular accident (CVA) of left thalamus ([place]) 03/09/2019  . CHF (congestive heart failure) ([place])   . History of stroke   . Acute ischemic stroke ([place]) 03/05/2019  . Hematuria 12/21/2018  . Vertebral fracture, osteoporotic ([place]) 08/29/2018  . Advance care planning 03/19/2018  . Hemianopia, homonymous, right   . Acute embolic stroke ([place]) multifocal d/t AF s/p tPA. 12/09/2017  . Loss of peripheral visual field 12/09/2017  . Urinary retention   . Healthcare maintenance 01/01/2017  . Lower urinary tract symptoms (LUTS) 01/01/2017  . Tear of biceps muscle 07/17/2016  . Medicare annual wellness visit, initial 10/31/2012  . Malignant neoplasm of larynx ([place]) 09/22/2007  . Diabetes mellitus ([place]) 02/13/2007  . HLD (hyperlipidemia) 02/13/2007  . Essential hypertension 02/13/2007  . Atrial fibrillation with RVR ([place]) 02/13/2007    Past Medical History:  Past Medical History:  Diagnosis Date  . Cancer of neck ([place])   . History of atrial fibrillation   . Hyperkalemia    with Ramipril dose at 10 mg, K wnl at 5 mg. per day  . Hyperlipidemia   . Hypertension    Pulmonary  . Larynx cancer ([place])   . Right homonymous hemianopsia    [name] 12/09/2017  . Stroke ([place]) 11/2017   peripheral vision loss both eyes/notes 12/09/2017  . Type II diabetes mellitus ([place])   . Urinary retention   . Vertebral fracture, osteoporotic  [facility])    Past Surgical History:  Past Surgical History:  Procedure Laterality Date  . AMPUTATION FINGER / THUMB Right    distal amputation x4 fingers (not thumb)   . CATARACT EXTRACTION W/ INTRAOCULAR LENS  IMPLANT, BILATERAL Bilateral 2007  . DOPPLER ECHOCARDIOGRAPHY     Atrial fib EF 60%--01/18/1994, 12/02/02--atrial fib, EF 50-55  . DOPPLER ECHOCARDIOGRAPHY     ra,la,lv,mild dilat.,trace mitral regurg., mild pulm. htn  . PILONIDAL CYST EXCISION    . RADICAL NECK DISSECTION Left ~ 1993  . TOTAL LARYNGECTOMY  ~1993   with left radical neck dissection    Assessment & Plan Clinical Impression: Patient is an 85 year old right-handed male history of diabetes mellitus, laryngeal cancer status post laryngectomy, prior right PCA infarction with right homonymous hemianopsia as well as cognitive impairment, atrial fibrillation not on anticoagulation due to bouts of hematuria. Per chart review patient lives with spouse. Independent with assistive device using a straight cane. One level home. Presented 03/05/2019 after being found down on the floor with right-sided weakness and facial droop. Blood pressure 184/182. CT of the head showed small area of acute infarction left medial cerebellum. CT angiogram of head and neck occlusion of the left posterior cerebral artery at the proximal P2 segment area left PICA occlusion. Patient did not receive TPA. MRI the brain showed multifocal bilateral posterior circulation acute ischemic infarctions involving the left thalamus both medial temporal lobes in the left cerebellum. No hemorrhage or mass effect. Echocardiogram with ejection fraction of 45% mild to moderate reduced systolic function. Inferior basal  wall hypokinesis. Neurology service is consulted maintained on Eliquis after being cleared by urology services for noted history of hematuria. Urine culture greater than 100,000 Klebsiella oxytoca and completing a course of Rocephin.patient noted history of laryngeal  cancer with laryngectomy noted fair amount of copious secretions latest chest x-ray showed patchy bibasilar opacities, no consolidation or pneumonia. Tolerating a regular diet. Therapy evaluations completed and patient was admitted for a comprehensive rehabilitation program  Patient transferred to CIR on 03/05/2019 .   Patient currently requires total assist +2 with mobility secondary to muscle weakness, muscle joint tightness and muscle paralysis, decreased cardiorespiratoy endurance and decreased oxygen support, impaired timing and sequencing, unbalanced muscle activation, motor apraxia, decreased coordination and decreased motor planning, decreased midline orientation and decreased attention to right, decreased initiation, decreased attention, decreased awareness, decreased problem solving, decreased safety awareness and decreased memory and decreased sitting balance, decreased standing balance, decreased postural control, hemiplegia and decreased balance strategies.  Prior to hospitalization, patient was modified independent  with mobility and lived with Spouse in a House home.  Home access is 5Stairs to enter.  Patient will benefit from skilled PT intervention to maximize safe functional mobility, minimize fall risk and decrease caregiver burden for planned discharge home with 24 hour assist.  Anticipate patient will benefit from follow up [facility] at discharge.  PT - End of Session Activity Tolerance: Decreased this session Endurance Deficit: Yes PT Assessment Rehab Potential (ACUTE/IP ONLY): Fair PT Barriers to Discharge: Inaccessible home environment;Decreased caregiver support;Incontinence;Medical stability PT Barriers to Discharge Comments: recommend ramp for d/c; will need hired cargivers. per chart, wife only able to provide supervision level assist PT Patient demonstrates impairments in the following area(s): Balance;Edema;Endurance;Motor;Pain;Perception;Safety;Sensory;Skin Integrity PT Transfers  Functional Problem(s): Bed Mobility;Bed to Chair;Car;Furniture PT Locomotion Functional Problem(s): Ambulation;Wheelchair Mobility;Stairs PT Plan PT Intensity: Minimum of 1-2 x/day ,45 to 90 minutes PT Frequency: 5 out of 7 days(likely may need to decrease to 15/7 next week) PT Duration Estimated Length of Stay: 21-28 days PT Treatment/Interventions: Ambulation/gait training;Balance/vestibular training;Cognitive remediation/compensation;Discharge planning;Disease management/prevention;DME/adaptive equipment instruction;Functional mobility training;Neuromuscular re-education;Pain management;Patient/family education;Psychosocial support;Skin care/wound management;Splinting/orthotics;Therapeutic Activities;Therapeutic Exercise;UE/LE Strength taining/ROM;UE/LE Coordination activities;Visual/perceptual remediation/compensation;Wheelchair propulsion/positioning PT Transfers Anticipated Outcome(s): mod assist basic transfers w/c level PT Locomotion Anticipated Outcome(s): min assist w/c mobility PT Recommendation Recommendations for Other Services: Therapeutic Recreation consult Follow Up Recommendations: Home health PT;24 hour supervision/assistance Patient destination: Home Equipment Recommended: Wheelchair (measurements);Wheelchair cushion (measurements);To be determined  Skilled Therapeutic Intervention Evaluation completed (see details above and below) with education on PT POC and goals and individual treatment initiated with focus on goal to get OOB for improved activity tolerance, postural control re-training, reorientation to midline and addressing R inattention, and functional use of voice stimulator for improved communication. Due to pt unable to tolerate supine position due to respiratory status, utilized maxi move (total+2 for forward lean from upright position for sling placement) for transfer into TIS w/c. ROHO cushion placed in w/c for pressure relief and improved sitting tolerance due to  high risk of skin breakdown and sacral wound. Focused on postural control retraining and R attention in the w/c while engaging in self feeding with verbal and tactile cues. Pt able to perform with extra time and the above mentioned cues. Pt able to initiate correction of midline orientation with verbal cues with supervision to min assist but decreased awareness once no longer able to maintain. In bed, requires up to +2 assist to reposition as maintains strong R lateral lean/flexion.    PT Evaluation Precautions/Restrictions Precautions Precautions: Fall;Other (  comment) Precaution Comments: R inattention; R hemianopsia from old CVA; uses vocal stimulator for communication; trach collar for supplemental O2 over stoma site (h/o laryngectomy due to CA); sacral wound Restrictions Weight Bearing Restrictions: No   Vital Signs BP = 129/83 mmHg  HR = 89 bpm Sats on trach collar = >93%  When transferred to w/c  Pain  c/o pain with movement in back/LE area - difficult for pt to communicate location. Repositioned. Home Living/Prior Functioning Home Living Living Arrangements: Spouse/significant other Available Help at Discharge: Family;Available 24 hours/day Type of Home: House Home Access: Stairs to enter [employer] of Steps: 5 Entrance Stairs-Rails: Right;Left Home Layout: One level Bathroom Shower/Tub: Multimedia programmer: Standard Bathroom Accessibility: Yes Additional Comments: information taken from Pre-admissions; per note wife plans to build ramp and potentially hire caregivers  Lives With: Spouse Prior Function Level of Independence: Requires assistive device for independence;Independent with gait;Independent with transfers(used SPC)  Able to Take Stairs?: Yes Comments: enjoys bluegrass music and watching tv; is a farmer Vision/Perception  Vision - History Baseline Vision: Wears glasses all the time Perception Perception: Impaired Inattention/Neglect:  Does not attend to right visual field;Does not attend to right side of body Praxis Praxis: Impaired  Cognition Overall Cognitive Status: Impaired/Different from baseline Focused Attention: Appears intact Sustained Attention: Appears intact Awareness: Impaired Awareness Impairment: Intellectual impairment Problem Solving: Impaired Behaviors: Restless Safety/Judgment: Impaired Sensation Sensation Light Touch: Impaired Detail Light Touch Impaired Details: Impaired RLE Proprioception: Impaired Detail Proprioception Impaired Details: Impaired RLE;Impaired RUE Coordination Gross Motor Movements are Fluid and Coordinated: No Fine Motor Movements are Fluid and Coordinated: No Motor  Motor Motor: Hemiplegia;Motor apraxia;Abnormal postural alignment and control  Mobility Bed Mobility Bed Mobility: Not assessed(pt unable to tolerate supine d/t desatting; per OT total +2) Transfers Transfers: Transfer via Public house manager via Lift Equipment: Restaurant manager, fast food / Additional Locomotion Stairs: No(unsafe on eval) Product manager Mobility: No(using TIS w/c at this time for positioning)  Trunk/Postural Assessment  Cervical Assessment Cervical Assessment: Exceptions to WFL(forward head; decreased R attention) Thoracic Assessment Thoracic Assessment: Exceptions to WFL(kyphotic posture) Lumbar Assessment Lumbar Assessment: Exceptions to WFL(posterior pelvic tilt; increased weightshift to R) Postural Control Postural Control: Deficits on evaluation Trunk Control: impaired; up to total A - heavy R lateral preference Righting Reactions: delayed and inadquate Protective Responses: delayed and inadequate  Balance Balance Balance Assessed: Yes Static Sitting Balance Static Sitting - Level of Assistance: 2: Max assist;4: Min assist(per other therapist; able to maintain min for brief periods ) Dynamic Sitting Balance Dynamic Sitting - Level of Assistance: 1: +2  Total assist Static Standing Balance Static Standing - Level of Assistance: Not tested (comment)(unsafe on eval) Dynamic Standing Balance Dynamic Standing - Level of Assistance: Not tested (comment)(unsafe on eval) Extremity Assessment      RLE Assessment RLE Assessment: Exceptions to WFL(edema noted in BLE) General Strength Comments: no active movement on command LLE Assessment LLE Assessment: Exceptions to WFL(edema in BLE) General Strength Comments: grossly able to move against gravity; unable to assess with formal MMT    Refer to Care Plan for Long Term Goals  Recommendations for other services: Therapeutic Recreation  Other co-tret  Discharge Criteria: Patient will be discharged from PT if patient refuses treatment 3 consecutive times without medical reason, if treatment goals not met, if there is a change in medical status, if patient makes no progress towards goals or if patient is discharged from hospital.  The above assessment, treatment plan, treatment alternatives and  goals were discussed and mutually agreed upon: by patient      Treatment #2: 1320-1340 (20 min) Individual Therapy No complaints of pain currently. Request to return back to bed. Assisted pt with repositioning in the w/c as becoming restless and initiating leaning forward to get up with max assist to reorient to midline. Used maxi move with +2 assist to return back to bed with HOB elevated position. Requires +2 assist for removal of sling from pt (from upright position) due to pt unable to tolerate laying flat.(pt with incontinent BM during transfer - pt denies need for further BM). Cues for attention to R during repositioning. PT placed pillows for RUE support and improved postural alignment as pt maintains trunk flexed to R. Handoff to RN and NT in the room.    Canary Brim Ivory Broad, PT, DPT, CBIS  03/13/2019, 3:12 PM

## 2019-03-13 NOTE — Evaluation (Signed)
Speech Language Pathology Assessment and Plan  Patient Details  Name: David [name] MRN: [id number] Date of Birth: 01/09/1932  SLP Diagnosis: Speech and Language deficits;Cognitive Impairments  Rehab Potential: Fair ELOS: 21-28 days    Today's Date: 03/13/2019 SLP Individual Time: 0915-1010 SLP Individual Time Calculation (min): 55 min   Problem List:  Patient Active Problem List   Diagnosis Date Noted  . Hemiparesis affecting right side as late effect of stroke ([place]) 03/10/2019  . Cerebrovascular accident (CVA) of left thalamus ([place]) 03/07/2019  . CHF (congestive heart failure) ([place])   . History of stroke   . Acute ischemic stroke ([place]) 03/05/2019  . Hematuria 12/21/2018  . Vertebral fracture, osteoporotic ([place]) 08/29/2018  . Advance care planning 03/19/2018  . Hemianopia, homonymous, right   . Acute embolic stroke ([place]) multifocal d/t AF s/p tPA. 12/09/2017  . Loss of peripheral visual field 12/09/2017  . Urinary retention   . Healthcare maintenance 01/01/2017  . Lower urinary tract symptoms (LUTS) 01/01/2017  . Tear of biceps muscle 07/17/2016  . Medicare annual wellness visit, initial 10/31/2012  . Malignant neoplasm of larynx ([place]) 09/22/2007  . Diabetes mellitus ([place]) 02/13/2007  . HLD (hyperlipidemia) 02/13/2007  . Essential hypertension 02/13/2007  . Atrial fibrillation with RVR ([place]) 02/13/2007   Past Medical History:  Past Medical History:  Diagnosis Date  . Cancer of neck ([place])   . History of atrial fibrillation   . Hyperkalemia    with Ramipril dose at 10 mg, K wnl at 5 mg. per day  . Hyperlipidemia   . Hypertension    Pulmonary  . Larynx cancer ([place])   . Right homonymous hemianopsia    [name] 12/09/2017  . Stroke ([place]) 11/2017   peripheral vision loss both eyes/notes 12/09/2017  . Type II diabetes mellitus ([place])   . Urinary retention   . Vertebral fracture, osteoporotic [facility])    Past Surgical History:  Past Surgical History:  Procedure Laterality  Date  . AMPUTATION FINGER / THUMB Right    distal amputation x4 fingers (not thumb)   . CATARACT EXTRACTION W/ INTRAOCULAR LENS  IMPLANT, BILATERAL Bilateral 2007  . DOPPLER ECHOCARDIOGRAPHY     Atrial fib EF 60%--01/18/1994, 12/02/02--atrial fib, EF 50-55  . DOPPLER ECHOCARDIOGRAPHY     ra,la,lv,mild dilat.,trace mitral regurg., mild pulm. htn  . PILONIDAL CYST EXCISION    . RADICAL NECK DISSECTION Left ~ 1993  . TOTAL LARYNGECTOMY  ~1993   with left radical neck dissection    Assessment / Plan / Recommendation Clinical Impression Patient is an 85 year old right-handed male history of diabetes mellitus, laryngeal cancer status post laryngectomy, prior right PCA infarction with right homonymous hemianopsia as well as cognitive impairment, atrial fibrillation not on anticoagulation due to bouts of hematuria. Per chart review patient lives with spouse. Independent with assistive device using a straight cane. One level home. Presented 03/05/2019 after being found down on the floor with right-sided weakness and facial droop. Blood pressure 184/182. CT of the head showed small area of acute infarction left medial cerebellum. CT angiogram of head and neck occlusion of the left posterior cerebral artery at the proximal P2 segment area left PICA occlusion. Patient did not receive TPA. MRI the brain showed multifocal bilateral posterior circulation acute ischemic infarctions involving the left thalamus both medial temporal lobes in the left cerebellum. No hemorrhage or mass effect. Echocardiogram with ejection fraction of 45% mild to moderate reduced systolic function. Inferior basal wall hypokinesis. Neurology service is consulted maintained on Eliquis  after being cleared by urology services for noted history of hematuria. Urine culture greater than 100,000 Klebsiella oxytoca and completing a course of Rocephin.patient noted history of laryngeal cancer with laryngectomy noted fair amount of copious secretions  latest chest x-ray showed patchy bibasilar opacities, no consolidation or pneumonia. Tolerating a regular diet. Therapy evaluations completed and patient was admitted for a comprehensive rehabilitation program 03/18/2019.  Patient demonstrates moderate communication deficits which are exacerbated by cognitive impairments. Patient's speech intelligibility is impacted by patient's decreased ability for appropriate placement of electrolarynx due to UE weakness and cognitive impairments. Mild language deficits were also noted but difficult to differentiate language of confusion vs impaired speech intelligibility vs aphasia. Therefore, continued diagnostic treatment is needed. Moderate-severe cognitive impairments were also noted due to impaired orientation, attention, attention to right field of environment, problem solving and awareness which impacts his safety with functional tasks. Safety is also impacted by impulsivity and restlessness. Patient would benefit from skilled SLP intervention to maximize his cognitive functioning and communication prior to discharge.    Skilled Therapeutic Interventions          Administered a cognitive-linguistic evaluation, please see above for details. Educated patient and wife (via telephone) in regards to current cognitive-linguistic and communication impairments and goals of skilled SLP intervention. Patient's wife verbalized understanding. Throughout session, patient required Max hand over hand assist for placement of electrolarynx to maximize intelligibility and to not remove oxygen due to restlessness. Patient handed off to OT.   SLP Assessment  Patient will need skilled [facility] Services during CIR admission    Recommendations  Oral Care Recommendations: Oral care BID Patient destination: Home Follow up Recommendations: 24 hour supervision/assistance;Home Health SLP Equipment Recommended: None recommended by SLP    SLP Frequency 3 to 5 out of 7  days   SLP Duration  SLP Intensity  SLP Treatment/Interventions 21-28 days  Minumum of 1-2 x/day, 30 to 90 minutes  Cognitive remediation/compensation;Environmental controls;Internal/external aids;Speech/Language facilitation;Therapeutic Activities;Patient/family education;Functional tasks;Cueing hierarchy    Pain Patient grimacing while sitting EOB, suspect due to back pain  Prior Functioning Type of Home: House  Lives With: Spouse Available Help at Discharge: Family;Available 24 hours/day  Short Term Goals: Week 1: SLP Short Term Goal 1 (Week 1): Patient will maximize speech intelligibility to 50% at the phrase level with Mod A multimodal cues for use of strategies and placement of electrolarynx SLP Short Term Goal 2 (Week 1): Patient will verbalize wants/needs at the phrase level with Mod A verbal cues for word-finding.  SLP Short Term Goal 3 (Week 1): Patient will demonstrate sustained attention to functional tasks for 5 minutes with Mod A verbal cues.  SLP Short Term Goal 4 (Week 1): Patient will demonstrate orientation to place, time, and situation with Mod A multimodal cues.  SLP Short Term Goal 5 (Week 1): Patient will attend to right field of enviornment during functional tasks with Mod A multimodal cues.   Refer to Care Plan for Long Term Goals  Recommendations for other services: None   Discharge Criteria: Patient will be discharged from SLP if patient refuses treatment 3 consecutive times without medical reason, if treatment goals not met, if there is a change in medical status, if patient makes no progress towards goals or if patient is discharged from hospital.  The above assessment, treatment plan, treatment alternatives and goals were discussed and mutually agreed upon: by patient and by family  Treylin Burtch 03/13/2019, 6:02 PM

## 2019-03-13 NOTE — Progress Notes (Signed)
Patient information reviewed and entered into eRehab System by Becky Caidence Kaseman, PPS coordinator. Information including medical coding, function ability, and quality indicators will be reviewed and updated through discharge.   

## 2019-03-13 NOTE — Care Management Note (Signed)
[facility] Individual Statement of Services  Patient Name:  David [name]  Date:  03/13/2019  Welcome to the [facility].  Our goal is to provide you with an individualized program based on your diagnosis and situation, designed to meet your specific needs.  With this comprehensive rehabilitation program, you will be expected to participate in at least 3 hours of rehabilitation therapies Monday-Friday, with modified therapy programming on the weekends.  Your rehabilitation program will include the following services:  Physical Therapy (PT), Occupational Therapy (OT), Speech Therapy (ST), 24 hour per day rehabilitation nursing, Case Management (Social Worker), Rehabilitation Medicine, Nutrition Services and Pharmacy Services  Weekly team conferences will be held on Wednesday to discuss your progress.  Your Social Worker will talk with you frequently to get your input and to update you on team discussions.  Team conferences with you and your family in attendance may also be held.  Expected length of stay: 4 weeks Overall anticipated outcome: mod assist wheelchair level  Depending on your progress and recovery, your program may change. Your Social Worker will coordinate services and will keep you informed of any changes. Your Social Worker's name and contact numbers are listed  below.  The following services may also be recommended but are not provided by the [facility]:    [name] will be made to provide these services after discharge if needed.  Arrangements include referral to agencies that provide these services.  Your insurance has been verified to be:  [employer] Your primary doctor is:  Elsie Stain  Pertinent information will be shared with your doctor and your insurance company.  Social Worker:  Ovidio Kin, [place] or ([phone]  Information discussed with and copy given to patient by: Elease Hashimoto, 03/13/2019, 10:54 AM

## 2019-03-13 NOTE — Progress Notes (Signed)
Michel Santee, PT  Rehab Admission Coordinator  Physical Medicine and Rehabilitation  PMR Pre-admission  Signed  Date of Service:  03/26/2019 3:09 PM       Related encounter: ED to Hosp-Admission (Discharged) from 03/05/2019 in [facility]      Signed         Show:Clear all '[x]' Manual'[x]' Template'[x]' Copied  Added by: '[x]' Michel Santee, PT  '[]' Hover for details PMR Admission Coordinator Pre-Admission Assessment  Patient: David [name] is an 85 y.o., male MRN: [id number] DOB: 07-29-1932 Height: '6\' 3"'  (190.5 cm) Weight: 104 kg                                                                                                                                                  Insurance Information HMO: yes    PPO:      PCP:      IPA:      80/20:      OTHER:  PRIMARY: BCBS Medicare      Policy#: [id number]      Subscriber: patient CM Name: Cedric      Phone#: [phone]     Fax#: [phone] Pre-Cert#: [id number]      Employer:  Benefits:  Phone #: [phone]     Name:  Eff. Date: 11/26/2018     Deduct: $0      Out of Pocket Max: $3900 (met $139.95)      Life Max: n/a CIR: $310/day 1-6, $0/day 7+      SNF: $0/day 1-20, $178/day 21-60, $0/day 61-100 Outpatient: $40/visit     Co-Pay:  Home Health: 100%      Co-Pay:  DME: 80%     Co-Pay: 20%  SECONDARY:       Policy#:       Subscriber:  CM Name:       Phone#:      Fax#:  Pre-Cert#:       Employer:  Benefits:  Phone #:      Name:  Eff. Date:      Deduct:       Out of Pocket Max:       Life Max:  CIR:       SNF:  Outpatient:      Co-Pay:  Home Health:       Co-Pay:  DME:      Co-Pay:   Medicaid Application Date:       Case Manager:  Disability Application Date:       Case Worker:   The "Data Collection Information Summary" for patients in Inpatient Rehabilitation Facilities with attached "Privacy Act [place] Records" was provided and verbally reviewed with: Family  Emergency Contact  Information         Contact Information    Name Relation Home Work Mobile   [place] L Spouse [phone]  Current Medical History  Patient Admitting Diagnosis: B CVA  History of Present Illness: David [name] is an 85 year old right-handed male history of diabetes mellitus, laryngeal cancer status post laryngectomy, prior right PCA infarction with right homonymous hemianopsia as well as cognitive impairment, atrial fibrillation not on anticoagulation due to bouts of hematuria. Presented 03/05/2019 after being found down on the floor with right-sided weakness and facial droop. Blood pressure 184/182. CT of the head showed small area of acute infarction left medial cerebellum. CT angiogram of head and neck occlusion of the left posterior cerebral artery at the proximal P2 segment area left PICA occlusion. Patient did not receive TPA. MRI the brain showed multifocal bilateral posterior circulation acute ischemic infarctions involving the left thalamus both medial temporal lobes in the left cerebellum. No hemorrhage or mass effect. Echocardiogram with ejection fraction of 45% mild to moderate reduced systolic function. Inferior basal wall hypokinesis. Neurology service is consulted maintained on Eliquis after being cleared by urology services for noted history of hematuria. Urine culture greater than 100,000 Klebsiella oxytoca and completing a course of Rocephin.patient noted history of laryngeal cancer with laryngectomy noted fair amount of copious secretions latest chest x-ray showed patchy bibasilar opacities, no consolidation or pneumonia. Tolerating a regular diet.   Complete NIHSS TOTAL: 13  Past Medical History      Past Medical History:  Diagnosis Date  . Cancer of neck ([place])   . History of atrial fibrillation   . Hyperkalemia    with Ramipril dose at 10 mg, K wnl at 5 mg. per day  . Hyperlipidemia   . Hypertension    Pulmonary  . Larynx cancer ([place])   .  Right homonymous hemianopsia    [name] 12/09/2017  . Stroke ([place]) 11/2017   peripheral vision loss both eyes/notes 12/09/2017  . Type II diabetes mellitus ([place])   . Urinary retention   . Vertebral fracture, osteoporotic ([facility])     Family History  family history includes Cirrhosis in his father.  Prior Rehab/Hospitalizations:  Has the patient had prior rehab or hospitalizations prior to admission? No  Has the patient had major surgery during 100 days prior to admission? No  Current Medications   Current Facility-Administered Medications:  .   stroke: mapping our early stages of recovery book, , Does not apply, Once, Aroor, Karena Addison R, MD .  0.9 %  sodium chloride infusion, , Intravenous, Continuous, Rosalin Hawking, MD, Last Rate: 40 mL/hr at 03/11/19 0728 .  acetaminophen (TYLENOL) tablet 650 mg, 650 mg, Oral, Q4H PRN **OR** acetaminophen (TYLENOL) solution 650 mg, 650 mg, Per Tube, Q4H PRN **OR** acetaminophen (TYLENOL) suppository 650 mg, 650 mg, Rectal, Q4H PRN, Aroor, Karena Addison R, MD .  apixaban (ELIQUIS) tablet 5 mg, 5 mg, Oral, BID, Skeet Simmer, RPH, 5 mg at 03/26/2019 9702 .  cefTRIAXone (ROCEPHIN) 1 g in sodium chloride 0.9 % 100 mL IVPB, 1 g, Intravenous, Q24H, Biby, Sharon L, NP, Last Rate: 200 mL/hr at 03/11/19 1441, 1 g at 03/11/19 1441 .  feeding supplement (ENSURE ENLIVE) (ENSURE ENLIVE) liquid 237 mL, 237 mL, Oral, BID BM, Leonie Man, Pramod S, MD, 237 mL at 03/11/19 1441 .  haloperidol lactate (HALDOL) injection 2 mg, 2 mg, Intravenous, Q8H PRN, Rosalin Hawking, MD, 2 mg at 03/09/19 0159 .  insulin aspart (novoLOG) injection 0-9 Units, 0-9 Units, Subcutaneous, TID WC, Rosalin Hawking, MD, 2 Units at 03/26/2019 1208 .  metoprolol tartrate (LOPRESSOR) tablet 12.5 mg, 12.5 mg, Oral, BID, Rosalin Hawking, MD, 12.5 mg  at 03/15/2019 0907 .  pantoprazole (PROTONIX) EC tablet 40 mg, 40 mg, Oral, Daily, Rosalin Hawking, MD, 40 mg at 02/25/2019 0907 .  QUEtiapine (SEROQUEL) tablet 25 mg, 25 mg, Oral,  QHS, Garvin Fila, MD, 25 mg at 03/11/19 2150 .  senna-docusate (Senokot-S) tablet 1 tablet, 1 tablet, Oral, QHS PRN, Aroor, Karena Addison R, MD .  simvastatin (ZOCOR) tablet 20 mg, 20 mg, Oral, QHS, Biby, Sharon L, NP, 20 mg at 03/11/19 2150 .  tamsulosin (FLOMAX) capsule 0.4 mg, 0.4 mg, Oral, BID, Rosalin Hawking, MD, 0.4 mg at 03/05/2019 0907  Patients Current Diet:     Diet Order                  Diet heart healthy/carb modified Room service appropriate? No; Fluid consistency: Thin  Diet effective now               Precautions / Restrictions Precautions Precautions: Fall Restrictions Weight Bearing Restrictions: No   Has the patient had 2 or more falls or a fall with injury in the past year?Yes  Prior Activity Level Community (5-7x/wk): very independent up until fall in July, has been more limited by back pain since that time  Prior Functional Level Prior Function Level of Independence: Independent with assistive device(s) Comments: reports intermittent use of SPC  Self Care: Did the patient need help bathing, dressing, using the toilet or eating?  Independent  Indoor Mobility: Did the patient need assistance with walking from room to room (with or without device)? Independent  Stairs: Did the patient need assistance with internal or external stairs (with or without device)? Independent  Functional Cognition: Did the patient need help planning regular tasks such as shopping or remembering to take medications? Independent  Home Assistive Devices / Equipment Home Assistive Devices/Equipment: Bedside commode/3-in-1, Cane (specify quad or straight)  Prior Device Use: Indicate devices/aids used by the patient prior to current illness, exacerbation or injury? cane  Current Functional Level Cognition  Arousal/Alertness: Awake/alert Overall Cognitive Status: Impaired/Different from baseline Current Attention Level: Sustained Orientation Level: Oriented to  person, Disoriented to place, Disoriented to situation, Disoriented to time Following Commands: Follows one step commands inconsistently, Follows one step commands with increased time Safety/Judgement: Decreased awareness of safety, Decreased awareness of deficits General Comments: pt following approx 50% commands, require cueing to attend to R side of body and visual field; tries to mouth word and nod head during session; decreased impulsivity today  Attention: Focused, Sustained Focused Attention: Appears intact Sustained Attention: Appears intact Awareness: Impaired Awareness Impairment: Intellectual impairment Problem Solving: Impaired Problem Solving Impairment: Verbal complex(1/3)    Extremity Assessment (includes Sensation/Coordination)  Upper Extremity Assessment: RUE deficits/detail RUE Deficits / Details: some ataxic movements noted; discoordination; pt with amputated digits (baseline)   RUE Coordination: decreased fine motor  Lower Extremity Assessment: Defer to PT evaluation RLE Deficits / Details: No overt focal weaknesses, hip flexors 2+/3-/5, quad 4-, hams >4, df/pf >3/5 RLE Coordination: decreased fine motor, decreased gross motor LLE Deficits / Details: hip flexor weakest at 2+/3_/5, o/w 4/5  L more coordinated than Right, but some incoordination.    ADLs  Overall ADL's : Needs assistance/impaired Eating/Feeding: Set up, Supervision/ safety, Sitting Eating/Feeding Details (indicate cue type and reason): requires support for sitting Grooming: Wash/dry face, Sitting, Minimal assistance, Oral care Grooming Details (indicate cue type and reason): oral care with min assist using suction swab, requires assist to initate washing face but able to complete task, perseverates on task  Upper  Body Bathing: Minimal assistance, Sitting Lower Body Bathing: Moderate assistance, +2 for physical assistance, +2 for safety/equipment, Sit to/from stand Upper Body Dressing : Sitting,  Minimal assistance Lower Body Dressing: +2 for physical assistance, Maximal assistance, +2 for safety/equipment, Sit to/from stand Lower Body Dressing Details (indicate cue type and reason): modA +2 for standing balance Toilet Transfer: Moderate assistance, +2 for physical assistance, +2 for safety/equipment, Stand-pivot Toilet Transfer Details (indicate cue type and reason): deferred due to safety/fatigue Toileting- Clothing Manipulation and Hygiene: Maximal assistance, +2 for physical assistance, +2 for safety/equipment, Sit to/from stand Functional mobility during ADLs: +2 for physical assistance, +2 for safety/equipment, Total assistance(limited to EOB) General ADL Comments: pt continues to be limited by cognition, coordination, balance and act tolerance    Mobility  Overal bed mobility: Needs Assistance Bed Mobility: Supine to Sit, Sit to Supine Supine to sit: +2 for physical assistance, Total assist Sit to supine: +2 for physical assistance, Total assist General bed mobility comments: multimodal cues for sequencing and hand over hand assist for using bed rail; assist for all aspects of bed mobility     Transfers  Overall transfer level: Needs assistance Equipment used: 2 person hand held assist Transfers: Sit to/from Stand Sit to Stand: +2 physical assistance, Max assist Stand pivot transfers: Mod assist, +2 physical assistance, +2 safety/equipment General transfer comment: deferred due to safety/fatigue     Ambulation / Gait / Stairs / Wheelchair Mobility  Ambulation/Gait Ambulation/Gait assistance: Mod assist Gait Distance (Feet): 15 Feet(to toilet then additional 40 feet) Assistive device: IV Pole, 1 person hand held assist Gait Pattern/deviations: Step-through pattern General Gait Details: unable to get pt upright enough to safely ambulate Gait velocity interpretation: <1.31 ft/sec, indicative of household ambulator    Posture / Balance Dynamic Sitting Balance  Sitting balance - Comments: sitting EOB with mod-minA (at best) while engaging in functional ADLs using L UE  Balance Overall balance assessment: Needs assistance Sitting-balance support: Single extremity supported, Feet supported, Bilateral upper extremity supported Sitting balance-Leahy Scale: Poor Sitting balance - Comments: sitting EOB with mod-minA (at best) while engaging in functional ADLs using L UE  Postural control: Right lateral lean Standing balance-Leahy Scale: Zero Standing balance comment: needs external support    Special needs/care consideration BiPAP/CPAP no CPM no Continuous Drip IV Rocephin Dialysis no        Days n/a Life Vest no Oxygen 5L per min via trach collar Special Bed no Trach Size n/a Wound Vac (area) no      Location n/a Skin ecchymosis to BUEs, skin tear to buttocks and BUEs                               Bowel mgmt: incontinent, last BM 03/09/2019 Bladder mgmt: incontintent, foley Diabetic mgmt yes, oral medications Behavioral consideration  no Chemo/radiation  no     Previous Home Environment (from acute therapy documentation) Living Arrangements: Spouse/significant other  Lives With: Spouse Available Help at Discharge: Family, Available 24 hours/day Type of Home: House Home Layout: One level Home Access: Stairs to enter Entrance Stairs-Rails: Right, Left Entrance Stairs-Number of Steps: 5 Bathroom Shower/Tub: Multimedia programmer: Standard Bathroom Accessibility: Yes How Accessible: Accessible via walker  Discharge Living Setting Plans for Discharge Living Setting: Patient's home Type of Home at Discharge: House Discharge Home Layout: One level Discharge Home Access: Stairs to enter Entrance Stairs-Rails: Can reach both Entrance Stairs-Number of Steps: 5(wife plans to have  ramp built) Discharge Bathroom Shower/Tub: Walk-in shower Discharge Bathroom Toilet: Standard Discharge Bathroom Accessibility: Yes How  Accessible: Accessible via walker Does the patient have any problems obtaining your medications?: No  Social/Family/Support Systems Patient Roles: Spouse Anticipated Caregiver: wife Bertram Millard, and hired caregivers if needed Anticipated Ambulance person Information: Bertram Millard [phone] Ability/Limitations of Caregiver: has a bad back, can only provide supervision Caregiver Availability: 24/7 Discharge Plan Discussed with Primary Caregiver: Yes Is Caregiver In Agreement with Plan?: Yes Does Caregiver/Family have Issues with Lodging/Transportation while Pt is in Rehab?: No   Goals/Additional Needs Patient/Family Goal for Rehab: PT/OT/SLP, min to mod assist Expected length of stay: 20-27 days Cultural Considerations: none Dietary Needs: heart healthy/carb modified, thin Equipment Needs: tbd Pt/Family Agrees to Admission and willing to participate: Yes Program Orientation Provided & Reviewed with Pt/Caregiver Including Roles  & Responsibilities: Yes   Possible need for SNF placement upon discharge: not anticipated   Patient Condition: This patient's medical and functional status has changed since the consult dated: 03/09/2019 in which the Rehabilitation Physician determined and documented that the patient's condition is appropriate for intensive rehabilitative care in an inpatient rehabilitation facility. See "History of Present Illness" (above) for medical update. Functional changes are: max/total assist. Patient's medical and functional status update has been discussed with the Rehabilitation physician and patient remains appropriate for inpatient rehabilitation. Will admit to inpatient rehab today.  Preadmission Screen Completed By:  Michel Santee, PT, 03/06/2019 3:16 PM ______________________________________________________________________   Discussed status with Dr. Letta Pate on 03/06/2019 at 3:16 PM  and received approval for admission today.  Admission Coordinator:  Michel Santee, time 3:16 PM Sudie Grumbling 02/25/2019          Cosigned by: Charlett Blake, MD at 03/19/2019 3:54 PM  Revision History

## 2019-03-13 NOTE — Progress Notes (Addendum)
[facility] PHYSICAL MEDICINE & REHABILITATION PROGRESS NOTE   Subjective/Complaints:  Still with foley, no c/o but minimal verbal output , pt is awake and following 1 step commands ~50%  ROS- cannot obtain, cognition and aphasia   Objective:   No results found. Recent Labs    03/11/19 0350 03/13/19 0452  WBC 10.2 8.7  HGB 12.5* 13.0  HCT 39.8  36.3* 42.1  PLT 179 208   Recent Labs    03/11/19 0350 03/13/19 0452  NA 142 145  K 4.3 4.3  CL 109 109  CO2 22 26  GLUCOSE 132* 132*  BUN 39* 33*  CREATININE 1.19 1.04  CALCIUM 8.9 8.9   No intake or output data in the 24 hours ending 03/13/19 0815   Physical Exam: Vital Signs Blood pressure 128/85, pulse 80, temperature 98.3 F (36.8 C), temperature source Oral, resp. rate 16, SpO2 94 %.  General: No acute distress Neck- large trach stoma, no trach tube Mood and affect are appropriate Heart: Regular rate and rhythm no rubs murmurs or extra sounds Lungs: Clear to auscultation, breathing unlabored, no rales or wheezes Abdomen: Positive bowel sounds, soft nontender to palpation, nondistended Extremities: No clubbing, cyanosis, or edema Skin: No evidence of breakdown, no evidence of rash Neurologic: Cranial nerves II through XII intact, motor strength is 4/5 in Left and 3- RIght deltoid, bicep, tricep, grip,2- Right and 4/5 left  hip flexor, knee extensors, ankle dorsiflexor and plantar flexor Sensory exam cannot assess due to cognition/aphasia   Cerebellar exam cannot assess due to cognition as well as strength on Right side Musculoskeletal: . No joint swelling    Assessment/Plan: 1. Functional deficits secondary to Left PCA and PICA infarcts  which require 3+ hours per day of interdisciplinary therapy in a comprehensive inpatient rehab setting.  Physiatrist is providing close team supervision and 24 hour management of active medical problems listed below.  Physiatrist and rehab team continue to assess barriers to  discharge/monitor patient progress toward functional and medical goals  Care Tool:  Bathing              Bathing assist       Upper Body Dressing/Undressing Upper body dressing        Upper body assist      Lower Body Dressing/Undressing Lower body dressing            Lower body assist       Toileting Toileting    Toileting assist Assist for toileting: Maximal Assistance - Patient 25 - 49%     Transfers Chair/bed transfer  Transfers assist     Chair/bed transfer assist level: Maximal Assistance - Patient 25 - 49%     Locomotion Ambulation   Ambulation assist              Walk 10 feet activity   Assist           Walk 50 feet activity   Assist           Walk 150 feet activity   Assist           Walk 10 feet on uneven surface  activity   Assist           Wheelchair     Assist               Wheelchair 50 feet with 2 turns activity    Assist            Wheelchair 150 feet  activity     Assist          Medical Problem List and Plan: 1.Decreased functional mobility, right homonymous hemianopsia as well as cognitive impairmentssecondary to bilateral posterior circulation infarctions as well as history of CVA CIR evals today 2. Antithrombotics: -DVT/anticoagulation:Eliquis- monitor for anemia -antiplatelet therapy: N/A 3. Pain Management:Tylenol as needed 4. Mood:Provide emotional support -antipsychotic agents: Seroquel 25 mg daily at bedtime 5. Neuropsych: This patientis notcapable of making decisions on hisown behalf. 6. Skin/Wound Care:Routine skin checks 7. Fluids/Electrolytes/Nutrition:Routine in and out's with follow-up chemistries, elevated BUN enc fluid 8. History of atrial fibrillation. Cardiac rate controlled. Continue Eliquis 9. Hematuria/UTI. Followed by urology services Dr.Dahlstedt. Continue Flomax 0.4 mg twice daily. Complete  course of Rocephin for UTI prior to voiding trial 10. Hypertension. Lopressor 12.5 mg twice a day. Monitor with increased mobility Vitals:   03/13/19 0446 03/13/19 0709  BP: 128/85   Pulse: 83 80  Resp: 14 16  Temp: 98.3 F (36.8 C)   SpO2: 96% 94%  Controlled 4/17 11. Type 2 diabetes mellitus. Sliding scale insulin. Check blood sugars before meals and at bedtime. Patient on Glucophage 500 mg twice daily prior to admission. Resume as needed CBG (last 3)  Recent Labs    03/23/2019 1634 02/26/2019 2240 03/13/19 0641  GLUCAP 150* 102* 114*   12. Hyperlipidemia. Zocor 13. History of laryngeal cancer. Status post laryngectomy, no trach tube large stoma, no need for continuous pulse ox. Monitor for any increased secretions    LOS: 1 days A FACE TO FACE EVALUATION WAS PERFORMED  Charlett Blake 03/13/2019, 8:15 AM

## 2019-03-13 NOTE — Consult Note (Signed)
[facility] Nurse wound consult note Patient receiving care in [facility] [phone]. Reason for Consult: sacral wound Wound type: DTPI to buttocks/low sacral area Pressure Injury POA: Yes Measurement: 3.4 cm x 5.4 cm Wound [id number] Drainage (amount, consistency, odor) no odor, no drainage, no induration Periwound: healed previous injury with contractures Dressing procedure/placement/frequency: Gerhardt's butt cream; avoid use of sacral foam dressing; turn every 2 hours and avoid the supine position; Use ONLY ONE DermaTherapy pad beneath patient's buttocks. Do NOT use disposable pads; and a low air loss mattress Monitor the wound area(s) for worsening of condition such as: Signs/symptoms of infection,  Increase in size,  Development of or worsening of odor, Development of pain, or increased pain at the affected locations.  Notify the medical team if any of these develop.  Thank you for the consult.  Discussed plan of care with the patient and bedside nurse.  [facility] nurse will not follow at this time.  Please re-consult the [facility] team if needed.  Val Riles, RN, MSN, CWOCN, CNS-BC, pager [phone]

## 2019-03-13 NOTE — Progress Notes (Signed)
[name], MD  Physician  Physical Medicine and Rehabilitation  Consult Note  Signed  Date of Service:  03/09/2019 6:29 AM       Related encounter: ED to Hosp-Admission (Discharged) from 03/05/2019 in [facility]      Signed      Expand All Collapse All    Show:Clear all [x] Manual[x] Template[] Copied  Added by: [x] Angiulli, Lavon Paganini, PA-C[x] [name], MD  [] Hover for details      Physical Medicine and Rehabilitation Consult Reason for Consult: decreased functional mobility Referring Physician: Dr.Xu   HPI: David [name] is a 85 y.o.right handed male with history of diabetes mellitus,laryngeal cancer status post laryngectomy, prior right PCA infarction with right homonymous hemianopsia as well as cognitive impairment, atrial fibrillation not on anticoagulation due to bouts of hematuria. Per chart review patient lives with spouse. Independent with assistive device using a straight point cane. One level home. Presented 03/05/2019 after being found on the floor with right-sided weakness and facial droop. Blood pressure 184/82. CT of the head showed small area of acute infarction left medial cerebellum. CT angiogram of head and neck occlusion of the left posterior cerebral artery at the proximal P2 segment. Left PICA occlusion. Patient did receive TPA. MRI the brain multifocal bilateral posterior circulation acute ischemic infarctions including the left thalamus both medial temporal lobes in the left cerebellum. No hemorrhage or mass effect. Echocardiogram with ejection fraction of 45%. Mild to moderately reduced systolic function.Inferior basal wall hypokinesis. Neurology consulted presently on Eliquis for CVA prophylaxis after being cleared by urology services for noted history of hematuria. Currently maintained on Rocephin for suspected UTI WBC 11-20. Patient remains with chronic tracheostomy tube noted bouts of copious secretions and latest  chest x-ray patchy bibasal or opacities solid base and or pneumonia. Patient is currently on a regular consistency diet. Therapy evaluations completed with recommendations of physical medicine rehabilitation consult.   Review of Systems  Unable to perform ROS: Acuity of condition       Past Medical History:  Diagnosis Date   Cancer of neck ([place])    History of atrial fibrillation    Hyperkalemia    with Ramipril dose at 10 mg, K wnl at 5 mg. 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[place])    Right homonymous hemianopsia    [name] 12/09/2017   Stroke ([place]) 11/2017   peripheral vision loss both eyes/notes 12/09/2017   Type II diabetes mellitus ([place])    Urinary retention    Vertebral fracture, osteoporotic ([facility])         Past Surgical History:  Procedure Laterality Date   AMPUTATION FINGER / THUMB Right    distal amputation x4 fingers (not thumb)    CATARACT EXTRACTION W/ INTRAOCULAR LENS  IMPLANT, BILATERAL Bilateral 2007   DOPPLER ECHOCARDIOGRAPHY     Atrial fib EF 60%--01/18/1994, 12/02/02--atrial fib, EF 50-55   DOPPLER ECHOCARDIOGRAPHY     ra,la,lv,mild dilat.,trace mitral regurg., mild pulm. htn   PILONIDAL CYST EXCISION     RADICAL NECK DISSECTION Left ~ [place]  ~1993   with left radical neck dissection        Family History  Problem Relation Age of Onset   Cirrhosis Father    Colon cancer Neg Hx    Prostate cancer Neg Hx    Social History:  reports that he quit smoking about 27 years ago. He has never  used smokeless tobacco. He reports that he does not drink alcohol or use drugs. Allergies:       Allergies  Allergen Reactions   Actos [Pioglitazone] Other (See Comments)    Hematuria (Blood in the urine) STOPPED BY MD   Eliquis [Apixaban] Other (See Comments)    Hematuria (Blood in the urine) STOPPED BY MD         Medications Prior to Admission    Medication Sig Dispense Refill   acetaminophen (TYLENOL) 500 MG tablet Take 500 mg by mouth every 6 (six) hours as needed for mild pain or headache.      diphenhydrAMINE (BENADRYL) 25 mg capsule Take 25 mg by mouth every 8 (eight) hours as needed for itching or allergies.      diphenhydramine-acetaminophen (TYLENOL PM) 25-500 MG TABS tablet Take 1 tablet by mouth at bedtime as needed (for sleep or pain).     hydrochlorothiazide (HYDRODIURIL) 12.5 MG tablet Take 1 tablet (12.5 mg total) by mouth daily. 90 tablet 3   metFORMIN (GLUCOPHAGE) 500 MG tablet Take 1 tablet (500 mg total) by mouth 2 (two) times daily with a meal. 180 tablet 3   oxyCODONE-acetaminophen (PERCOCET) 5-325 MG tablet Take 1 tablet by mouth daily as needed (for back pain).     ramipril (ALTACE) 5 MG capsule Take 1 capsule (5 mg total) by mouth daily. 90 capsule 3   simvastatin (ZOCOR) 40 MG tablet Take 1 tablet (40 mg total) by mouth at bedtime. 90 tablet 3   tamsulosin (FLOMAX) 0.4 MG CAPS capsule Take 1 capsule (0.4 mg total) by mouth daily. (Patient taking differently: Take 0.4 mg by mouth 2 (two) times daily. ) 90 capsule 3    Home: Home Living Family/patient expects to be discharged to:: Private residence Living Arrangements: Spouse/significant other Available Help at Discharge: Available 24 hours/day Type of Home: House Home Access: Stairs to enter [employer] of Steps: several Entrance Stairs-Rails: Right, Left Home Layout: One level Bathroom Shower/Tub: Multimedia programmer: Standard  Lives With: Spouse  Functional History: Prior Function Level of Independence: Independent, Independent with assistive device(s) Comments: reports intermittent use of SPC Functional Status:  Mobility: Bed Mobility Overal bed mobility: Needs Assistance Bed Mobility: Sit to Supine Supine to sit: Mod assist Sit to supine: Max assist, +2 for physical assistance General bed mobility  comments: pt didn't follow to directional cues as well today.  need more assist in general and more 2nd person assist Transfers Overall transfer level: Needs assistance Equipment used: Rolling walker (2 wheeled), 2 person hand held assist Transfers: Sit to/from Stand Sit to Stand: Max assist, +2 physical assistance Stand pivot transfers: Mod assist, +2 physical assistance, +2 safety/equipment General transfer comment: pt started pushing from left to right within session, increasing assistance needs Ambulation/Gait Ambulation/Gait assistance: Mod assist Gait Distance (Feet): 15 Feet(to toilet then additional 40 feet) Assistive device: IV Pole, 1 person hand held assist Gait Pattern/deviations: Step-through pattern General Gait Details: unable to get pt upright enough to safely ambulate Gait velocity interpretation: <1.31 ft/sec, indicative of household ambulator  ADL: ADL Overall ADL's : Needs assistance/impaired Eating/Feeding: Minimal assistance, Sitting Grooming: Minimal assistance, Sitting Upper Body Bathing: Minimal assistance, Sitting Lower Body Bathing: Moderate assistance, +2 for physical assistance, +2 for safety/equipment, Sit to/from stand Upper Body Dressing : Sitting, Minimal assistance Lower Body Dressing: +2 for physical assistance, Maximal assistance, +2 for safety/equipment, Sit to/from stand Lower Body Dressing Details (indicate cue type and reason): modA +2 for standing balance Toilet  Transfer: Moderate assistance, +2 for physical assistance, +2 for safety/equipment, Stand-pivot Toilet Transfer Details (indicate cue type and reason): simulated via transfer to EOB from recliner Toileting- Clothing Manipulation and Hygiene: Maximal assistance, +2 for physical assistance, +2 for safety/equipment, Sit to/from stand Functional mobility during ADLs: Moderate assistance, +2 for physical assistance, +2 for safety/equipment(stand pivot transfers) General ADL Comments: pt with  impaired cognition, decreased coordination, impaired standing balance and decreased activity tolerance  Cognition: Cognition Overall Cognitive Status: (pt presented confused and a little hallucinatory.) Arousal/Alertness: Awake/alert Orientation Level: Disoriented X4 Attention: Focused, Sustained Focused Attention: Appears intact Sustained Attention: Appears intact Awareness: Impaired Awareness Impairment: Intellectual impairment Problem Solving: Impaired Problem Solving Impairment: Verbal complex(1/3) Cognition Arousal/Alertness: Awake/alert Behavior During Therapy: WFL for tasks assessed/performed, Impulsive Overall Cognitive Status: (pt presented confused and a little hallucinatory.) Area of Impairment: Attention, Memory, Following commands, Safety/judgement, Awareness Current Attention Level: Sustained Memory: Decreased short-term memory Following Commands: Follows one step commands inconsistently, Follows one step commands with increased time Safety/Judgement: Decreased awareness of safety, Decreased awareness of deficits Awareness: Intellectual General Comments: pt somewhat impulsive and requires cues for safety, requiring cues/assist from nursing staff prior to start of session to remain seated in recliner as pt attempting to get up on his own; pt with decreased insight into current deficits as he attempts to get up unassisted but needed external assist for safe mobility completion; pt also with difficulty following commands without additional cues  Blood pressure 101/86, pulse (!) 128, temperature 98.4 F (36.9 C), temperature source Oral, resp. rate 20, height 6\' 3"  (1.905 m), weight 104 kg, SpO2 98 %. Physical Exam  Constitutional:  lethargic  HENT:  Head: Normocephalic.  Eyes: Right eye exhibits no discharge. Left eye exhibits no discharge.  Neck: Normal range of motion.  Cardiovascular: Normal rate.  Respiratory: Effort normal.  GI: Soft.  Neurological:  Patient  is lethargic. He does follow some demonstrated commands but inconsistently. Hypophonia due to laryngectomy. He only uses some simple words. Poor sitting balance, leaning to right. Able to help correct posture somewhat. Moves left more spontaneously than right.   Psychiatric:  Flat, lethargic    LabResultsLast24Hours       Results for orders placed or performed during the hospital encounter of 03/05/19 (from the past 24 hour(s))  Urinalysis, Routine w reflex microscopic     Status: Abnormal   Collection Time: 03/08/19 11:58 AM  Result Value Ref Range   Color, Urine AMBER (A) YELLOW   APPearance CLEAR CLEAR   Specific Gravity, Urine 1.025 1.005 - 1.030   pH 5.0 5.0 - 8.0   Glucose, UA NEGATIVE NEGATIVE mg/dL   Hgb urine dipstick LARGE (A) NEGATIVE   Bilirubin Urine SMALL (A) NEGATIVE   Ketones, ur 15 (A) NEGATIVE mg/dL   Protein, ur NEGATIVE NEGATIVE mg/dL   Nitrite NEGATIVE NEGATIVE   Leukocytes,Ua SMALL (A) NEGATIVE  Urinalysis, Microscopic (reflex)     Status: Abnormal   Collection Time: 03/08/19 11:58 AM  Result Value Ref Range   RBC / HPF 21-50 0 - 5 RBC/hpf   WBC, UA 11-20 0 - 5 WBC/hpf   Bacteria, UA MANY (A) NONE SEEN   Squamous Epithelial / LPF 0-5 0 - 5  Glucose, capillary     Status: Abnormal   Collection Time: 03/08/19  2:35 PM  Result Value Ref Range   Glucose-Capillary 128 (H) 70 - 99 mg/dL   Comment 1 Notify RN    Comment 2 Document in Chart   Glucose, capillary  Status: Abnormal   Collection Time: 03/08/19  5:01 PM  Result Value Ref Range   Glucose-Capillary 128 (H) 70 - 99 mg/dL   Comment 1 Notify RN    Comment 2 Document in Chart   Glucose, capillary     Status: Abnormal   Collection Time: 03/08/19  9:33 PM  Result Value Ref Range   Glucose-Capillary 128 (H) 70 - 99 mg/dL      ImagingResults(Last48hours)  Dg Chest Port 1 View  Result Date: 03/08/2019 CLINICAL DATA:  Shortness of breath. History of stroke,  diabetes and laryngeal cancer. EXAM: PORTABLE CHEST 1 VIEW COMPARISON:  Radiographs 03/06/2019. FINDINGS: 1155 hours. Stable cardiomegaly and aortic atherosclerosis. Patchy left-greater-than-right basilar opacities are slightly increased over the last few days. There is no confluent airspace opacity, edema or large pleural effusion. There is no pneumothorax or acute osseous abnormality. Telemetry leads overlie the chest. IMPRESSION: Mildly increased patchy bibasilar opacities, likely atelectasis. No consolidation or edema. Electronically Signed   By: Richardean Sale M.D.   On: 03/08/2019 12:49      Assessment/Plan: Diagnosis:  Bilateral posterior circulation infarcts with significant mobility and cognitive deficits 1. Does the need for close, 24 hr/day medical supervision in concert with the patient's rehab needs make it unreasonable for this patient to be served in a less intensive setting? Yes and Potentially 2. Co-Morbidities requiring supervision/potential complications: DM, afib, hyperkalemia 3. Due to bladder management, bowel management, safety, skin/wound care, disease management, medication administration and patient education, does the patient require 24 hr/day rehab nursing? Yes 4. Does the patient require coordinated care of a physician, rehab nurse, PT (1-2 hrs/day, 5 days/week), OT (1-2 hrs/day, 5 days/week) and SLP (1-2 hrs/day, 5 days/week) to address physical and functional deficits in the context of the above medical diagnosis(es)? Yes Addressing deficits in the following areas: balance, endurance, locomotion, strength, transferring, bowel/bladder control, bathing, dressing, feeding, grooming, toileting, cognition, speech, language, swallowing and psychosocial support 5. Can the patient actively participate in an intensive therapy program of at least 3 hrs of therapy per day at least 5 days per week? Yes and Potentially 6. The potential for patient to make measurable gains while on  inpatient rehab is good 7. Anticipated functional outcomes upon discharge from inpatient rehab are min assist and mod assist  with PT, min assist and mod assist with OT, supervision and min assist with SLP. 8. Estimated rehab length of stay to reach the above functional goals is: 20-27 days 9. Anticipated D/C setting: Home 10. Anticipated post D/C treatments: [place] therapy 11. Overall Rehab/Functional Prognosis: excellent  RECOMMENDATIONS: This patient's condition is appropriate for continued rehabilitative care in the following setting: CIR Patient has agreed to participate in recommended program. N/A Note that insurance prior authorization may be required for reimbursement for recommended care.  Comment: Pt with very poor activity tolerance at present. Rehab Admissions Coordinator to follow up.  Thanks,  [name], MD, Mellody Drown  I have personally performed a face to face diagnostic evaluation of this patient. Additionally, I have examined pertinent labs and radiographic images. I have reviewed and concur with the physician assistant's documentation above.    Lavon Paganini Angiulli, PA-C 03/09/2019        Revision History                        Routing History

## 2019-03-14 ENCOUNTER — Inpatient Hospital Stay (HOSPITAL_COMMUNITY): Payer: Medicare Other | Admitting: Occupational Therapy

## 2019-03-14 ENCOUNTER — Inpatient Hospital Stay (HOSPITAL_COMMUNITY): Payer: Medicare Other | Admitting: Speech Pathology

## 2019-03-14 ENCOUNTER — Inpatient Hospital Stay (HOSPITAL_COMMUNITY): Payer: Medicare Other | Admitting: Physical Therapy

## 2019-03-14 LAB — GLUCOSE, CAPILLARY
Glucose-Capillary: 113 mg/dL — ABNORMAL HIGH (ref 70–99)
Glucose-Capillary: 128 mg/dL — ABNORMAL HIGH (ref 70–99)
Glucose-Capillary: 119 mg/dL — ABNORMAL HIGH (ref 70–99)
Glucose-Capillary: 142 mg/dL — ABNORMAL HIGH (ref 70–99)

## 2019-03-14 NOTE — Progress Notes (Signed)
Physical Therapy Session Note  Patient Details  Name: David [name] MRN: [id number] Date of Birth: 1932/04/07  Today's Date: 03/14/2019 PT Individual Time: 1415-1445 PT Individual Time Calculation (min): 30 min   Short Term Goals: Week 1:  PT Short Term Goal 1 (Week 1): Pt will be able to tolerate OOB x 2 hours for increased overall activity tolerance PT Short Term Goal 2 (Week 1): Pt will be able to perform functional transfer with max assist +2 PT Short Term Goal 3 (Week 1): Pt will be able to demonstrate sitting balance during functional task with mod assist  Skilled Therapeutic Interventions/Progress Updates:  Pt was seen bedside in the pm. Pt transferred supine to edge of bed, edge of bed to supine with max to total A and verbal cues. Pt tolerated edge of bed with c/g to min A and verbal cues. Pt fatigued throughout treatment. Pt unwilling to utilize voice stimulator during treatment. Pt performed LE exercises, hip flex, hip abd/add, and SAQs, 2 sets x 10 reps each. Pt fatigued and unable to participate with further therapy. Treatment ended.    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); sacral wound Restrictions Weight Bearing Restrictions: No General: PT Amount of Missed Time (min): 45 Minutes PT Missed Treatment Reason: Patient fatigue Pain: No c/o pain.    Therapy/Group: Individual Therapy  Dub Amis 03/14/2019, 3:29 PM

## 2019-03-14 NOTE — Progress Notes (Signed)
[place] PHYSICAL MEDICINE & REHABILITATION PROGRESS NOTE   Subjective/Complaints:  Asleep in bed. Awakens fairly easily  ROS: Limited due to cognitive/behavioral   Objective:   No results found. Recent Labs    03/13/19 0452  WBC 8.7  HGB 13.0  HCT 42.1  PLT 208   Recent Labs    03/13/19 0452  NA 145  K 4.3  CL 109  CO2 26  GLUCOSE 132*  BUN 33*  CREATININE 1.04  CALCIUM 8.9    Intake/Output Summary (Last 24 hours) at 03/14/2019 0951 Last data filed at 03/14/2019 0401 Gross per 24 hour  Intake 180 ml  Output 1951 ml  Net -1771 ml     Physical Exam: Vital Signs Blood pressure 104/79, pulse 90, temperature 98.4 F (36.9 C), temperature source Axillary, resp. rate 18, SpO2 96 %.  Constitutional: No distress . Vital signs reviewed. HEENT: EOMI, oral membranes moist Neck: supple Cardiovascular: RRR without murmur. No JVD    Respiratory: CTA Bilaterally without wheezes or rales. Normal effort    GI: BS +, non-tender, non-distended  Extremities: No clubbing, cyanosis, or edema Skin: No evidence of breakdown, no evidence of rash Neurologic: Cranial nerves II through XII intact, motor strength is 4/5 in Left and 3- RIght deltoid, bicep, tricep, grip,2- Right and 4/5 left  hip flexor, knee extensors, ankle dorsiflexor and plantar flexor Sensory exam cannot assess due to cognition/aphasia   Cerebellar exam cannot assess due to cognition as well as strength on Right side Musculoskeletal: . No joint swelling    Assessment/Plan: 1. Functional deficits secondary to Left PCA and PICA infarcts  which require 3+ hours per day of interdisciplinary therapy in a comprehensive inpatient rehab setting.  Physiatrist is providing close team supervision and 24 hour management of active medical problems listed below.  Physiatrist and rehab team continue to assess barriers to discharge/monitor patient progress toward functional and medical goals  Care Tool:  Bathing         Body parts bathed by helper: Right arm, Left upper leg, Left arm, Right lower leg, Chest, Abdomen, Left lower leg, Face, Front perineal area, Buttocks, Right upper leg     Bathing assist Assist Level: 2 Helpers     Upper Body Dressing/Undressing Upper body dressing   What is the patient wearing?: Hospital gown only    Upper body assist Assist Level: 2 Helpers    Lower Body Dressing/Undressing Lower body dressing      What is the patient wearing?: Incontinence brief     Lower body assist Assist for lower body dressing: 2 Helpers     Toileting Toileting    Toileting assist Assist for toileting: 2 Helpers(+2 from bed level)     Transfers Chair/bed transfer  Transfers assist     Chair/bed transfer assist level: Dependent - mechanical lift     Locomotion Ambulation   Ambulation assist   Ambulation activity did not occur: Safety/medical concerns          Walk 10 feet activity   Assist  Walk 10 feet activity did not occur: Safety/medical concerns        Walk 50 feet activity   Assist Walk 50 feet with 2 turns activity did not occur: Safety/medical concerns         Walk 150 feet activity   Assist Walk 150 feet activity did not occur: Safety/medical concerns         Walk 10 feet on uneven surface  activity   Assist Walk  10 feet on uneven surfaces activity did not occur: Safety/medical concerns         Wheelchair     Assist Will patient use wheelchair at discharge?: Yes Type of Wheelchair: Manual Wheelchair activity did not occur: Safety/medical concerns(use of TIS due to positioning)  Wheelchair assist level: Dependent - Patient 0%(TIS)      Wheelchair 50 feet with 2 turns activity    Assist        Assist Level: Dependent - Patient 0%   Wheelchair 150 feet activity     Assist     Assist Level: Dependent - Patient 0%    Medical Problem List and Plan: 1.Decreased functional mobility, right homonymous  hemianopsia as well as cognitive impairmentssecondary to bilateral posterior circulation infarctions as well as history of CVA   --Continue CIR therapies including PT, OT, and SLP  2. Antithrombotics: -DVT/anticoagulation:Eliquis- monitor for anemia -antiplatelet therapy: N/A 3. Pain Management:Tylenol as needed 4. Mood:Provide emotional support -antipsychotic agents: Seroquel 25 mg daily at bedtime 5. Neuropsych: This patientis notcapable of making decisions on hisown behalf. 6. Skin/Wound Care:Routine skin checks 7. Fluids/Electrolytes/Nutrition:Routine in and out's with follow-up chemistries, elevated BUN enc fluid 8. History of atrial fibrillation. Cardiac rate controlled. Continue Eliquis 9. Hematuria/UTI. Followed by urology services Dr.Dahlstedt. Continue Flomax 0.4 mg twice daily. Complete course of Rocephin for UTI prior to voiding trial 10. Hypertension. Lopressor 12.5 mg twice a day. Monitor with increased mobility Vitals:   03/14/19 0354 03/14/19 0805  BP: 104/79   Pulse: 93 90  Resp: 18 18  Temp: 98.4 F (36.9 C)   SpO2: 95% 96%  Controlled 4/18 11. Type 2 diabetes mellitus. Sliding scale insulin. Check blood sugars before meals and at bedtime. Patient on Glucophage 500 mg twice daily prior to admission. Resume as needed CBG (last 3)  Recent Labs    03/13/19 1643 03/13/19 2129 03/14/19 0629  GLUCAP 138* 137* 113*   -fair control 12. Hyperlipidemia. Zocor 13. History of laryngeal cancer. Status post laryngectomy, no trach tube large stoma, no need for continuous pulse ox. Monitor for any increased secretions    LOS: 2 days A FACE TO FACE EVALUATION WAS PERFORMED  [name] 03/14/2019, 9:51 AM

## 2019-03-14 NOTE — Progress Notes (Signed)
Speech Language Pathology Daily Session Note  Patient Details  Name: David [name] MRN: [id number] Date of Birth: 1932-11-14  Today's Date: 03/14/2019   Skilled treatment session #1 SLP Individual Time: 4462-8638 SLP Individual Time Calculation (min): 20 min   Skilled treatment session #2 SLP Individual Time: 1771-1657 SLP Individual Time Calculation (min): 25 min  Short Term Goals: Week 1: SLP Short Term Goal 1 (Week 1): Patient will maximize speech intelligibility to 50% at the phrase level with Mod A multimodal cues for use of strategies and placement of electrolarynx SLP Short Term Goal 2 (Week 1): Patient will verbalize wants/needs at the phrase level with Mod A verbal cues for word-finding.  SLP Short Term Goal 3 (Week 1): Patient will demonstrate sustained attention to functional tasks for 5 minutes with Mod A verbal cues.  SLP Short Term Goal 4 (Week 1): Patient will demonstrate orientation to place, time, and situation with Mod A multimodal cues.  SLP Short Term Goal 5 (Week 1): Patient will attend to right field of enviornment during functional tasks with Mod A multimodal cues.   Skilled Therapeutic Interventions:  Skilled treatment session #1 focused on cognition goals. SLP received pt in bed asleep. Pt didn't have trach collar in place and had O2 sat of low 90's. Despite Total A pt was not able to arouse or open eyes. Verbal, tactile and physical stimulation provided such as cold washcloth to face. Pt with no change in arousal. Pt was inappropriate for consuming PO intake and nursing aware. Will attempt therapy again.   Skilled treatment session #2 focused on cognition goals. SLP received pt asleep in bed. Continued attempts made by this writer to promote arousal but to no avail. Pt with change in arousal. Nursing aware in case that arousal may be related to medication. ST services will continue to follow.      Pain Pain Assessment Pain Scale: Faces Faces Pain Scale: No  hurt  Therapy/Group: Individual Therapy  Maela Takeda 03/14/2019, 10:51 AM

## 2019-03-14 NOTE — Progress Notes (Signed)
Occupational Therapy Session Note  Patient Details  Name: David [name] MRN: [id number] Date of Birth: 05/01/1932  Today's Date: 03/14/2019 OT Individual Time: 1045-1150 OT Individual Time Calculation (min): 65 min    Skilled Therapeutic Interventions/Progress Updates:    1:1 Pt easily aroused this am however declining attempts to use voice stimulator today despite attempts to help. Pt did report his desire to go home. Pt agreeable to getting into the w/c. Paper pants donned on in bed. Pt assisted with coming to the EOB off on the left side of the bed; with max A. Pt able to sit EOB with mod A today with increased alertness but decr stama to remain here for participation in an activity. Attempted to A pt into standing with +2 however pt didn't initiate full standing to pull up paper pants; however pt went ahead and tried to initiate performing a scoot transfer bed to w/c (on his right) with max A+2 for safety. Pt unable to initiate a stand from the w/c.  Pt with elevated HR to 120s throughout session even with rest.  Pt with initiation of right LE!!!!!!!! Able to wiggle his toes, ankle and kick out his Leg! Pt reports having sensation in his LE. Participated in grooming at the sink (shaving- applying shaving cream with max A due to apraxia- pt trying to shave with nothing in his hand)- then feel asleep  During therapist shaving pt. Pt impulsive with drinking water requiring A to control cup and oral spillage. Pt left up at the RN station for safety with continuous pulse ox.   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); sacral wound Restrictions Weight Bearing Restrictions: No General: General OT Amount of Missed Time: 10 Minutes due to fatigue.     Pain:  no reports of  pain   Therapy/Group: Individual Therapy  Willeen Cass [facility] 03/14/2019, 12:49 PM

## 2019-03-14 NOTE — IPOC Note (Signed)
Overall Plan of Care [facility]) Patient Details Name: David [name] MRN: [id number] DOB: 1932-03-04  Admitting Diagnosis: Hemiparesis affecting right side as late effect of stroke [facility])  Hospital Problems: Principal Problem:   Hemiparesis affecting right side as late effect of stroke ([place]) Active Problems:   Essential hypertension   Atrial fibrillation with RVR ([place])   Acute embolic stroke ([place]) multifocal d/t AF s/p tPA.   Urinary retention   Hemianopia, homonymous, right   Cerebrovascular accident (CVA) of left thalamus ([place])     Functional Problem List: Nursing Bladder, Bowel, Safety, Skin Integrity  PT Balance, Edema, Endurance, Motor, Pain, Perception, Safety, Sensory, Skin Integrity  OT Balance, Perception, Behavior, Safety, Cognition, Edema, Skin Integrity, Sensory, Endurance, Vision, Motor, Nutrition, Pain  SLP Cognition, Linguistic  TR         Basic ADL's: OT Grooming, Eating, Bathing, Dressing, Toileting     Advanced  ADL's: OT       Transfers: PT Bed Mobility, Bed to Chair, Car, Manufacturing systems engineer, Metallurgist: PT Ambulation, Emergency planning/management officer, Stairs     Additional Impairments: OT Fuctional Use of Upper Extremity  SLP Communication, Social Cognition expression Problem Solving, Attention, Awareness, Memory  TR      Anticipated Outcomes Item Anticipated Outcome  Self Feeding supervision  Swallowing      Basic self-care  mod A   Toileting  Mod A    Bathroom Transfers mod A  Bowel/Bladder  Max Assist  Transfers  mod assist basic transfers w/c level  Locomotion  min assist w/c mobility  Communication  Supervision   Cognition  Min A  Pain  Min Assist  Safety/Judgment  Max Assist   Therapy Plan: PT Intensity: Minimum of 1-2 x/day ,45 to 90 minutes PT Frequency: 5 out of 7 days(likely may need to decrease to 15/7 next week) PT Duration Estimated Length of Stay: 21-28 days OT Intensity: Minimum of 1-2 x/day, 45 to 90  minutes OT Frequency: 5 out of 7 days OT Duration/Estimated Length of Stay: ~4 weeks (28 days) SLP Intensity: Minumum of 1-2 x/day, 30 to 90 minutes SLP Frequency: 3 to 5 out of 7 days SLP Duration/Estimated Length of Stay: 21-28 days   Due to the current state of emergency, patients may not be receiving their 3-hours of Medicare-mandated therapy.   Team Interventions: Nursing Interventions Bladder Management, Bowel Management, Skin Care/Wound Management  PT interventions Ambulation/gait training, Balance/vestibular training, Cognitive remediation/compensation, Discharge planning, Disease management/prevention, DME/adaptive equipment instruction, Functional mobility training, Neuromuscular re-education, Pain management, Patient/family education, Psychosocial support, Skin care/wound management, Splinting/orthotics, Therapeutic Activities, Therapeutic Exercise, UE/LE Strength taining/ROM, UE/LE Coordination activities, Visual/perceptual remediation/compensation, Wheelchair propulsion/positioning  OT Interventions Balance/vestibular training, Discharge planning, Functional electrical stimulation, Pain management, Self Care/advanced ADL retraining, Therapeutic Activities, UE/LE Coordination activities, Visual/perceptual remediation/compensation, Therapeutic Exercise, Skin care/wound managment, Patient/family education, Functional mobility training, Disease mangement/prevention, Cognitive remediation/compensation, Community reintegration, Engineer, drilling, Neuromuscular re-education, Psychosocial support, Splinting/orthotics, UE/LE Strength taining/ROM, Wheelchair propulsion/positioning  SLP Interventions Cognitive remediation/compensation, Environmental controls, Internal/external aids, Speech/Language facilitation, Therapeutic Activities, Patient/family education, Functional tasks, Cueing hierarchy  TR Interventions    SW/CM Interventions Discharge Planning, Psychosocial Support,  Patient/Family Education   Barriers to Discharge MD  Medical stability, Trach, IV antibiotics and Incontinence  Nursing Trach, Incontinence, Wound Care    PT Inaccessible home environment, Decreased caregiver support, Incontinence, Medical stability recommend ramp for d/c; will need hired cargivers. per chart, wife only able to provide supervision level assist  OT  SLP      SW Decreased caregiver support, Insurance for SNF coverage Wife can not provide physical care due to her back issues. Pt has Blue Medicare and they have declined coverage of NH once pt has been to [facility]   Team Discharge Planning: Destination: PT-Home ,OT- Home , SLP-Home Projected Follow-up: PT-Home health PT, 24 hour supervision/assistance, OT-  Home health OT, SLP-24 hour supervision/assistance, Home Health SLP Projected Equipment Needs: PT-Wheelchair (measurements), Wheelchair cushion (measurements), To be determined, OT- To be determined, SLP-None recommended by SLP Equipment Details: PT- , OT-  Patient/family involved in discharge planning: PT- Patient,  OT-Patient, SLP-Patient, Family member/caregiver  MD ELOS: 22-27d Medical Rehab Prognosis:  Fair Assessment:  85 year old right-handed male history of diabetes mellitus, laryngeal cancer status post laryngectomy, prior right PCA infarction with right homonymous hemianopsia as well as cognitive impairment, atrial fibrillation not on anticoagulation due to bouts of hematuria. Per chart review patient lives with spouse. Independent with assistive device using a straight cane. One level home. Presented 03/05/2019 after being found down on the floor with right-sided weakness and facial droop. Blood pressure 184/182. CT of the head showed small area of acute infarction left medial cerebellum. CT angiogram of head and neck occlusion of the left posterior cerebral artery at the proximal P2 segment area left PICA occlusion. Patient did not receive TPA. MRI the brain showed  multifocal bilateral posterior circulation acute ischemic infarctions involving the left thalamus both medial temporal lobes in the left cerebellum. No hemorrhage or mass effect. Echocardiogram with ejection fraction of 45% mild to moderate reduced systolic function. Inferior basal wall hypokinesis. Neurology service is consulted maintained on Eliquis after being cleared by urology services for noted history of hematuria. Urine culture greater than 100,000 Klebsiella oxytoca and completing a course of Rocephin.patient noted history of laryngeal cancer with laryngectomy noted fair amount of copious secretions latest chest x-ray showed patchy bibasilar opacities, no consolidation or pneumonia   Now requiring 24/7 Rehab RN,MD, as well as CIR level PT, OT and SLP.  Treatment team will focus on ADLs and mobility with goals set at Mod A See Team Conference Notes for weekly updates to the plan of care

## 2019-03-15 ENCOUNTER — Inpatient Hospital Stay (HOSPITAL_COMMUNITY): Payer: Medicare Other

## 2019-03-15 ENCOUNTER — Inpatient Hospital Stay (HOSPITAL_COMMUNITY): Payer: Medicare Other | Admitting: Occupational Therapy

## 2019-03-15 ENCOUNTER — Inpatient Hospital Stay (HOSPITAL_COMMUNITY): Payer: Medicare Other | Admitting: Physical Therapy

## 2019-03-15 LAB — BASIC METABOLIC PANEL
Anion gap: 10 (ref 5–15)
BUN: 28 mg/dL — ABNORMAL HIGH (ref 8–23)
CO2: 26 mmol/L (ref 22–32)
Calcium: 9.2 mg/dL (ref 8.9–10.3)
Chloride: 109 mmol/L (ref 98–111)
Creatinine, Ser: 1 mg/dL (ref 0.61–1.24)
GFR calc Af Amer: >60 mL/min (ref >60)
GFR calc non Af Amer: >60 mL/min (ref >60)
Glucose, Bld: 151 mg/dL — ABNORMAL HIGH (ref 70–99)
Potassium: 4 mmol/L (ref 3.5–5.1)
Sodium: 145 mmol/L (ref 135–145)

## 2019-03-15 LAB — URINALYSIS, COMPLETE (UACMP) WITH MICROSCOPIC
Bilirubin Urine: NEGATIVE
Glucose, UA: NEGATIVE mg/dL
Ketones, ur: NEGATIVE mg/dL
Leukocytes,Ua: NEGATIVE
Nitrite: NEGATIVE
Protein, ur: NEGATIVE mg/dL
RBC / HPF: >50 RBC/hpf — ABNORMAL HIGH (ref 0–5)
Specific Gravity, Urine: 1.008 (ref 1.005–1.030)
pH: 6 (ref 5.0–8.0)

## 2019-03-15 LAB — GLUCOSE, CAPILLARY
Glucose-Capillary: 104 mg/dL — ABNORMAL HIGH (ref 70–99)
Glucose-Capillary: 149 mg/dL — ABNORMAL HIGH (ref 70–99)
Glucose-Capillary: 162 mg/dL — ABNORMAL HIGH (ref 70–99)
Glucose-Capillary: 102 mg/dL — ABNORMAL HIGH (ref 70–99)

## 2019-03-15 LAB — CBC
HCT: 42.4 % (ref 39.0–52.0)
Hemoglobin: 13.3 g/dL (ref 13.0–17.0)
MCH: 32.1 pg (ref 26.0–34.0)
MCHC: 31.4 g/dL (ref 30.0–36.0)
MCV: 102.4 fL — ABNORMAL HIGH (ref 80.0–100.0)
Platelets: 209 10*3/uL (ref 150–400)
RBC: 4.14 MIL/uL — ABNORMAL LOW (ref 4.22–5.81)
RDW: 14.9 % (ref 11.5–15.5)
WBC: 8.9 10*3/uL (ref 4.0–10.5)
nRBC: 0 % (ref 0.0–0.2)

## 2019-03-15 LAB — PREALBUMIN: Prealbumin: 7.1 mg/dL — ABNORMAL LOW (ref 18–38)

## 2019-03-15 LAB — LACTIC ACID, PLASMA: Lactic Acid, Venous: 1.7 mmol/L (ref 0.5–1.9)

## 2019-03-15 MED ORDER — FUROSEMIDE 10 MG/ML IJ SOLN
40.0000 mg | Freq: Once | INTRAMUSCULAR | Status: AC
  Administered 2019-03-15: 19:00:00 40 mg via INTRAVENOUS
  Filled 2019-03-15: qty 4

## 2019-03-15 MED ORDER — GUAIFENESIN-DM 100-10 MG/5ML PO SYRP
10.0000 mL | ORAL_SOLUTION | Freq: Four times a day (QID) | ORAL | Status: DC
  Administered 2019-03-15 – 2019-03-24 (×35): 10 mL via ORAL
  Filled 2019-03-15 (×35): qty 10

## 2019-03-15 NOTE — Progress Notes (Signed)
Pt returned to room via bed. telesitter in place. Side rails up x 4. Call bell placed in reach. Will cont to monitor.  Erie Noe, LPN

## 2019-03-15 NOTE — Progress Notes (Signed)
[place] PHYSICAL MEDICINE & REHABILITATION PROGRESS NOTE   Subjective/Complaints:  No new issues. Uneventful night per RN  ROS: Limited due to cognitive/behavioral    Objective:   No results found. Recent Labs    03/13/19 0452  WBC 8.7  HGB 13.0  HCT 42.1  PLT 208   Recent Labs    03/13/19 0452  NA 145  K 4.3  CL 109  CO2 26  GLUCOSE 132*  BUN 33*  CREATININE 1.04  CALCIUM 8.9    Intake/Output Summary (Last 24 hours) at 03/15/2019 0958 Last data filed at 03/15/2019 1497 Gross per 24 hour  Intake 791.33 ml  Output 1200 ml  Net -408.67 ml     Physical Exam: Vital Signs Blood pressure 130/90, pulse 75, temperature 98.5 F (36.9 C), temperature source Oral, resp. rate 16, SpO2 (!) 85 %.  Constitutional: No distress . Vital signs reviewed. HEENT: EOMI, oral membranes moist Neck: supple Cardiovascular: RRR without murmur. No JVD    Respiratory: CTA Bilaterally without wheezes or rales. Normal effort    GI: BS +, non-tender, non-distended  Extremities: No clubbing, cyanosis, or edema Skin: No evidence of breakdown, no evidence of rash Neurologic: Cranial nerves II through XII intact, motor strength is 4/5 in Left and 3- RIght deltoid, bicep, tricep, grip,2- Right and 4/5 left  hip flexor, knee extensors, ankle dorsiflexor and plantar flexor Sensory exam cannot assess due to cognition/aphasia   Cerebellar exam cannot assess due to cognition as well as strength on Right side Musculoskeletal: . No joint swelling    Assessment/Plan: 1. Functional deficits secondary to Left PCA and PICA infarcts  which require 3+ hours per day of interdisciplinary therapy in a comprehensive inpatient rehab setting.  Physiatrist is providing close team supervision and 24 hour management of active medical problems listed below.  Physiatrist and rehab team continue to assess barriers to discharge/monitor patient progress toward functional and medical goals  Care  Tool:  Bathing        Body parts bathed by helper: Right arm, Left upper leg, Left arm, Right lower leg, Chest, Abdomen, Left lower leg, Face, Front perineal area, Buttocks, Right upper leg     Bathing assist Assist Level: 2 Helpers     Upper Body Dressing/Undressing Upper body dressing   What is the patient wearing?: Hospital gown only    Upper body assist Assist Level: Dependent - Patient 0%    Lower Body Dressing/Undressing Lower body dressing      What is the patient wearing?: Incontinence brief     Lower body assist Assist for lower body dressing: 2 Helpers     Toileting Toileting    Toileting assist Assist for toileting: 2 Helpers     Transfers Chair/bed transfer  Transfers assist     Chair/bed transfer assist level: Dependent - mechanical lift     Locomotion Ambulation   Ambulation assist   Ambulation activity did not occur: Safety/medical concerns          Walk 10 feet activity   Assist  Walk 10 feet activity did not occur: Safety/medical concerns        Walk 50 feet activity   Assist Walk 50 feet with 2 turns activity did not occur: Safety/medical concerns         Walk 150 feet activity   Assist Walk 150 feet activity did not occur: Safety/medical concerns         Walk 10 feet on uneven surface  activity  Assist Walk 10 feet on uneven surfaces activity did not occur: Safety/medical concerns         Wheelchair     Assist Will patient use wheelchair at discharge?: Yes Type of Wheelchair: Manual Wheelchair activity did not occur: Safety/medical concerns(use of TIS due to positioning)  Wheelchair assist level: Dependent - Patient 0%(TIS)      Wheelchair 50 feet with 2 turns activity    Assist        Assist Level: Dependent - Patient 0%   Wheelchair 150 feet activity     Assist     Assist Level: Dependent - Patient 0%    Medical Problem List and Plan: 1.Decreased functional mobility,  right homonymous hemianopsia as well as cognitive impairmentssecondary to bilateral posterior circulation infarctions as well as history of CVA   --Continue CIR therapies including PT, OT, and SLP  2. Antithrombotics: -DVT/anticoagulation:Eliquis- monitor for anemia -antiplatelet therapy: N/A 3. Pain Management:Tylenol as needed 4. Mood:Provide emotional support -antipsychotic agents: Seroquel 25 mg daily at bedtime 5. Neuropsych: This patientis notcapable of making decisions on hisown behalf. 6. Skin/Wound Care:Routine skin checks 7. Fluids/Electrolytes/Nutrition:Routine in and out's with follow-up chemistries, elevated BUN enc fluid  -intake inconsistent although ate 100% breakfast  -recheck BMET and prealbumin Monday 8. History of atrial fibrillation. Cardiac rate controlled. Continue Eliquis 9. Hematuria/UTI. Followed by urology services Dr.Dahlstedt. Continue Flomax 0.4 mg twice daily. Complete course of Rocephin for UTI prior to voiding trial 10. Hypertension. Lopressor 12.5 mg twice a day. Monitor with increased mobility Vitals:   03/14/19 2016 03/15/19 0440  BP: (!) 134/98 130/90  Pulse: 73 75  Resp: 18 16  Temp: 97.6 F (36.4 C) 98.5 F (36.9 C)  SpO2: 94% (!) 85%  borderline 4/19---observe 11. Type 2 diabetes mellitus. Sliding scale insulin. Check blood sugars before meals and at bedtime. Patient on Glucophage 500 mg twice daily prior to admission. Resume as needed CBG (last 3)  Recent Labs    03/14/19 1631 03/14/19 2144 03/15/19 0637  GLUCAP 119* 142* 104*   -fair control at present 12. Hyperlipidemia. Zocor 13. History of laryngeal cancer. Status post laryngectomy, no trach tube large stoma, no need for continuous pulse ox. Monitor for any increased secretions    LOS: 3 days A FACE TO FACE EVALUATION WAS PERFORMED  [name] 03/15/2019, 9:58 AM

## 2019-03-15 NOTE — Progress Notes (Signed)
Occupational Therapy Session Note  Patient Details  Name: David [name] MRN: [id number] Date of Birth: 1932/04/07  Today's Date: 03/15/2019 OT Individual Time: 1601-0932 OT Individual Time Calculation (min): 58 min   Skilled Therapeutic Interventions/Progress Updates:    Pt greeted in bed, shaking head when asked if he wanted to use electrolarynx to communicate. Trach collar off and 02 sats in 80s upon arrival. RN was retrieved and placed him back on trach collar, elevated HOB and 02 sats remained between 92-97% throughout tx afterwards. Tx focus placed on alertness, praxis, and following 1 step instruction during self care tasks completed bedlevel. He required Total A overall, +2 for rolling Rt>Lt for perihygiene/brief change with pt having continuous BM during. RN in to assess buttocks due to multiple wounds and bleeding during session. He was able to wash his face and chest a little, but needed HOH to initiate (using L UE). Pt exhibited some proximal strength x1 instance when OT raised arm to wash. But for remainder of session, pt did not attempt to use R UE, even when wash cloth was placed in hand. He attempted to use the blanket x2 to wash himself vs wash cloth. Eyes were open <10% of the time, though was provided with max multimodal cuing to increase alertness. At end of session pt was repositioned for comfort in bed, left with B safety mitts, bed alarm set, and soft call bell on chest.    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); sacral wound Restrictions Weight Bearing Restrictions: No Vital Signs: Therapy Vitals Pulse Rate: 87 Resp: (!) 22 Patient Position (if appropriate): Lying Oxygen Therapy SpO2: 98 % O2 Device: Tracheostomy Collar O2 Flow Rate (L/min): 5  L/min FiO2 (%): 28 % Pain: No s/s pain during session    ADL: ADL Eating: Moderate assistance Where Assessed-Eating: Chair Grooming: Dependent Where Assessed-Grooming: Bed level Upper Body Bathing: Not assessed Lower Body Bathing: Not assessed Upper Body Dressing: Not assessed Lower Body Dressing: Not assessed Toileting: Not assessed Toilet Transfer: Not assessed Tub/Shower Transfer: Not assessed      Therapy/Group: Individual Therapy  Storie Heffern A Chloeanne Poteet 03/15/2019, 12:29 PM

## 2019-03-15 NOTE — Significant Event (Signed)
Rapid Response Event Note  Overview:  RN called to place pt on radar list for new onset of agitation and HR 30-130    Initial Focused Assessment: On arrival pt supine in bed, skin very warm to touch, cheeks flushed, upper airway wheezing noted, BS diminished Left side, pt able to follow commands but does not answer any questions, seems to be his norm per chart and RN.   Interventions: 20 g IV SL RAC  CBC, BMET, prealbumin BC x1  Plan of Care (if not transferred): Suggested to MD to recollect UA sample, pt currently on Rocephin for UTI, however CXR shows cardiomegaly with vascular congestion no signs suggesting PNA. MD placing orders. 40 mg Lasix IVP to be given by RN  Event Summary:   at      at          [facility], Sela Hua

## 2019-03-15 NOTE — Significant Event (Signed)
Rapid response RN called and informed of current patient situation so that she can keep him on her list to check on during the night.  RR nurse currently unavailable to come due to urgent needs in other areas of the hospital but will come assess him ASAP.  Brita Romp, RN

## 2019-03-15 NOTE — Progress Notes (Signed)
Physical Therapy Session Note  Patient Details  Name: David [name] MRN: [id number] Date of Birth: 23-Jan-1932  Today's Date: 03/15/2019 PT Individual Time: 1305-1420 PT Individual Time Calculation (min): 75 min   Short Term Goals: Week 1:  PT Short Term Goal 1 (Week 1): Pt will be able to tolerate OOB x 2 hours for increased overall activity tolerance PT Short Term Goal 2 (Week 1): Pt will be able to perform functional transfer with max assist +2 PT Short Term Goal 3 (Week 1): Pt will be able to demonstrate sitting balance during functional task with mod assist  Skilled Therapeutic Interventions/Progress Updates: Pt presented in bed agreeable to therapy. PTA removed mittens during session. Pt able to follow single step commands intermittently throughout session. Pt transferred to EOB with maxA x 1 and use of bed features. Pt performed AAROM LAQ of RLE for R NMR. Pt able to perform reaching activities while sitting EOB with pt maintaining good sitting balance. Pt becoming more distracted during session reaching for inappropriate objects such as O2 tubing requiring re-direction. Pt was able to tolerate sitting EOB approx 15 min at CGA. Pt visibly becoming fatigued pt returned to bed maxA 1-2 Caryl Pina tech arrived during transfer). Once pt laid on bed pt fell asleep. Pt then required max-total A for repositioning and rolling to side for pressure relief off buttock. Pt noted to have episode of bowl incontinence requiring total A x 2 for repositioning and peri-care. Pt left at end of session laying on side with soft touch button within reach and nsg aware.      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); sacral wound Restrictions Weight Bearing Restrictions: No General:   Vital  Signs: Therapy Vitals Pulse Rate: 83 Resp: (!) 22 BP: 127/78 Patient Position (if appropriate): Lying Oxygen Therapy SpO2: 98 % O2 Device: Room Air O2 Flow Rate (L/min): 5 L/min FiO2 (%): 28 %   Therapy/Group: Individual Therapy  Monserrath Junio  Amari Burnsworth, PTA  03/15/2019, 3:53 PM

## 2019-03-15 NOTE — Progress Notes (Signed)
Pt transported to CT and Xray via bed with side rails x 4. HOB elevated. Transported by 2 staff members of transport team. Will f/up when return.  Erie Noe, LPN

## 2019-03-15 NOTE — Progress Notes (Signed)
Called by RN with concerns that patient is more lethargic this afternoon and that heart rate has fluctuated from "29 to 130". Pt has known history of a-fib. I ordered an EKG which appears c/w prior exams demonstrating afib and ventricular bigeminy.   Pt appears to have been somewhat lethargic the majority of this past week per review of chart. Appeared no different this morning on exam when I rounded. However, given concerns of team and apparent change this afternoon I have ordered the following:  1. CBC, BMET, (went ahead and moved prealbumin to this afternoon also) 2. Head CT w/o 3. 2 view CXR 4. D/C seroquel  Otherwise patient appears stable. He is receiving rocephin currently for UTI. Will proceed with further orders pending above.    [name], MD, [facility] 03/15/2019

## 2019-03-16 ENCOUNTER — Inpatient Hospital Stay (HOSPITAL_COMMUNITY): Payer: Medicare Other

## 2019-03-16 ENCOUNTER — Inpatient Hospital Stay (HOSPITAL_COMMUNITY): Payer: Medicare Other | Admitting: Occupational Therapy

## 2019-03-16 ENCOUNTER — Telehealth: Payer: Self-pay | Admitting: *Deleted

## 2019-03-16 ENCOUNTER — Inpatient Hospital Stay (HOSPITAL_COMMUNITY): Payer: Medicare Other | Admitting: Speech Pathology

## 2019-03-16 LAB — CBC
HCT: 42.8 % (ref 39.0–52.0)
Hemoglobin: 13.6 g/dL (ref 13.0–17.0)
MCH: 32.3 pg (ref 26.0–34.0)
MCHC: 31.8 g/dL (ref 30.0–36.0)
MCV: 101.7 fL — ABNORMAL HIGH (ref 80.0–100.0)
Platelets: 203 10*3/uL (ref 150–400)
RBC: 4.21 MIL/uL — ABNORMAL LOW (ref 4.22–5.81)
RDW: 14.6 % (ref 11.5–15.5)
WBC: 8.3 10*3/uL (ref 4.0–10.5)
nRBC: 0 % (ref 0.0–0.2)

## 2019-03-16 LAB — BASIC METABOLIC PANEL
Anion gap: 14 (ref 5–15)
BUN: 25 mg/dL — ABNORMAL HIGH (ref 8–23)
CO2: 23 mmol/L (ref 22–32)
Calcium: 9.5 mg/dL (ref 8.9–10.3)
Chloride: 111 mmol/L (ref 98–111)
Creatinine, Ser: 1.04 mg/dL (ref 0.61–1.24)
GFR calc Af Amer: >60 mL/min (ref >60)
GFR calc non Af Amer: >60 mL/min (ref >60)
Glucose, Bld: 115 mg/dL — ABNORMAL HIGH (ref 70–99)
Potassium: 4.1 mmol/L (ref 3.5–5.1)
Sodium: 148 mmol/L — ABNORMAL HIGH (ref 135–145)

## 2019-03-16 LAB — GLUCOSE, CAPILLARY
Glucose-Capillary: 114 mg/dL — ABNORMAL HIGH (ref 70–99)
Glucose-Capillary: 105 mg/dL — ABNORMAL HIGH (ref 70–99)
Glucose-Capillary: 126 mg/dL — ABNORMAL HIGH (ref 70–99)
Glucose-Capillary: 130 mg/dL — ABNORMAL HIGH (ref 70–99)

## 2019-03-16 LAB — URINE CULTURE: Culture: NO GROWTH

## 2019-03-16 NOTE — Progress Notes (Signed)
Physical Therapy Session Note  Patient Details  Name: David [name] MRN: [id number] Date of Birth: 1932-08-17  Today's Date: 03/16/2019 PT Individual Time: 0905-0930 PT Individual Time Calculation (min): 25 min   Short Term Goals: Week 1:  PT Short Term Goal 1 (Week 1): Pt will be able to tolerate OOB x 2 hours for increased overall activity tolerance PT Short Term Goal 2 (Week 1): Pt will be able to perform functional transfer with max assist +2 PT Short Term Goal 3 (Week 1): Pt will be able to demonstrate sitting balance during functional task with mod assist  Skilled Therapeutic Interventions/Progress Updates: Pt presented in bed sleeping requiring external stimulus to arouse and repositioning to keep awake. Pt's vitals remained stable throughout session with SpO2 remaining >90% and HR between 80-95. Pt required total A for supine to sit at EOB with total A for scooting to R to align straight on bed. Pt with no LOB while in sitting this session. Participated in lateral scoot via SB total A x 2 with PTA providing max multimodal cues for leaning forward to facilitate scoot. Pt positioned in TIS change and left with belt alarm on, soft touch button within reach and nsg notified.      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); sacral wound Restrictions Weight Bearing Restrictions: No General:   Vital Signs: Therapy Vitals Pulse Rate: 73 Resp: 18 Patient Position (if appropriate): Lying Oxygen Therapy SpO2: 94 % O2 Device: Room Air Pain: Pain Assessment Pain Scale: Faces Pain Score: 0-No pain   Therapy/Group: Individual Therapy  Maritssa Haughton  Hancel Ion, PTA  03/16/2019, 1:23 PM

## 2019-03-16 NOTE — Progress Notes (Signed)
Occupational Therapy Note  Patient Details  Name: David [name] MRN: [id number] Date of Birth: February 11, 1932  Today's Date: 03/16/2019 OT Missed Time: 4 Minutes Missed Time Reason: Patient fatigue  RN reports pt has been awake and agitated all night. Pt asleep at this time and RN requests pt is allowed to rest. OT will attempt at next schedule time.   Gypsy Decant 03/16/2019, 7:19 AM

## 2019-03-16 NOTE — Progress Notes (Signed)
SLP Cancellation Note  Patient Details Name: David [name] MRN: [id number] DOB: 1932/08/22   Cancelled treatment:        Pt missed 45 minutes of skilled ST d/t being asleep. Per report, pt was agitated and didn't sleep prior evening.                                                                                                 Demetrick Eichenberger 03/16/2019, 9:34 AM

## 2019-03-16 NOTE — Progress Notes (Signed)
Occupational Therapy Session Note  Patient Details  Name: David [name] MRN: [id number] Date of Birth: [birth year]  Today's Date: 03/16/2019 OT Individual Time: 1030-1100 OT Individual Time Calculation (min): 30 min    Skilled Therapeutic Interventions/Progress Updates:    Upon entering the room, pt seated in tilt in space wheelchair and kicking LEs back and forth. Pt scheduled for in and out cath during this time and nursing asking therapist to return pt to bed. Slide board transfer with total A of 2 to bed. Second helper needed for sit >supine as well. OT repositioning pt for comfort. His O2 is at 96% or above and pt in no apparent distress. Pt positioned onto R side in side lying secondary to sacral wound. Bed lowered to floor, floor mat down, and soft call bell within reach.   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); sacral wound Restrictions Weight Bearing Restrictions: No General:   Vital Signs: Therapy Vitals Pulse Rate: 73 Resp: 18 Patient Position (if appropriate): Lying Oxygen Therapy SpO2: 94 % O2 Device: Room Air Pain:   ADL: ADL Eating: Moderate assistance Where Assessed-Eating: Chair Grooming: Dependent Where Assessed-Grooming: Bed level Upper Body Bathing: Not assessed Lower Body Bathing: Not assessed Upper Body Dressing: Not assessed Lower Body Dressing: Not assessed Toileting: Not assessed Toilet Transfer: Not assessed Tub/Shower Transfer: Not assessed   Therapy/Group: Individual Therapy  Gypsy Decant 03/16/2019, 11:49 AM

## 2019-03-16 NOTE — Progress Notes (Signed)
[place] PHYSICAL MEDICINE & REHABILITATION PROGRESS NOTE   Subjective/Complaints:  Appreciate Dr Naaman Plummer note, pt is alert today, no cough , audible wheeze but has O2 sats 98%  ROS: Limited due to cognitive/behavioral    Objective:   Dg Chest 2 View  Result Date: 03/15/2019 CLINICAL DATA:  Lethargy. EXAM: CHEST - 2 VIEW COMPARISON:  03/08/2019 FINDINGS: The cardio pericardial silhouette is enlarged. There is pulmonary vascular congestion with increased prominence of interstitial markings, concerning for dependent interstitial edema. Retrocardiac left base collapse/consolidation persists with small bilateral pleural effusions. Bones are diffusely demineralized. IMPRESSION: Cardiomegaly with vascular congestion and interstitial edema. Left base collapse/consolidation with small bilateral pleural effusions. Electronically Signed   By: Misty Stanley M.D.   On: 03/15/2019 17:47   Ct Head Wo Contrast  Result Date: 03/15/2019 CLINICAL DATA:  Patient has had increased lethargy and decreased responsiveness. EXAM: CT HEAD WITHOUT CONTRAST TECHNIQUE: Contiguous axial images were obtained from the base of the skull through the vertex without intravenous contrast. COMPARISON:  None. FINDINGS: Brain: No evidence of acute infarction, hemorrhage, extra-axial collection, ventriculomegaly, or mass effect. Old left parieto-occipital infarct with encephalomalacia. Old left thalamus lacunar infarct. Old left cerebellar infarct. Generalized cerebral atrophy. Periventricular white matter low attenuation likely secondary to microangiopathy. Vascular: Cerebrovascular atherosclerotic calcifications are noted. Skull: Negative for fracture or focal lesion. Sinuses/Orbits: Visualized portions of the orbits are unremarkable. Visualized portions of the paranasal sinuses and mastoid air cells are unremarkable. Other: None. IMPRESSION: No acute intracranial pathology. Electronically Signed   By: Kathreen Devoid   On: 03/15/2019  17:51   Recent Labs    03/15/19 1834  WBC 8.9  HGB 13.3  HCT 42.4  PLT 209   Recent Labs    03/15/19 1834  NA 145  K 4.0  CL 109  CO2 26  GLUCOSE 151*  BUN 28*  CREATININE 1.00  CALCIUM 9.2    Intake/Output Summary (Last 24 hours) at 03/16/2019 2505 Last data filed at 03/16/2019 0503 Gross per 24 hour  Intake 180 ml  Output 3700 ml  Net -3520 ml     Physical Exam: Vital Signs Blood pressure 128/79, pulse 88, temperature 97.9 F (36.6 C), temperature source Axillary, resp. rate 18, SpO2 96 %.  Constitutional: No distress . Vital signs reviewed. HEENT: EOMI, oral membranes moist Neck: supple Cardiovascular: RRR without murmur. No JVD    Respiratory: CTA Bilaterally without wheezes or rales. Normal effort    GI: BS +, non-tender, non-distended  Extremities: No clubbing, cyanosis, or edema Skin: No evidence of breakdown, no evidence of rash Neurologic: Cranial nerves II through XII intact, motor strength is 4/5 in Left and 3- RIght deltoid, bicep, tricep, grip,2- Right and 4/5 left  hip flexor, knee extensors, ankle dorsiflexor and plantar flexor Sensory exam cannot assess due to cognition/aphasia   Cerebellar exam cannot assess due to cognition as well as strength on Right side Musculoskeletal: . No joint swelling    Assessment/Plan: 1. Functional deficits secondary to Left PCA and PICA infarcts  which require 3+ hours per day of interdisciplinary therapy in a comprehensive inpatient rehab setting.  Physiatrist is providing close team supervision and 24 hour management of active medical problems listed below.  Physiatrist and rehab team continue to assess barriers to discharge/monitor patient progress toward functional and medical goals  Care Tool:  Bathing        Body parts bathed by helper: Right arm, Left upper leg, Left arm, Right lower leg, Chest, Abdomen, Left lower  leg, Face, Front perineal area, Buttocks, Right upper leg     Bathing assist Assist  Level: 2 Helpers     Upper Body Dressing/Undressing Upper body dressing   What is the patient wearing?: Hospital gown only    Upper body assist Assist Level: Dependent - Patient 0%    Lower Body Dressing/Undressing Lower body dressing      What is the patient wearing?: Incontinence brief     Lower body assist Assist for lower body dressing: 2 Helpers     Toileting Toileting    Toileting assist Assist for toileting: 2 Helpers     Transfers Chair/bed transfer  Transfers assist     Chair/bed transfer assist level: Dependent - mechanical lift     Locomotion Ambulation   Ambulation assist   Ambulation activity did not occur: Safety/medical concerns          Walk 10 feet activity   Assist  Walk 10 feet activity did not occur: Safety/medical concerns        Walk 50 feet activity   Assist Walk 50 feet with 2 turns activity did not occur: Safety/medical concerns         Walk 150 feet activity   Assist Walk 150 feet activity did not occur: Safety/medical concerns         Walk 10 feet on uneven surface  activity   Assist Walk 10 feet on uneven surfaces activity did not occur: Safety/medical concerns         Wheelchair     Assist Will patient use wheelchair at discharge?: Yes Type of Wheelchair: Manual Wheelchair activity did not occur: Safety/medical concerns(use of TIS due to positioning)  Wheelchair assist level: Dependent - Patient 0%(TIS)      Wheelchair 50 feet with 2 turns activity    Assist        Assist Level: Dependent - Patient 0%   Wheelchair 150 feet activity     Assist     Assist Level: Dependent - Patient 0%    Medical Problem List and Plan: 1.Decreased functional mobility, right homonymous hemianopsia as well as cognitive impairmentssecondary to bilateral posterior circulation infarctions as well as history of CVA- lethargy intermittent likely multifactorial and to a great extent CVA related  given location of infarct- work up for acute causes negative   --Continue CIR therapies including PT, OT, and SLP  2. Antithrombotics: -DVT/anticoagulation:Eliquis- monitor for anemia -antiplatelet therapy: N/A 3. Pain Management:Tylenol as needed 4. Mood:Provide emotional support -antipsychotic agents: Seroquel 25 mg daily at bedtime 5. Neuropsych: This patientis notcapable of making decisions on hisown behalf. 6. Skin/Wound Care:Routine skin checks 7. Fluids/Electrolytes/Nutrition:Routine in and out's with follow-up chemistries, elevated BUN enc fluid  -intake inconsistent overall poor, supplemental IVF needed  -recheck BMET and prealbumin Monday 8. History of atrial fibrillation. Cardiac rate controlled. Continue Eliquis 9. Hematuria/UTI. Followed by urology services Dr.Dahlstedt. Continue Flomax 0.4 mg twice daily. Complete course of Rocephin for UTI prior to voiding trial 10. Hypertension. Lopressor 12.5 mg twice a day. Monitor with increased mobility Vitals:   03/15/19 2321 03/16/19 0523  BP:  128/79  Pulse: 91 88  Resp: 20 18  Temp:  97.9 F (36.6 C)  SpO2: 97% 96%  borderline 4/19---observe 11. Type 2 diabetes mellitus. Sliding scale insulin. Check blood sugars before meals and at bedtime. Patient on Glucophage 500 mg twice daily prior to admission. Resume as needed CBG (last 3)  Recent Labs    03/15/19 1640 03/15/19 2117 03/16/19 8101  GLUCAP 162* 102* 114*   -good control 4/20 12. Hyperlipidemia. Zocor 13. History of laryngeal cancer. Status post laryngectomy, no trach tube large stoma, no need for continuous pulse ox. Monitor for any increased secretions    LOS: 4 days A FACE TO FACE EVALUATION WAS PERFORMED  Charlett Blake 03/16/2019, 7:12 AM

## 2019-03-16 NOTE — Progress Notes (Addendum)
Rn performed in and out cath on patient at 2018 for ua/cs. Urine appeared clear and non odorous. Cathed for 1100 cc. At around midnight, patient's diaper was soaked. He was bladder scanned for 611 and cathed for 1200. Patient appeared comfortable after in and out cath. No distress noted. Patient stated he wants to go home. Will continue to monitor.  Bladder scanned at 5 am for 558, in and out cathed for 900 cc.

## 2019-03-16 NOTE — Telephone Encounter (Addendum)
Patient's wife left a voicemali stating that he is in the hospital because he had a stroke. Mrs Stancil stated that patient also has dementia. Mrs. Sabedra stated that she is getting various reports from the hospital about her husband. Mrs. Stahl requested that Dr. Damita Dunnings review patient's hospital records and call her back to discuss her husband's condition. Mrs. Lapage stated that she really trust Dr. Damita Dunnings and would like to discuss her husband with him.

## 2019-03-16 NOTE — Progress Notes (Signed)
Physical Therapy Session Note  Patient Details  Name: David [name] MRN: [id number] Date of Birth: 10-01-1932  Today's Date: 03/16/2019 PT Individual Time: 1335-1435 PT Individual Time Calculation (min): 60 min   Short Term Goals: Week 1:  PT Short Term Goal 1 (Week 1): Pt will be able to tolerate OOB x 2 hours for increased overall activity tolerance PT Short Term Goal 2 (Week 1): Pt will be able to perform functional transfer with max assist +2 PT Short Term Goal 3 (Week 1): Pt will be able to demonstrate sitting balance during functional task with mod assist  Skilled Therapeutic Interventions/Progress Updates:    Patient in supine and awake and seemed agreeable to PT.  Removed safety mitts and pt reaching to pull out IV and to take off brief/gown, etc.  Supine to sit max A +2 for legs off bed and to lift trunk.  Seated EOB with S legs crossed and L leg under bed, attempting to adjust leg, but returns to ER and abduction.  Patient continuing to grab rail, etc with L UE. +2-3 total A for SBT to w/c due to pt grabbing onto rails on bed with L hand.  Once in the chair able to re-apply mitt to both hands.  Pushed pt in chair to dayroom and positioned to L of window to encouraged R side attention, but to no avail after much cues, and moving head to R.  Patient requesting back to room.  Used mechanical lift for back to bed from w/c for safety.  Positioned pad rolling with mod to max A and scooted up in bed +2 total A.  Left with HOB elevated on RA with continuous pulse ox re-applied with VSS.  Left with TV on, soft touch call bell in reach and bed alarm set.   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); sacral wound Restrictions Weight Bearing Restrictions: No General: PT  Amount of Missed Time (min): 15 Minutes PT Missed Treatment Reason: Increased agitation Pain: Pain Assessment Pain Scale: Faces Pain Score: 0-No pain Faces Pain Scale: No hurt    Therapy/Group: Individual Therapy  Reginia Naas  Magda Kiel, PT 03/16/2019, 2:41 PM

## 2019-03-17 ENCOUNTER — Inpatient Hospital Stay (HOSPITAL_COMMUNITY): Payer: Medicare Other | Admitting: Occupational Therapy

## 2019-03-17 ENCOUNTER — Inpatient Hospital Stay (HOSPITAL_COMMUNITY): Payer: Medicare Other

## 2019-03-17 ENCOUNTER — Inpatient Hospital Stay (HOSPITAL_COMMUNITY): Payer: Medicare Other | Admitting: *Deleted

## 2019-03-17 ENCOUNTER — Inpatient Hospital Stay (HOSPITAL_COMMUNITY): Payer: Medicare Other | Admitting: Speech Pathology

## 2019-03-17 LAB — GLUCOSE, CAPILLARY
Glucose-Capillary: 96 mg/dL (ref 70–99)
Glucose-Capillary: 121 mg/dL — ABNORMAL HIGH (ref 70–99)
Glucose-Capillary: 128 mg/dL — ABNORMAL HIGH (ref 70–99)

## 2019-03-17 NOTE — Progress Notes (Signed)
Occupational Therapy Session Note  Patient Details  Name: David [name] MRN: [id number] Date of Birth: [birth year]  Today's Date: 03/17/2019 OT Individual Time: 6546-5035 OT Individual Time Calculation (min): 45 min  and Today's Date: 03/17/2019 OT Missed Time: 15 Minutes Missed Time Reason: Patient fatigue    Skilled Therapeutic Interventions/Progress Updates:    Upon entering the room, pt supine in bed with no c/o of pain this session. Pt incontinent of bowel this session and rolling L <> R with total A of and total A for hygiene and clothing management. Pt utilizing electrolarynx with cuing to do so and able to understanding 75% of the time. Pt needing max cuing for orientation and perseverating on [facility] when asked other questions. Pt performed supine >sit with max A to EOB and sitting with min A for 5 minutes. Pt standing in stedy x 4 reps for 1 min, 1 min, 30 sec,and 20 seconds respectively. Pt needing mod - max A +2 to stand in stedy. HR increased to 120's and then to 140's on last attempt. O2 dropping to 85% but able to return to 93-97% very quickly. Pt returning to bed secondary to fatigue with +2 assistance for sit >supine. Pt repositioned for comfort. Bed alarm set, bed lowered, mats placed on floor, and call bell within reach. B mitts donned for safety as well. Pt in no apparent distress as OT exited the room.   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); sacral wound Restrictions Weight Bearing Restrictions: No General: General OT Amount of Missed Time: 15 Minutes Vital Signs:  Pain: Pain Assessment Pain Scale: Faces Faces Pain Scale: No hurt ADL: ADL Eating: Moderate assistance Where Assessed-Eating: Chair Grooming: Dependent Where  Assessed-Grooming: Bed level Upper Body Bathing: Not assessed Lower Body Bathing: Not assessed Upper Body Dressing: Not assessed Lower Body Dressing: Not assessed Toileting: Not assessed Toilet Transfer: Not assessed Tub/Shower Transfer: Not assessed   Therapy/Group: Individual Therapy  Gypsy Decant 03/17/2019, 4:01 PM

## 2019-03-17 NOTE — Progress Notes (Signed)
Physical Therapy Session Note  Patient Details  Name: David [name] MRN: [id number] Date of Birth: 1932/09/23  Today's Date: 03/17/2019 PT Individual Time: 480 783 1403 and 1333-1405 PT Individual Time Calculation (min): 60 min and 32 min   Short Term Goals: Week 1:  PT Short Term Goal 1 (Week 1): Pt will be able to tolerate OOB x 2 hours for increased overall activity tolerance PT Short Term Goal 2 (Week 1): Pt will be able to perform functional transfer with max assist +2 PT Short Term Goal 3 (Week 1): Pt will be able to demonstrate sitting balance during functional task with mod assist  Skilled Therapeutic Interventions/Progress Updates: Pt presented in bed sleeping but easily aroused. Pt noted to be on RA with stats in mid 80s, per [place] no orders for supplemental O2 and to monitor. Pt performed rolling L/R with maxA and max multimodal cues for placement of sling for mechanical lift. Pt noted to desat to mid 70s when rolling however recovered to high 80s quickly. Pt transferred to [facility] chair via [place] to protect integrity of sacral wound. Once in chair vitals stable but pt immediately sleeping. Pt required moderate stimulation for arousal however unable to sustain arousal level. Pt transported to ortho gym with pt becoming somewhat alert. SpO2 remained >88% on RA while out of room. PTA attempting to have pt reach for cards while sitting up however at present time pt unable to follow commands or became lethargic. Pt transported back to room and remained in TIS with belt alarm on, and mittens placed on B hands with soft touch button within reach.   Tx2: Pt presented in bed agreeable to therapy. Pt required maxA x 2 supine to sit at EOB with use of bed features and max multimodal cues. Pt demonstrated fair sitting balance and was not reaching for objects while in sitting. Pt nodding head in agreement to attempt standing. Performed STS x 2 with maxA x 2 and PTA stabilizing R knee. Pt able to perform  small wt shifting while standing with PTA and Tech in "3 musketeers" position. Pt was able to tolerance standing approx 30sec-37min each bout and noted to have BM during second bout of standing. Pt returned to bed maxA x 2 and performed rolling L/R maxA x1-2 while PTA performed peri-care total A. Pt remained in bed at end of session with pillow propped on R side for offloading, mittens donned, and soft touch bell within reach.      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); sacral wound Restrictions Weight Bearing Restrictions: No    Therapy/Group: Individual Therapy  Kaileena Obi  Glinda Natzke, PTA  03/17/2019, 4:28 PM

## 2019-03-17 NOTE — Progress Notes (Signed)
[name] PHYSICAL MEDICINE & REHABILITATION PROGRESS NOTE   Subjective/Complaints:  Per CNA alert , eating well and slept well last noc Pt aphasic and dysarthric, confused as well as issues with chronic trach   ROS: Limited due to cognitive/behavioral    Objective:   Dg Chest 2 View  Result Date: 03/15/2019 CLINICAL DATA:  Lethargy. EXAM: CHEST - 2 VIEW COMPARISON:  03/08/2019 FINDINGS: The cardio pericardial silhouette is enlarged. There is pulmonary vascular congestion with increased prominence of interstitial markings, concerning for dependent interstitial edema. Retrocardiac left base collapse/consolidation persists with small bilateral pleural effusions. Bones are diffusely demineralized. IMPRESSION: Cardiomegaly with vascular congestion and interstitial edema. Left base collapse/consolidation with small bilateral pleural effusions. Electronically Signed   By: Misty Stanley M.D.   On: 03/15/2019 17:47   Ct Head Wo Contrast  Result Date: 03/15/2019 CLINICAL DATA:  Patient has had increased lethargy and decreased responsiveness. EXAM: CT HEAD WITHOUT CONTRAST TECHNIQUE: Contiguous axial images were obtained from the base of the skull through the vertex without intravenous contrast. COMPARISON:  None. FINDINGS: Brain: No evidence of acute infarction, hemorrhage, extra-axial collection, ventriculomegaly, or mass effect. Old left parieto-occipital infarct with encephalomalacia. Old left thalamus lacunar infarct. Old left cerebellar infarct. Generalized cerebral atrophy. Periventricular white matter low attenuation likely secondary to microangiopathy. Vascular: Cerebrovascular atherosclerotic calcifications are noted. Skull: Negative for fracture or focal lesion. Sinuses/Orbits: Visualized portions of the orbits are unremarkable. Visualized portions of the paranasal sinuses and mastoid air cells are unremarkable. Other: None. IMPRESSION: No acute intracranial pathology. Electronically Signed    By: Kathreen Devoid   On: 03/15/2019 17:51   Recent Labs    03/15/19 1834 03/16/19 0757  WBC 8.9 8.3  HGB 13.3 13.6  HCT 42.4 42.8  PLT 209 203   Recent Labs    03/15/19 1834 03/16/19 0757  NA 145 148*  K 4.0 4.1  CL 109 111  CO2 26 23  GLUCOSE 151* 115*  BUN 28* 25*  CREATININE 1.00 1.04  CALCIUM 9.2 9.5    Intake/Output Summary (Last 24 hours) at 03/17/2019 0748 Last data filed at 03/17/2019 0230 Gross per 24 hour  Intake 406 ml  Output 925 ml  Net -519 ml     Physical Exam: Vital Signs Blood pressure (!) 130/98, pulse 85, temperature 98.8 F (37.1 C), resp. rate 20, SpO2 98 %.  Constitutional: No distress . Vital signs reviewed. HEENT: EOMI, oral membranes moist Neck: supple Cardiovascular: RRR without murmur. No JVD    Respiratory: CTA Bilaterally without wheezes or rales. Normal effort    GI: BS +, non-tender, non-distended  Extremities: No clubbing, cyanosis, or edema Skin: No evidence of breakdown, no evidence of rash Neurologic: Cranial nerves II through XII intact, motor strength is 4/5 in Left and 3- RIght deltoid, bicep, tricep, grip,2- Right and 4/5 left  hip flexor, knee extensors, ankle dorsiflexor and plantar flexor Sensory exam cannot assess due to cognition/aphasia   Cerebellar exam cannot assess due to cognition as well as strength on Right side Musculoskeletal: . No joint swelling    Assessment/Plan: 1. Functional deficits secondary to Left PCA and PICA infarcts  which require 3+ hours per day of interdisciplinary therapy in a comprehensive inpatient rehab setting.  Physiatrist is providing close team supervision and 24 hour management of active medical problems listed below.  Physiatrist and rehab team continue to assess barriers to discharge/monitor patient progress toward functional and medical goals  Care Tool:  Bathing  Body parts bathed by helper: Right arm, Left upper leg, Left arm, Right lower leg, Chest, Abdomen, Left lower  leg, Face, Front perineal area, Buttocks, Right upper leg     Bathing assist Assist Level: 2 Helpers     Upper Body Dressing/Undressing Upper body dressing   What is the patient wearing?: Hospital gown only    Upper body assist Assist Level: Dependent - Patient 0%    Lower Body Dressing/Undressing Lower body dressing      What is the patient wearing?: Incontinence brief     Lower body assist Assist for lower body dressing: 2 Helpers     Toileting Toileting    Toileting assist Assist for toileting: 2 Helpers     Transfers Chair/bed transfer  Transfers assist     Chair/bed transfer assist level: Dependent - mechanical lift(bed to chair via SBT 2-3 A due to agitation)     Locomotion Ambulation   Ambulation assist   Ambulation activity did not occur: Safety/medical concerns          Walk 10 feet activity   Assist  Walk 10 feet activity did not occur: Safety/medical concerns        Walk 50 feet activity   Assist Walk 50 feet with 2 turns activity did not occur: Safety/medical concerns         Walk 150 feet activity   Assist Walk 150 feet activity did not occur: Safety/medical concerns         Walk 10 feet on uneven surface  activity   Assist Walk 10 feet on uneven surfaces activity did not occur: Safety/medical concerns         Wheelchair     Assist Will patient use wheelchair at discharge?: Yes Type of Wheelchair: Manual Wheelchair activity did not occur: Safety/medical concerns(use of TIS due to positioning)  Wheelchair assist level: Dependent - Patient 0%(TIS)      Wheelchair 50 feet with 2 turns activity    Assist        Assist Level: Dependent - Patient 0%   Wheelchair 150 feet activity     Assist     Assist Level: Dependent - Patient 0%    Medical Problem List and Plan: 1.Decreased functional mobility, right homonymous hemianopsia as well as cognitive impairmentssecondary to bilateral  posterior circulation infarctions as well as history of CVA- lethargy intermittent likely multifactorial and to a great extent CVA related given location of infarct- work up for acute causes negative Functioning better after good night's sleep Team conf in am   --Continue CIR therapies including PT, OT, and SLP  2. Antithrombotics: -DVT/anticoagulation:Eliquis- monitor for anemia -antiplatelet therapy: N/A 3. Pain Management:Tylenol as needed 4. Mood:Provide emotional support -antipsychotic agents: Seroquel 25 mg daily at bedtime 5. Neuropsych: This patientis notcapable of making decisions on hisown behalf. 6. Skin/Wound Care:Routine skin checks 7. Fluids/Electrolytes/Nutrition:Routine in and out's with follow-up chemistries, elevated BUN enc fluid  -intake I 476ml  -recheck BMET and prealbumin Monday 8. History of atrial fibrillation. Cardiac rate controlled. Continue Eliquis 9. Hematuria/UTI. Followed by urology services Dr.Dahlstedt. Continue Flomax 0.4 mg twice daily. Complete course of Rocephin for UTI prior to voiding trial 10. Hypertension. Lopressor 12.5 mg twice a day. Monitor with increased mobility Vitals:   03/17/19 0323 03/17/19 0555  BP:  (!) 130/98  Pulse: 81 85  Resp: 19 20  Temp:  98.8 F (37.1 C)  SpO2: 95% 98%  borderline 4/19---observe 11. Type 2 diabetes mellitus. Sliding scale insulin. Check blood  sugars before meals and at bedtime. Patient on Glucophage 500 mg twice daily prior to admission. Resume as needed CBG (last 3)  Recent Labs    03/16/19 1627 03/16/19 2112 03/17/19 0635  GLUCAP 126* 130* 96   -good control 4/21 12. Hyperlipidemia. Zocor 13. History of laryngeal cancer. Status post laryngectomy, no trach tube large stoma, no need for continuous pulse ox.Difficult to occlude trach with exception of gloved finger Monitor for any increased secretions  14.  Mild HyperNatremia- may be fluid related will  monitor  LOS: 5 days A FACE TO FACE EVALUATION WAS PERFORMED  Charlett Blake 03/17/2019, 7:48 AM

## 2019-03-17 NOTE — Plan of Care (Signed)
  Problem: Consults Goal: RH STROKE PATIENT EDUCATION Description See Patient Education module for education specifics with max assist  Outcome: Progressing Goal: Nutrition Consult-if indicated Outcome: Progressing Goal: Diabetes Guidelines if Diabetic/Glucose > 140 Description If diabetic or lab glucose is > 140 mg/dl - Initiate Diabetes/Hyperglycemia Guidelines & Document Interventions  Outcome: Progressing   Problem: RH BOWEL ELIMINATION Goal: RH STG MANAGE BOWEL WITH ASSISTANCE Description STG Manage Bowel with Max Assistance.  Outcome: Progressing   Problem: RH BLADDER ELIMINATION Goal: RH STG MANAGE BLADDER WITH ASSISTANCE Description STG Manage Bladder With Max Assistance  Outcome: Progressing   Problem: RH SAFETY Goal: RH STG ADHERE TO SAFETY PRECAUTIONS W/ASSISTANCE/DEVICE Description STG Adhere to Safety Precautions With Max Assistance/Device.  Outcome: Progressing

## 2019-03-17 NOTE — Progress Notes (Signed)
Speech Language Pathology Daily Session Note  Patient Details  Name: David [name] MRN: [id number] Date of Birth: 1932-08-25  Today's Date: 03/17/2019 SLP Individual Time: 1000-1100 SLP Individual Time Calculation (min): 60 min  Short Term Goals: Week 1: SLP Short Term Goal 1 (Week 1): Patient will maximize speech intelligibility to 50% at the phrase level with Mod A multimodal cues for use of strategies and placement of electrolarynx SLP Short Term Goal 2 (Week 1): Patient will verbalize wants/needs at the phrase level with Mod A verbal cues for word-finding.  SLP Short Term Goal 3 (Week 1): Patient will demonstrate sustained attention to functional tasks for 5 minutes with Mod A verbal cues.  SLP Short Term Goal 4 (Week 1): Patient will demonstrate orientation to place, time, and situation with Mod A multimodal cues.  SLP Short Term Goal 5 (Week 1): Patient will attend to right field of environment during functional tasks with Mod A multimodal cues.   Skilled Therapeutic Interventions: Pt was seen for skilled ST intervention targeting goals for dysphagia and cognitive-linguistics. Pt was in is chair, sleeping upon arrival of SLP, but awakened following use of verbal stim, cold wet washcloth, and tactile stim. SLP facilitated attention and alertness by engaging pt with [place]. Pt was able to maintain alertness for the entire 60 minute session today, and attended to task well. Pt utilized electrolarynx to communicate verbally, and actually "sang" along with one of the songs playing. Speech was ~50 intelligible to unfamiliar listener. Pt benefited from repetition. Pt was able to request something to drink with good intelligibility, and was observed drinking Coke. No obvious oral issues or nasal regurgitation noticed despite pt drinking the entire can.  Pt was disoriented to place and time. Orientation information was posted in pt room to facilitate accurate orientation. Decreased recall of  this information was evident later in the session, with cues required to use environmental cues to answer correctly. During session, several areas of bleeding were noted from sores on his arms. RN was informed.   Pt was left in chair with alarm on, all needs within reach, bilateral mitts reapplied. Continue with current plan of care.   Pain Pain Assessment Pain Scale: Faces Faces Pain Scale: No hurt  Therapy/Group: Individual Therapy   Celia B. Quentin Ore, [facility], CCC-SLP Speech Language Pathologist  Shonna Chock 03/17/2019, 12:17 PM

## 2019-03-18 ENCOUNTER — Inpatient Hospital Stay (HOSPITAL_COMMUNITY): Payer: Medicare Other | Admitting: Occupational Therapy

## 2019-03-18 ENCOUNTER — Inpatient Hospital Stay (HOSPITAL_COMMUNITY): Payer: Medicare Other | Admitting: Speech Pathology

## 2019-03-18 ENCOUNTER — Ambulatory Visit (HOSPITAL_COMMUNITY): Payer: Medicare Other | Admitting: *Deleted

## 2019-03-18 LAB — GLUCOSE, CAPILLARY
Glucose-Capillary: 104 mg/dL — ABNORMAL HIGH (ref 70–99)
Glucose-Capillary: 94 mg/dL (ref 70–99)
Glucose-Capillary: 123 mg/dL — ABNORMAL HIGH (ref 70–99)
Glucose-Capillary: 124 mg/dL — ABNORMAL HIGH (ref 70–99)

## 2019-03-18 LAB — BASIC METABOLIC PANEL
Anion gap: 15 (ref 5–15)
BUN: 27 mg/dL — ABNORMAL HIGH (ref 8–23)
CO2: 22 mmol/L (ref 22–32)
Calcium: 9.7 mg/dL (ref 8.9–10.3)
Chloride: 111 mmol/L (ref 98–111)
Creatinine, Ser: 1.04 mg/dL (ref 0.61–1.24)
GFR calc Af Amer: >60 mL/min (ref >60)
GFR calc non Af Amer: >60 mL/min (ref >60)
Glucose, Bld: 139 mg/dL — ABNORMAL HIGH (ref 70–99)
Potassium: 4.7 mmol/L (ref 3.5–5.1)
Sodium: 148 mmol/L — ABNORMAL HIGH (ref 135–145)

## 2019-03-18 NOTE — Progress Notes (Addendum)
Speech Language Pathology Daily Session Note  Patient Details  Name: David [name] MRN: [id number] Date of Birth: 1931-12-26  Today's Date: 03/18/2019 SLP Individual Time: 1478-2956 SLP Individual Time Calculation (min): 65 min  Short Term Goals: Week 1: SLP Short Term Goal 1 (Week 1): Patient will maximize speech intelligibility to 50% at the phrase level with Mod A multimodal cues for use of strategies and placement of electrolarynx SLP Short Term Goal 2 (Week 1): Patient will verbalize wants/needs at the phrase level with Mod A verbal cues for word-finding.  SLP Short Term Goal 3 (Week 1): Patient will demonstrate sustained attention to functional tasks for 5 minutes with Mod A verbal cues.  SLP Short Term Goal 4 (Week 1): Patient will demonstrate orientation to place, time, and situation with Mod A multimodal cues.  SLP Short Term Goal 5 (Week 1): Patient will attend to right field of enviornment during functional tasks with Mod A multimodal cues.   Skilled Therapeutic Interventions: Pt was seen for skilled ST intervention targeting goals for dysphagia and cognitive-linguistics. Pt was noted to be awake and alert today, irritated and perseverative. Pt's speech was much more difficult to understand today, and he was not responsive to cues and encouragement to reduce rate of speech. Pt kept asking SLP to "wash it", referring to cream container and electrolarynx batteries. This was completed per pt request, however, he kept asking for it to be done, and confirmed that is what he was asking. Pt was difficult to redirect today.   Pt was also observed with regular consistency solids and thin liquids, due to concern of nasal regurgitation voiced by nursing. Pt did exhibit regurgitation of liquids, but not solid trials with SLP. Nurse tech reported nasal regurgitation of solid items from breakfast tray this morning. SLP consulted with RN regarding onset of this new occurrence. No nasal regurgitation  has been documented prior to yesterday. This raises concern for effectiveness of palatal elevation (CN X). Given laryngectomy, pt is unable to aspirate unless there is a tracheo-esophageal fistula. Concerns were relayed to PA.   Pt was left in chair with alarm on, all needs within reach, at nurses station. Continue with current plan of care.   Pain Pain Assessment Pain Scale: Faces Faces Pain Scale: Hurts little more Pain Location: (unable to state) Pain Descriptors / Indicators: Grimacing  Therapy/Group: Individual Therapy   Shy Guallpa B. Quentin Ore, [facility], CCC-SLP Speech Language Pathologist   Shonna Chock 03/18/2019, 1:31 PM

## 2019-03-18 NOTE — Progress Notes (Signed)
During shift patient in wheelchair noted to be agitated. Patient working  with therapy and put in bed after session. This Probation officer I/Ocath patient as ordered.Multiiple attempts were made to reposition patient as ordered for wound to sacrum  however able to turn self back into the postion he prefers to be in.Patient noted to have 3 episodes to nasal regurgitation Pa Dan aware.  Safety mitten on while in bed telesitter order in place. [place].

## 2019-03-18 NOTE — Progress Notes (Signed)
[place] PHYSICAL MEDICINE & REHABILITATION PROGRESS NOTE   Subjective/Complaints:  Eating well when fed, pt can feed self when mitts are removed but very impulsive  ROS: Limited due to cognitive/behavioral    Objective:   No results found. Recent Labs    03/15/19 1834 03/16/19 0757  WBC 8.9 8.3  HGB 13.3 13.6  HCT 42.4 42.8  PLT 209 203   Recent Labs    03/15/19 1834 03/16/19 0757  NA 145 148*  K 4.0 4.1  CL 109 111  CO2 26 23  GLUCOSE 151* 115*  BUN 28* 25*  CREATININE 1.00 1.04  CALCIUM 9.2 9.5    Intake/Output Summary (Last 24 hours) at 03/18/2019 0727 Last data filed at 03/18/2019 0259 Gross per 24 hour  Intake 690 ml  Output 1050 ml  Net -360 ml     Physical Exam: Vital Signs Blood pressure 131/77, pulse 87, temperature 98.6 F (37 C), temperature source Oral, resp. rate 18, SpO2 96 %.  Constitutional: No distress . Vital signs reviewed. HEENT: EOMI, oral membranes moist Neck: supple Cardiovascular: RRR without murmur. No JVD    Respiratory: CTA Bilaterally without wheezes or rales. Normal effort    GI: BS +, non-tender, non-distended  Extremities: No clubbing, cyanosis, or edema Skin: No evidence of breakdown, no evidence of rash Neurologic: Cranial nerves II through XII intact, motor strength is 4/5 in Left and 3- RIght deltoid, bicep, tricep, grip,2- Right and 4/5 left  hip flexor, knee extensors, ankle dorsiflexor and plantar flexor Sensory exam cannot assess due to cognition/aphasia   Cerebellar exam cannot assess due to cognition as well as strength on Right side Musculoskeletal: . No joint swelling    Assessment/Plan: 1. Functional deficits secondary to Left PCA and PICA infarcts  which require 3+ hours per day of interdisciplinary therapy in a comprehensive inpatient rehab setting.  Physiatrist is providing close team supervision and 24 hour management of active medical problems listed below.  Physiatrist and rehab team continue to  assess barriers to discharge/monitor patient progress toward functional and medical goals  Care Tool:  Bathing        Body parts bathed by helper: Right arm, Left upper leg, Left arm, Right lower leg, Chest, Abdomen, Left lower leg, Face, Front perineal area, Buttocks, Right upper leg     Bathing assist Assist Level: 2 Helpers     Upper Body Dressing/Undressing Upper body dressing   What is the patient wearing?: Hospital gown only    Upper body assist Assist Level: Dependent - Patient 0%    Lower Body Dressing/Undressing Lower body dressing      What is the patient wearing?: Incontinence brief     Lower body assist Assist for lower body dressing: 2 Helpers     Toileting Toileting    Toileting assist Assist for toileting: 2 Helpers     Transfers Chair/bed transfer  Transfers assist     Chair/bed transfer assist level: Dependent - mechanical lift(bed to chair via SBT 2-3 A due to agitation)     Locomotion Ambulation   Ambulation assist   Ambulation activity did not occur: Safety/medical concerns          Walk 10 feet activity   Assist  Walk 10 feet activity did not occur: Safety/medical concerns        Walk 50 feet activity   Assist Walk 50 feet with 2 turns activity did not occur: Safety/medical concerns         Walk 150  feet activity   Assist Walk 150 feet activity did not occur: Safety/medical concerns         Walk 10 feet on uneven surface  activity   Assist Walk 10 feet on uneven surfaces activity did not occur: Safety/medical concerns         Wheelchair     Assist Will patient use wheelchair at discharge?: Yes Type of Wheelchair: Manual Wheelchair activity did not occur: Safety/medical concerns(use of TIS due to positioning)  Wheelchair assist level: Dependent - Patient 0%(TIS)      Wheelchair 50 feet with 2 turns activity    Assist        Assist Level: Dependent - Patient 0%   Wheelchair 150  feet activity     Assist     Assist Level: Dependent - Patient 0%    Medical Problem List and Plan: 1.Decreased functional mobility, right homonymous hemianopsia as well as cognitive impairmentssecondary to bilateral posterior circulation infarctions as well as history of CVA- Team conference today please see physician documentation under team conference tab, met with team face-to-face to discuss problems,progress, and goals. Formulized individual treatment plan based on medical history, underlying problem and comorbidities.  --Continue CIR therapies including PT, OT, and SLP  2. Antithrombotics: -DVT/anticoagulation:Eliquis- monitor for anemia -antiplatelet therapy: N/A 3. Pain Management:Tylenol as needed 4. Mood:Provide emotional support -antipsychotic agents: Seroquel 25 mg daily at bedtime 5. Neuropsych: This patientis notcapable of making decisions on hisown behalf. 6. Skin/Wound Care:Routine skin checks 7. Fluids/Electrolytes/Nutrition:Routine in and out's with follow-up chemistries, elevated BUN enc fluid  -intake I 613m  8. History of atrial fibrillation. Cardiac rate controlled. Continue Eliquis 9. Hematuria/UTI. Followed by urology services Dr.Dahlstedt. Continue Flomax 0.4 mg twice daily. Complete course of Rocephin for UTI prior to voiding trial 10. Hypertension. Lopressor 12.5 mg twice a day. Monitor with increased mobility Vitals:   03/18/19 0507 03/18/19 0719  BP: 131/77   Pulse: 88 87  Resp: 18 18  Temp: 98.6 F (37 C)   SpO2: 99% 96%  controlled 4/22 11. Type 2 diabetes mellitus. Sliding scale insulin. Check blood sugars before meals and at bedtime. Patient on Glucophage 500 mg twice daily prior to admission. Resume as needed CBG (last 3)  Recent Labs    03/17/19 1143 03/17/19 1639 03/18/19 0647  GLUCAP 121* 128* 104*   -good control 4/22 12. Hyperlipidemia. Zocor 13. History of laryngeal cancer. Status post  laryngectomy, no trach tube large stoma, no need for continuous pulse ox.Difficult to occlude trach with exception of gloved finger Monitor for any increased secretions  14.  Mild HyperNatremia- may be fluid related will monitor  LOS: 6 days A FACE TO FACE EVALUATION WAS PERFORMED  ACharlett Blake4/22/2020, 7:27 AM

## 2019-03-18 NOTE — Telephone Encounter (Signed)
Late entry.  I called his wife yesterday.  I had reviewed the inpatient notes in the meantime.  I asked her to direct her specific questions about his care to the inpatient team since they are currently managing his care.  She understood the rationale for that.  We talked about the situation that led to his hospitalization and his treatment plan in the meantime.  I appreciate and agree with the decisions made by the inpatient team.  This is been a difficult situation for both the patient and his wife given that she cannot go to the hospital during the coronavirus pandemic due to visitor restrictions.  She is trying to adjust to the situation as best she can.  I did tell her that the inpatient team had a very good reputation and had helped a lot of patients in the past.  I was glad to talk to her and she thanked me for the call.  I will defer to the inpatient team in the meantime otherwise.

## 2019-03-18 NOTE — Progress Notes (Signed)
Occupational Therapy Session Note  Patient Details  Name: David [name] MRN: [id number] Date of Birth: [birth year]  Today's Date: 03/18/2019 OT Individual Time: 7793-9030 OT Individual Time Calculation (min): 70 min    Short Term Goals: Week 1:  OT Short Term Goal 1 (Week 1): Pt will be able to transfer with assist of 1 person. OT Short Term Goal 2 (Week 1): Pt will maintain dynamic sititng balance on EOB for 3 minutes or more with min A for self care tasks. OT Short Term Goal 3 (Week 1): Pt will perform UB dressing with max A consistently to decrease assistance with self care tasks.  Skilled Therapeutic Interventions/Progress Updates:    Upon entering the room, pt supine in bed with RN finishing I & O cath. Pt agreeable to therapy this session and appearing to be more alert. Supine >sit with max A to EOB. Upon sitting up, OT noticing mouth full of food use of suction to clear mouth. Oral care performed with suction toothbrush by therapist. Pt then having more food present in mouth needed to be cleared via suction. While seated on EOB pt donning pull over shirt with total A. Pt standing from elevated bed into stedy with max A of 2 and transferred into wheelchair. While in standing, OT assisted pt with total A to don LB clothing. Pt able to attend to task to hold cards in L hand while therapist reading pt his mail this session. Pt laughing and smiling appropriately to mail responses. Pt remained in tilt in space wheelchair with chair alarm donned and mittens on at RN station for safety.  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); sacral wound Restrictions Weight Bearing Restrictions: No    ADL: ADL Eating: Moderate assistance Where Assessed-Eating: Chair Grooming:  Dependent Where Assessed-Grooming: Bed level Upper Body Bathing: Not assessed Lower Body Bathing: Not assessed Upper Body Dressing: Not assessed Lower Body Dressing: Not assessed Toileting: Not assessed Toilet Transfer: Not assessed Tub/Shower Transfer: Not assessed   Therapy/Group: Individual Therapy  Gypsy Decant 03/18/2019, 12:11 PM

## 2019-03-18 NOTE — Plan of Care (Signed)
  Problem: Consults Goal: RH STROKE PATIENT EDUCATION Description See Patient Education module for education specifics with max assist  Outcome: Progressing Goal: Nutrition Consult-if indicated Outcome: Progressing Goal: Diabetes Guidelines if Diabetic/Glucose > 140 Description If diabetic or lab glucose is > 140 mg/dl - Initiate Diabetes/Hyperglycemia Guidelines & Document Interventions  Outcome: Progressing   Problem: RH BOWEL ELIMINATION Goal: RH STG MANAGE BOWEL WITH ASSISTANCE Description STG Manage Bowel with Max Assistance.  Outcome: Progressing   Problem: RH BLADDER ELIMINATION Goal: RH STG MANAGE BLADDER WITH ASSISTANCE Description STG Manage Bladder With Max Assistance  Outcome: Progressing   Problem: RH SAFETY Goal: RH STG ADHERE TO SAFETY PRECAUTIONS W/ASSISTANCE/DEVICE Description STG Adhere to Safety Precautions With Max Assistance/Device.  Outcome: Progressing

## 2019-03-18 NOTE — Progress Notes (Signed)
Physical Therapy Session Note  Patient Details  Name: David [name] MRN: [id number] Date of Birth: 09-30-1932  Today's Date: 03/18/2019 PT Individual Time: 1335-1435 PT Individual Time Calculation (min): 60 min   Short Term Goals: Week 1:  PT Short Term Goal 1 (Week 1): Pt will be able to tolerate OOB x 2 hours for increased overall activity tolerance PT Short Term Goal 2 (Week 1): Pt will be able to perform functional transfer with max assist +2 PT Short Term Goal 3 (Week 1): Pt will be able to demonstrate sitting balance during functional task with mod assist  Skilled Therapeutic Interventions/Progress Updates: Pt presented in TIS visibly upset/agitated. Pt unable to communicate why he's upset even with use of vocal stimulator. Pt resistive to PTA placing B feet on leg rest for transporting away from nsg station however pt eventually compliant. Pt transported to day room and placed by window with David [name], RT present. PTA played music for pt with pt seeming calming down some however continued reaching for something on floor and trying to pull at windowsill. Pt then trying to push away from window with PTA moving TIS so as to not cause harm to pt. Pt transported to ortho gym to attempt to use UBE with pt able to complete x 1 cycle then pulling hand away. Pt transported back to room and performed maxi move transfer to bed. Pt sat at EOB via maxi move and performed sit to supine transfer maxA x 1. Pt required total A x 2 to scoot to [facility]. Pt left with soft touch bell within reach and needs met.      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); sacral wound Restrictions Weight Bearing Restrictions: No General:   Vital Signs: Therapy Vitals Temp: 98 F (36.7 C) Temp Source:  Oral Pulse Rate: (!) 109 Resp: 20 BP: 130/80 Patient Position (if appropriate): Lying Oxygen Therapy SpO2: 100 % O2 Device: Room Air Pain: Pain Assessment Pain Scale: Faces Faces Pain Scale: Hurts little more Pain Location: (unable to state) Pain Descriptors / Indicators: Grimacing    Therapy/Group: Individual Therapy  David [name]  Mansel Strother, PTA  03/18/2019, 4:31 PM

## 2019-03-18 NOTE — Patient Care Conference (Signed)
Inpatient RehabilitationTeam Conference and Plan of Care Update Date: 03/18/2019   Time: 10:45 AM    Patient Name: David [name]      Medical Record Number: [id number]  Date of Birth: 1932-09-29 Sex: Male         Room/Bed: 4W12C/4W12C-01 Payor Info: Payor: [place] / Plan: BCBS MEDICARE / Product Type: *No Product type* /    Admitting Diagnosis: cva  Admit Date/Time:  03/03/2019  6:02 PM Admission Comments: No comment available   Primary Diagnosis:  Hemiparesis affecting right side as late effect of stroke (HCC) Principal Problem: Hemiparesis affecting right side as late effect of stroke [facility])  Patient Active Problem List   Diagnosis Date Noted  . Hemiparesis affecting right side as late effect of stroke ([place]) 03/24/2019  . Cerebrovascular accident (CVA) of left thalamus ([place]) 02/28/2019  . CHF (congestive heart failure) ([place])   . History of stroke   . Acute ischemic stroke ([place]) 03/05/2019  . Hematuria 12/21/2018  . Vertebral fracture, osteoporotic ([place]) 08/29/2018  . Advance care planning 03/19/2018  . Hemianopia, homonymous, right   . Acute embolic stroke ([place]) multifocal d/t AF s/p tPA. 12/09/2017  . Loss of peripheral visual field 12/09/2017  . Urinary retention   . Healthcare maintenance 01/01/2017  . Lower urinary tract symptoms (LUTS) 01/01/2017  . Tear of biceps muscle 07/17/2016  . Medicare annual wellness visit, initial 10/31/2012  . Malignant neoplasm of larynx ([place]) 09/22/2007  . Diabetes mellitus ([place]) 02/13/2007  . HLD (hyperlipidemia) 02/13/2007  . Essential hypertension 02/13/2007  . Atrial fibrillation with RVR ([place]) 02/13/2007    Expected Discharge Date: Expected Discharge Date: 04/08/19  Team Members Present: Physician leading conference: Dr. Alysia Penna Social Worker Present: Ovidio Kin, LCSW Nurse Present: Ellison Carwin, LPN PT Present: Leavy Cella, PT;Rosita Dechalus, PTA OT Present: Darleen Crocker, OT SLP Present:  Other (comment)(Celia Lofall Carr-SPT) [place] Coordinator present : Gunnar Fusi     Current Status/Progress Goal Weekly Team Focus  Medical   Incont of bowel and bladder, impulsive with eating     improve continenece   Bowel/Bladder   Pt is incontinent B/B. LBM 03/17/2019.  Encourage timed toileting. Maintain regular bowel pattern.  Assist with toileting needs PRN.   Swallow/Nutrition/ Hydration             ADL's   total +2 overall for sit <>stand, functional transfers, and self care. Pt is on night bath  supervision - grooming, max A toileting, and mod A overall  attention, cognition, endurance, strength, ADL retraining, balance,    Mobility   maxA x 2 bed mobility, fair static sitting balance, max-total A x 2 STS from EOB  mod assist w/c level  transfers, cognitive remediation, endurance    Communication   MaxAfor use of intelligibility strategies and verbalization of wants and needs  supervision for intelligibility, minA for verbalization of wants/needs  use of strategies during structured tasks, encourage verbalization of wants and needs using electrolarynx   Safety/Cognition/ Behavioral Observations  MaxA for problem solving, attention and awareness  ModA for memory, MinA for problem solving and selective attention, intellectual awareness  Continue functional problem solving, memory tasks and attention to task.   Pain   No signs of pain via Faces scale.  Remain pain free.  Assess pain Q shift and PRN.   Skin   Pt has ecchymosis to BUE and BLE, skin tears to BUE, a wound to the sacrum, and MASD to the groin/peri  area.  Treat skin issues per orders to promote healing.  Assess skin Q shift and PRN.      *See Care Plan and progress notes for long and short-term goals.     Barriers to Discharge  Current Status/Progress Possible Resolutions Date Resolved   Physician    Medical stability;Other (comments);Trach     Slow progress, acute on chronic deficits  Cont rehab,  establish baseline      Nursing                  PT                    OT                  SLP                SW                Discharge Planning/Teaching Needs:  Wife wants to take pt home with hired care, unsure if she realizes how much care pt will be at discharge. Will see if he progresses while here      Team Discussion:  Goals mod assist wheelchair level-currently plus 2 total assist. Incontinent of bowel and bladder, nursing is working on this Foley DC and checking PVR's. Fatigues easily and needs lots of cues. Improved aggiatation and more alert the past two days. DC mitts but pt picking at his skin and making it bleed. Speech to re-eval swallowing due to holding food in his mouth now.   Revisions to Treatment Plan:  DC 5/13    Continued Need for Acute Rehabilitation Level of Care: The patient requires daily medical management by a physician with specialized training in physical medicine and rehabilitation for the following conditions: Daily direction of a multidisciplinary physical rehabilitation program to ensure safe treatment while eliciting the highest outcome that is of practical value to the patient.: Yes Daily medical management of patient stability for increased activity during participation in an intensive rehabilitation regime.: Yes Daily analysis of laboratory values and/or radiology reports with any subsequent need for medication adjustment of medical intervention for : Neurological problems;Wound care problems;Mood/behavior problems   I attest that I was present, lead the team conference, and concur with the assessment and plan of the team. Teleconference held due to COVID-19   Elease Hashimoto 03/18/2019, 3:14 PM

## 2019-03-19 ENCOUNTER — Inpatient Hospital Stay (HOSPITAL_COMMUNITY): Payer: Medicare Other | Admitting: Speech Pathology

## 2019-03-19 ENCOUNTER — Telehealth: Payer: Self-pay | Admitting: Family Medicine

## 2019-03-19 ENCOUNTER — Inpatient Hospital Stay (HOSPITAL_COMMUNITY): Payer: Medicare Other | Admitting: Physical Therapy

## 2019-03-19 ENCOUNTER — Inpatient Hospital Stay (HOSPITAL_COMMUNITY): Payer: Medicare Other | Admitting: Occupational Therapy

## 2019-03-19 LAB — GLUCOSE, CAPILLARY
Glucose-Capillary: 109 mg/dL — ABNORMAL HIGH (ref 70–99)
Glucose-Capillary: 114 mg/dL — ABNORMAL HIGH (ref 70–99)
Glucose-Capillary: 113 mg/dL — ABNORMAL HIGH (ref 70–99)
Glucose-Capillary: 126 mg/dL — ABNORMAL HIGH (ref 70–99)

## 2019-03-19 NOTE — Telephone Encounter (Signed)
Please let her know he had a total laryngectomy with left radical neck dissection.  Thanks.

## 2019-03-19 NOTE — Progress Notes (Signed)
Occupational Therapy Session Note  Patient Details  Name: David [name] MRN: [id number] Date of Birth: Jan 23, 1932  Today's Date: 03/19/2019 OT Individual Time: 1015-1100 OT Individual Time Calculation (min): 45 min  and Today's Date: 03/19/2019 OT Missed Time: 30 Minutes Missed Time Reason: Patient fatigue   Short Term Goals: Week 1:  OT Short Term Goal 1 (Week 1): Pt will be able to transfer with assist of 1 person. OT Short Term Goal 2 (Week 1): Pt will maintain dynamic sititng balance on EOB for 3 minutes or more with min A for self care tasks. OT Short Term Goal 3 (Week 1): Pt will perform UB dressing with max A consistently to decrease assistance with self care tasks.  Skilled Therapeutic Interventions/Progress Updates:    Upon entering the room, pt supine in bed. Pt sleeping initially and therapist sitting on floor in order to not startle pt awake. Pt opening eyes and begins swinging R UE wildly. OT holding onto R hand and trying to speak calmly to pt and reorient him. Pt shaking head "no" when asked about therapy and plans to exit the bed. OT noticed increase in skin tears on UEs likely due to pt hitting sides of bed. OT discussed with team various options and placed elbow protectors on pt this session. Wife will be called in order to request long sleeve shirts and pants to increase protection as well. OT exiting the room as SLP enters to trial liquids as pt is now alert.  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); sacral wound Restrictions Weight Bearing Restrictions: No General: General OT Amount of Missed Time: 30 Minutes Vital Signs: Therapy Vitals Pulse Rate: 82 BP: 132/80 Oxygen Therapy SpO2: 98 % O2 Device: Room Air Pain: Pain Assessment Pain  Scale: Faces Pain Score: 0-No pain Faces Pain Scale: No hurt ADL: ADL Eating: Moderate assistance Where Assessed-Eating: Chair Grooming: Dependent Where Assessed-Grooming: Bed level Upper Body Bathing: Not assessed Lower Body Bathing: Not assessed Upper Body Dressing: Not assessed Lower Body Dressing: Not assessed Toileting: Not assessed Toilet Transfer: Not assessed Tub/Shower Transfer: Not assessed   Therapy/Group: Individual Therapy  Gypsy Decant 03/19/2019, 11:35 AM

## 2019-03-19 NOTE — Progress Notes (Signed)
Physical Therapy Session Note  Patient Details  Name: David [name] MRN: [id number] Date of Birth: 1932/01/17  Today's Date: 03/19/2019 PT Individual Time: 1535-1630 PT Individual Time Calculation (min): 55 min   Short Term Goals: Week 1:  PT Short Term Goal 1 (Week 1): Pt will be able to tolerate OOB x 2 hours for increased overall activity tolerance PT Short Term Goal 2 (Week 1): Pt will be able to perform functional transfer with max assist +2 PT Short Term Goal 3 (Week 1): Pt will be able to demonstrate sitting balance during functional task with mod assist  Skilled Therapeutic Interventions/Progress Updates:   Pt received supine in bed and agreeable to PT via vocal stimulator. Supine>sit transfer with min assist and moderate cues sequencing and attention to task. Sitting EOB with min-supervision assist from PT with constant mild R laterla lean but no increased LOB. Assist for reciprocal scooting to EOB from PT. Sit<>stand from EOB at elevated height using 3 musketeer x 2. Stand pivot with 3 musketeer max assist + 2 to WC. Pt noted ot have mild pushing tendencies to the L. Transported to rehab gym in [facility]. Sit<>stand x 3 with 3 musketeer as listed above with standing balance/tolarance up to 1 minutes. Pt reports initial dizziness that improved with time. Weight shifting on first 2 bouts with visual feedback from mirror, bot noted to have difficulty attending to mirror. Transported to day room. kinetron reciprocal movement and sustained attention task  3 x  47mn with min-mod assist to maintain reciprocal pattern and full ROM. Pt returned to room and performed squat pivot transfer to bed with total A transfers +2 to bed on the L. Sit>supine completed with max assist +2 for trunk and BLE management, and left supine in bed with call bell in reach and all needs met.       Therapy Documentation Precautions:  Precautions Precautions: Fall, Other (comment) Precaution Comments: R inattention; R  hemianopsia from old CVA; uses vocal stimulator for communication; trach collar for supplemental O2 over stoma site (h/o laryngectomy due to CA); sacral wound Restrictions Weight Bearing Restrictions: No    Vital Signs: Therapy Vitals Temp: 97.7 F (36.5 C) Temp Source: Oral Pulse Rate: 62 Resp: 17 BP: (!) 134/95 Patient Position (if appropriate): Lying Oxygen Therapy SpO2: 98 % O2 Device: Room Air Pain:   faces: hurts a little.   Therapy/Group: Individual Therapy  ALorie Phenix4/23/2020, 6:02 PM

## 2019-03-19 NOTE — Progress Notes (Signed)
[facility] PHYSICAL MEDICINE & REHABILITATION PROGRESS NOTE   Subjective/Complaints:  No issues per RN, alert  Turning toward R in bed, has mitts on , no resp distress   ROS: Limited due to cognitive/behavioral    Objective:   No results found. No results for input(s): WBC, HGB, HCT, PLT in the last 72 hours. Recent Labs    03/18/19 1320  NA 148*  K 4.7  CL 111  CO2 22  GLUCOSE 139*  BUN 27*  CREATININE 1.04  CALCIUM 9.7    Intake/Output Summary (Last 24 hours) at 03/19/2019 0826 Last data filed at 03/19/2019 0735 Gross per 24 hour  Intake 370 ml  Output 1000 ml  Net -630 ml     Physical Exam: Vital Signs Blood pressure 140/77, pulse 79, temperature 98 F (36.7 C), temperature source Oral, resp. rate 16, SpO2 99 %.  Constitutional: No distress . Vital signs reviewed. HEENT: EOMI, oral membranes moist Neck: supple Cardiovascular: RRR without murmur. No JVD    Respiratory: CTA Bilaterally without wheezes or rales. Normal effort    GI: BS +, non-tender, non-distended  Extremities: No clubbing, cyanosis, or edema Skin: No evidence of breakdown, no evidence of rash Neurologic: Cranial nerves II through XII intact, motor strength is 4/5 in Left and 3- RIght deltoid, bicep, tricep, grip,2- Right and 4/5 left  hip flexor, knee extensors, ankle dorsiflexor and plantar flexor- Unchanged 4/23 Sensory exam cannot assess due to cognition/aphasia   Cerebellar exam cannot assess due to cognition as well as strength on Right side Musculoskeletal: . No joint swelling    Assessment/Plan: 1. Functional deficits secondary to Left PCA and PICA infarcts  which require 3+ hours per day of interdisciplinary therapy in a comprehensive inpatient rehab setting.  Physiatrist is providing close team supervision and 24 hour management of active medical problems listed below.  Physiatrist and rehab team continue to assess barriers to discharge/monitor patient progress toward functional  and medical goals  Care Tool:  Bathing        Body parts bathed by helper: Right arm, Left upper leg, Left arm, Right lower leg, Chest, Abdomen, Left lower leg, Face, Front perineal area, Buttocks, Right upper leg Body parts n/a: Abdomen   Bathing assist Assist Level: 2 Helpers     Upper Body Dressing/Undressing Upper body dressing   What is the patient wearing?: Pull over shirt    Upper body assist Assist Level: Maximal Assistance - Patient 25 - 49%    Lower Body Dressing/Undressing Lower body dressing      What is the patient wearing?: Incontinence brief, Pants     Lower body assist Assist for lower body dressing: 2 Helpers     Toileting Toileting    Toileting assist Assist for toileting: 2 Helpers     Transfers Chair/bed transfer  Transfers assist     Chair/bed transfer assist level: 2 Helpers     Locomotion Ambulation   Ambulation assist   Ambulation activity did not occur: Safety/medical concerns          Walk 10 feet activity   Assist  Walk 10 feet activity did not occur: Safety/medical concerns        Walk 50 feet activity   Assist Walk 50 feet with 2 turns activity did not occur: Safety/medical concerns         Walk 150 feet activity   Assist Walk 150 feet activity did not occur: Safety/medical concerns         Walk  10 feet on uneven surface  activity   Assist Walk 10 feet on uneven surfaces activity did not occur: Safety/medical concerns         Wheelchair     Assist Will patient use wheelchair at discharge?: Yes Type of Wheelchair: Manual Wheelchair activity did not occur: Safety/medical concerns(use of TIS due to positioning)  Wheelchair assist level: Dependent - Patient 0%(TIS)      Wheelchair 50 feet with 2 turns activity    Assist        Assist Level: Dependent - Patient 0%   Wheelchair 150 feet activity     Assist     Assist Level: Dependent - Patient 0%    Medical Problem  List and Plan: 1.Decreased functional mobility, right homonymous hemianopsia as well as cognitive impairmentssecondary to bilateral posterior circulation infarctions as well as history of CVA-  --Continue CIR therapies including PT, OT, and SLP  Fluctuating level of alertness, related to 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 as well as multi infarct dementia 2. Antithrombotics: -DVT/anticoagulation:Eliquis- monitor for anemia -antiplatelet therapy: N/A 3. Pain Management:Tylenol as needed 4. Mood:Provide emotional support -antipsychotic agents: Seroquel 25 mg daily at bedtime 5. Neuropsych: This patientis notcapable of making decisions on hisown behalf. 6. Skin/Wound Care:Routine skin checks 7. Fluids/Electrolytes/Nutrition:Routine in and out's with follow-up chemistries, elevated BUN enc fluid  -intake I 6105ml  8. History of atrial fibrillation. Cardiac rate controlled. Continue Eliquis 9. Hematuria/UTI. Followed by urology services Dr.Dahlstedt. Continue Flomax 0.4 mg twice daily. Complete course of Rocephin for UTI prior to voiding trial 10. Hypertension. Lopressor 12.5 mg twice a day. Monitor with increased mobility Vitals:   03/18/19 2028 03/19/19 0324  BP:  140/77  Pulse: 91 79  Resp: 19 16  Temp:  98 F (36.7 C)  SpO2: 97% 99%  controlled 4/23 11. Type 2 diabetes mellitus. Sliding scale insulin. Check blood sugars before meals and at bedtime. Patient on Glucophage 500 mg twice daily prior to admission. Resume as needed CBG (last 3)  Recent Labs    03/18/19 1657 03/18/19 2058 03/19/19 0630  GLUCAP 123* 124* 109*   -good control 4/23 12. Hyperlipidemia. Zocor 13. History of laryngeal cancer. Status post laryngectomy, no trach tube large stoma, no need for continuous pulse ox.Difficult to occlude trach with exception of gloved finger Monitor for any increased secretions  14.  Mild HyperNatremia- may be fluid related  will monitor  LOS: 7 days A FACE TO FACE EVALUATION WAS PERFORMED  Charlett Blake 03/19/2019, 8:26 AM

## 2019-03-19 NOTE — Progress Notes (Signed)
Social Work Patient ID: David [name], male   DOB: Jun 01, 1932, 85 y.o.   MRN: [id number] [place] with wife via telephone yesterday after team meeting, to discuss goals mod assist wheelchair level and target discharge date of 5/13. Discussed how much care pt will require at discharge, 24 hr physical care. Wife is not sure if she can hired 24 hr care and concerned he is not really making progress since all of the times before he had done well and was mobile and walking prior to admission this time. He has always been one who was very active and this is the reason wife feels he is picking himself due to frustration over his deficits and lack of movement. Due to pt's insurance Blue Medicare it is not likely they will cover NHP due to coming to CIR. Wife does not want to pursue this yet but await progress in therapies.

## 2019-03-19 NOTE — Telephone Encounter (Signed)
They are aware of the type of laryngectomy and stated they are very appreciative of everything you are doing!

## 2019-03-19 NOTE — Evaluation (Signed)
Speech Language Pathology Assessment and Plan  Patient Details  Name: David [name] MRN: [id number] Date of Birth: [birth year]   BEDSIDE SWALLOW EVALUATION   SLP Diagnosis: Speech and Language deficits;Cognitive Impairments;Dysphagia  Rehab Potential: Poor ELOS: 04/08/19    Today's Date: 03/19/2019 SLP Individual Time: 7939-0300 SLP Individual Time Calculation (min): 60 min   Problem List:  Patient Active Problem List   Diagnosis Date Noted  . Hemiparesis affecting right side as late effect of stroke ([place]) 02/25/2019  . Cerebrovascular accident (CVA) of left thalamus ([place]) 03/11/2019  . CHF (congestive heart failure) ([place])   . History of stroke   . Acute ischemic stroke ([place]) 03/05/2019  . Hematuria 12/21/2018  . Vertebral fracture, osteoporotic ([place]) 08/29/2018  . Advance care planning 03/19/2018  . Hemianopia, homonymous, right   . Acute embolic stroke ([place]) multifocal d/t AF s/p tPA. 12/09/2017  . Loss of peripheral visual field 12/09/2017  . Urinary retention   . Healthcare maintenance 01/01/2017  . Lower urinary tract symptoms (LUTS) 01/01/2017  . Tear of biceps muscle 07/17/2016  . Medicare annual wellness visit, initial 10/31/2012  . Malignant neoplasm of larynx ([place]) 09/22/2007  . Diabetes mellitus ([place]) 02/13/2007  . HLD (hyperlipidemia) 02/13/2007  . Essential hypertension 02/13/2007  . Atrial fibrillation with RVR ([place]) 02/13/2007   Past Medical History:  Past Medical History:  Diagnosis Date  . Cancer of neck ([place])   . History of atrial fibrillation   . Hyperkalemia    with Ramipril dose at 10 mg, K wnl at 5 mg. per day  . Hyperlipidemia   . Hypertension    Pulmonary  . Larynx cancer ([place])   . Right homonymous hemianopsia    [name] 12/09/2017  . Stroke ([place]) 11/2017   peripheral vision loss both eyes/notes 12/09/2017  . Type II diabetes mellitus ([place])   . Urinary retention   . Vertebral fracture, osteoporotic [facility])    Past Surgical History:  Past  Surgical History:  Procedure Laterality Date  . AMPUTATION FINGER / THUMB Right    distal amputation x4 fingers (not thumb)   . CATARACT EXTRACTION W/ INTRAOCULAR LENS  IMPLANT, BILATERAL Bilateral 2007  . DOPPLER ECHOCARDIOGRAPHY     Atrial fib EF 60%--01/18/1994, 12/02/02--atrial fib, EF 50-55  . DOPPLER ECHOCARDIOGRAPHY     ra,la,lv,mild dilat.,trace mitral regurg., mild pulm. htn  . PILONIDAL CYST EXCISION    . RADICAL NECK DISSECTION Left ~ 1993  . TOTAL LARYNGECTOMY  ~1993   with left radical neck dissection    Assessment / Plan / Recommendation Clinical Impression BSE performed d/t report of increased nasal regurgitation with PO intake (both liquids and solids) as well as report of oral residue after meals (discovered after significant time lapse). Pt has a total laryngectomy (spoke with pt's MD Dr. Damita Dunnings to confirm exact nature of laryngectomy - total with left radical dissection). ALSO spoke to pt's wife and pt has not demonstrate any s/s of aspiriation or dysphagia prior to reports during this admission. Pt continues to present with severe cognitive impairments and is unable to follow directions this date. Pt's dentures in place. Trails of thin water via straw yielded good lip seal and enough pressure to suck thin liquids thru straw. However, pt with facial grimacing and audible nasal turbulance heard with consumption. No visible regurtition observed but nasal turbulance heard with consumption of 4 sips. Pt with facial grimacing throughout and refused to consume more trials.  Audible nasal turbulance indicative of velopharyngeal dysfunction and acute dysphagia.  No concern for aspiration given total laryngectomy. Later in day, SLP facilitated further trials of applesauce with good oral clearing and no audible velopharyngeal noise. Trials of thin water (cold and room temperature) administered with audible velopharyngeal noise and refusal of trials. Nasal turbulance decreased slightly with  consumption of nectar but deminished completely when consuming honey thick liquids via spoon. Recommend downgrading pt's diet to dysphagia 1 with honey thick liquids via spoon, medicine crushed and full supervision. No nasal regurgitation observed. Education provided to wife, nursing and MD.      Skilled Therapeutic Interventions          Pt with continues to appear very confused, rubbing arms on bed rails, bilateral mittens in place and pt's arms were bleeding in several places. Despite Max A pt is very distracted, difficult to redirect and agitated.    SLP Assessment  Patient will need skilled Speech Lanaguage Pathology Services during CIR admission    Recommendations  SLP Diet Recommendations: Dysphagia 1 (Puree);Honey Liquid Administration via: Spoon Medication Administration: Crushed with puree Supervision: Staff to assist with self feeding;Full supervision/cueing for compensatory strategies Compensations: Minimize environmental distractions;Slow rate;Small sips/bites Postural Changes and/or Swallow Maneuvers: Seated upright 90 degrees Oral Care Recommendations: Oral care QID Recommendations for Other Services: Neuropsych consult Patient destination: [place] (SNF) Follow up Recommendations: Skilled Nursing facility Equipment Recommended: To be determined    SLP Frequency 3 to 5 out of 7 days   SLP Duration  SLP Intensity  SLP Treatment/Interventions 04/08/19  Minumum of 1-2 x/day, 30 to 90 minutes  Cognitive remediation/compensation;Environmental controls;Internal/external aids;Speech/Language facilitation;Therapeutic Activities;Patient/family education;Functional tasks;Cueing hierarchy       Short Term Goals:  Week 1: SLP Short Term Goal 1 (Week 1): Patient will maximize speech intelligibility to 50% at the phrase level with Mod A multimodal cues for use of strategies and placement of electrolarynx SLP Short Term Goal 2 (Week 1): Patient will verbalize  wants/needs at the phrase level with Mod A verbal cues for word-finding.  SLP Short Term Goal 3 (Week 1): Patient will demonstrate sustained attention to functional tasks for 5 minutes with Mod A verbal cues.  SLP Short Term Goal 4 (Week 1): Patient will demonstrate orientation to place, time, and situation with Mod A multimodal cues.  SLP Short Term Goal 5 (Week 1): Patient will attend to right field of enviornment during functional tasks with Mod A multimodal cues.  SLP Short Term Goal 6 (Week 1): Patient will consume current diet without s/s of dysphagia.   Refer to Care Plan for Long Term Goals  Recommendations for other services: Neuropsych  Discharge Criteria: Patient will be discharged from SLP if patient refuses treatment 3 consecutive times without medical reason, if treatment goals not met, if there is a change in medical status, if patient makes no progress towards goals or if patient is discharged from hospital.  The above assessment, treatment plan, treatment alternatives and goals were discussed and mutually agreed upon: by family  Aleia Larocca 03/19/2019, 3:50 PM

## 2019-03-19 NOTE — Telephone Encounter (Signed)
Best number [phone]  [facility] speech therapy Called wanting to know what type of laryngectomy pt had.  She wants to call and talk to pt spouse sometime today

## 2019-03-20 ENCOUNTER — Inpatient Hospital Stay (HOSPITAL_COMMUNITY): Payer: Medicare Other | Admitting: Speech Pathology

## 2019-03-20 ENCOUNTER — Inpatient Hospital Stay (HOSPITAL_COMMUNITY): Payer: Medicare Other | Admitting: Physical Therapy

## 2019-03-20 ENCOUNTER — Inpatient Hospital Stay (HOSPITAL_COMMUNITY): Payer: Medicare Other | Admitting: Occupational Therapy

## 2019-03-20 LAB — GLUCOSE, CAPILLARY
Glucose-Capillary: 105 mg/dL — ABNORMAL HIGH (ref 70–99)
Glucose-Capillary: 105 mg/dL — ABNORMAL HIGH (ref 70–99)
Glucose-Capillary: 114 mg/dL — ABNORMAL HIGH (ref 70–99)
Glucose-Capillary: 164 mg/dL — ABNORMAL HIGH (ref 70–99)
Glucose-Capillary: 167 mg/dL — ABNORMAL HIGH (ref 70–99)

## 2019-03-20 LAB — CULTURE, BLOOD (ROUTINE X 2)
Culture: NO GROWTH
Culture: NO GROWTH
Special Requests: ADEQUATE
Special Requests: ADEQUATE

## 2019-03-20 MED ORDER — PRO-STAT SUGAR FREE PO LIQD
30.0000 mL | Freq: Three times a day (TID) | ORAL | Status: DC
  Administered 2019-03-20 – 2019-03-24 (×14): 30 mL
  Filled 2019-03-20 (×14): qty 30

## 2019-03-20 MED ORDER — JEVITY 1.2 CAL PO LIQD
1000.0000 mL | ORAL | Status: DC
  Filled 2019-03-20: qty 1000

## 2019-03-20 MED ORDER — OSMOLITE 1.5 CAL PO LIQD
237.0000 mL | Freq: Every day | ORAL | Status: DC
  Administered 2019-03-20 – 2019-03-24 (×21): 237 mL
  Filled 2019-03-20 (×29): qty 237

## 2019-03-20 NOTE — Progress Notes (Signed)
Occupational Therapy Session Note  Patient Details  Name: David [name] MRN: [id number] Date of Birth: [birth year]  Today's Date: 03/20/2019 OT Individual Time: 7616-0737 and 1062-6948 OT Individual Time Calculation (min): 54 min and 66 min  Short Term Goals: Week 1:  OT Short Term Goal 1 (Week 1): Pt will be able to transfer with assist of 1 person. OT Short Term Goal 2 (Week 1): Pt will maintain dynamic sititng balance on EOB for 3 minutes or more with min A for self care tasks. OT Short Term Goal 3 (Week 1): Pt will perform UB dressing with max A consistently to decrease assistance with self care tasks.  Skilled Therapeutic Interventions/Progress Updates:    Pt greeted in bed, increased time required for increasing alertness. Alertness noticeably improved once OT put his glasses on. Pt shrugged when asked if he wanted to try to use the bathroom. 2 assist for supine<sit with bed deflated. He was able to maintain static sitting balance with steady assist, exhibiting posterior bias and Rt lean with no overt LOB. Max A of 2 for sit<stand into [place] (with bed elevated), and for toilet transfer. Post transfer 02 sats on RA 97%. Provided him time to void while seated in [place]. 3 assist for sit<stand (from elevated toilet). Pt standing in [place] for 30 second intervals for perihygiene completion with vcs for upright alignment. Max A of 2 for sit<stand from Stedy paddles. Pt with continuous BM while hygiene was being completed, and therefore this required increased time. Once brief was donned, he was transferred via Stedy back to bed with 2 assist. Max A for hand hygiene. At this time, pt smiling and laughing appropriately during conversation. 02 sats on RA 92%. Per RN, ok to keep hand mitts removed. He was left with all bedrails up, pillows placed between pt and rails for safety, bed alarm set, and soft call bell in lap. Tx focus on awareness, following 1 step instruction, sit<stands, and standing balance  during functional tasks.    2nd Session 1:1 tx (66 min) Pt greeted in bed after NG tube placement. Appearing alert and smiling at time of arrival. Mod A for supine<sit with improved ability to follow 1 step instruction when compared to earlier session. Min guard-supervision for sitting balance while he applied deodorant. Pt used R UE to apply deodorant to Lt underarm! HOH for initial motor planning due to perseveration on pulling mattress and blankets. R UE used as gross stabilizer while he twisted the cap back on with Mod A. Afterwards, continued working on praxis during dressing tasks. He initiated threading R UE into shirt, and able to pull button up shirt around back with supervision to thread his other UE. Pt needed help with buttons. When handed pants, pt attempted to don it as a shirt. OT threaded LEs into pants EOB, and when he returned to supine, pt pulled them up a little past thighs. Utilized bridges (with 2 assist to stabilize feet) and also rolls Rt>Lt with Mod-Max A to fully elevate them over hips. Prior to elevating pants, +2 assist required for perihygiene and donning clean brief. Supine<sit completed with Mod A once again. Stedy transfer completed with +2 assist from elevated bed. When standing in device, pt shifted both legs towards Rt side, and trunk towards Lt. 2 assist and manual facilitation required for improved placement of knees/safety. He then transferred to w/c. Pt was taken to RN station with B hand mitts donned, safety belt fastened, and w/c reclined. Tx focus placed  on ADL retraining, sitting balance, Rt NMR, and sit<stands.   02 sats during session 96-97% on RA.   Throughout session, pt required cuing and assist to not pull on NG tube. RN made aware.  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; uses vocal stimulator for communication; trach collar for supplemental O2 over  stoma site (h/o laryngectomy due to CA); sacral wound Restrictions Weight Bearing Restrictions: No Vital Signs: Therapy Vitals Pulse Rate: 73 Resp: 16 Patient Position (if appropriate): Lying Oxygen Therapy SpO2: 92 % O2 Device: Room Air Pain: No s/s pain during sessions.  Pain Assessment Pain Scale: (pt with no attempt to indicate) Faces Pain Scale: No hurt ADL: ADL Eating: Moderate assistance Where Assessed-Eating: Chair Grooming: Dependent Where Assessed-Grooming: Bed level Upper Body Bathing: Not assessed Lower Body Bathing: Not assessed Upper Body Dressing: Not assessed Lower Body Dressing: Not assessed Toileting: Not assessed Toilet Transfer: Not assessed Tub/Shower Transfer: Not assessed     Therapy/Group: Individual Therapy  Timiko Offutt A Demir T[place]worth 03/20/2019, 12:05 PM

## 2019-03-20 NOTE — Progress Notes (Signed)
Physical Therapy Session Note  Patient Details  Name: David [name] MRN: [id number] Date of Birth: 1932/10/18  Today's Date: 03/20/2019 PT Individual Time: 1415-1500 PT Individual Time Calculation (min): 45 min   Short Term Goals: Week 1:  PT Short Term Goal 1 (Week 1): Pt will be able to tolerate OOB x 2 hours for increased overall activity tolerance PT Short Term Goal 2 (Week 1): Pt will be able to perform functional transfer with max assist +2 PT Short Term Goal 3 (Week 1): Pt will be able to demonstrate sitting balance during functional task with mod assist  Skilled Therapeutic Interventions/Progress Updates:    session focused on sitting balance and attention to task with following 1 and 2 step commands. Pt performs horseshoe toss initially with max cuing, progressing to min cuing with reaching and tossing task. Pt performs seated ball kick with close supervision for balance, mod tactile cues and facilitation for use of Rt LE.  Sit <> stand with 3 musketeers technique, max A of 2 multiple attempts with pt able to stand < 30 seconds.  Stand pivot transfer with 3 musketeers technique max A x 2.  Pt left in TIS w/c at nurses station with mitts and alarm donned.  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); sacral wound Restrictions Weight Bearing Restrictions: No Pain: Pain Assessment Pain Scale: (pt with no attempt to indicate)    Therapy/Group: Individual Therapy  Ceceilia Cephus 03/20/2019, 3:07 PM

## 2019-03-20 NOTE — Progress Notes (Addendum)
Speech Language Pathology Weekly Progress and Session Note  Patient Details  Name: David [name] MRN: [id number] Date of Birth: 09/16/1932  Beginning of progress report period: March 13, 2019 End of progress report period: March 20, 2019  Today's Date: 03/20/2019 SLP Individual Time: 0700-0745 SLP Individual Time Calculation (min): 45 min  Short Term Goals: Week 1: SLP Short Term Goal 1 (Week 1): Patient will maximize speech intelligibility to 50% at the phrase level with Mod A multimodal cues for use of strategies and placement of electrolarynx SLP Short Term Goal 1 - Progress (Week 1): Not met SLP Short Term Goal 2 (Week 1): Patient will verbalize wants/needs at the phrase level with Mod A verbal cues for word-finding.  SLP Short Term Goal 2 - Progress (Week 1): Not met SLP Short Term Goal 3 (Week 1): Patient will demonstrate sustained attention to functional tasks for 5 minutes with Mod A verbal cues.  SLP Short Term Goal 3 - Progress (Week 1): Not met SLP Short Term Goal 4 (Week 1): Patient will demonstrate orientation to place, time, and situation with Mod A multimodal cues.  SLP Short Term Goal 4 - Progress (Week 1): Not met SLP Short Term Goal 5 (Week 1): Patient will attend to right field of enviornment during functional tasks with Mod A multimodal cues.  SLP Short Term Goal 5 - Progress (Week 1): Not met SLP Short Term Goal 6 (Week 1): Pt will consume current diet without s/s of dysphagia.  SLP Short Term Goal 6 - Progress (Week 1): Not met    New Short Term Goals: Week 2: SLP Short Term Goal 1 (Week 2): Pt will consume trials of ice chips without any visible s/s of dysphagia.  SLP Short Term Goal 2 (Week 2): Pt will complete basic familiar tasks related to ADLs with Max A cues.  SLP Short Term Goal 3 (Week 2): Pt will sustain attention to basic task for ~ 5 minutes with Max A cues.  SLP Short Term Goal 4 (Week 2): Patient will functionally verbalize wants/needs at the phrase  level in 5 out of 10 with Max A verbal cues for word-finding and Max A cues for use of electrolarynx.  SLP Short Term Goal 5 (Week 2): Pt will follow 1 step simple directions in 5 out of 10 opportunties with Max A cues.   Weekly Progress Updates: Pt has made no progress over the last reporting period and as such he has not met any STGs. Pt faces many deficits that are interfering with progress. These include initial lethargy, confusion, inability to redirect to task, poor task tolerance, difficulty placing electrolarynx in appropriate place on neck as well as new velopharyngeal dysfunction requiring NPO status. Pt's confusion and cognitive deficits appear most interfering at this time and new STGs added to target these deficits in hopes that as mentation improves, other abilities with improve. Pt's prognosis for recovery beyond needing Max A during CIR stay is poor. Spoke with MD regarding Palliative Care consult. MD to follow up.      Intensity: Minumum of 1-2 x/day, 30 to 90 minutes Frequency: 3 to 5 out of 7 days Duration/Length of Stay: 04/08/19 Treatment/Interventions: Cognitive remediation/compensation;Environmental controls;Internal/external aids;Speech/Language facilitation;Therapeutic Activities;Patient/family education;Functional tasks;Cueing hierarchy   Daily Session  Skilled Therapeutic Interventions: Skilled treatment session focused on communication, cognition and dysphagia goals. SLP faciltiated session by providing Max A support for repositioning in bed for optimum safety with PO intake. SLP provided skilled observation of pt consuming honey thick  liquids via spoon and puree. Pt with no nasal turbalance present throughout intake. However after ~ 2 oz of honey thick liquids and 3 bites of puree pt with copious amounts of nasal regurgitation as well as posterior oral expectorants (likely from nasal cavity). Pt also pulled on his upper neck area in what appeared to looik liike difficulty  transferring bolus posterioraly. He required frequent oral suctioning with more amounts visible to back of throat and nares. No coughing present (not suspicious for tracheal fistula at bedside). It is also impossible to predict how much is accumulating the nasal passages (i.e., was he continually accumulating PO until his nasal passages couldn't hold any more content). At this time, pt has significant dysphagia related to suspected acute velopharyngeal dysfunction. This places pt at risk for secondary infrections such as sinusitis and increased refusal of intake. Pt is not able to follow any compensatory swallow strategies d/t combination of cognitive deficits as well as speech-language deficits. At this time, recommend NPO. Pt was very hard to redirect to task of eating d/t severe deficits in sustained attention, problem solving, and overall insight in need to eat. Pt attempted to utilitize electrolarynx but became immediately frustrated and threw it down in the bed. Pt attempted to communicate verbally with ~ 25% success with SLP reading pt's lips. Pt very frustrated with SLP's inability to understand. He continues to hit at the bed rails and has multiple bloodied areas to arm and sheets. Extensive time spent with MD discussing pt's current present, decline in velopharyngeal function, recommendation for NPO, pt currently inappropriate for MBS d/t mentation. Recommended consulting wife regarding new order for NPO, MD's recommendation for Cortrac as well as possible need for Palliative Care consult. MD to address these.       General    Pain Pain Assessment Pain Scale: (pt with no attempt to indicate) Faces Pain Scale: No hurt  Therapy/Group: Individual Therapy  Lidwina Kaner Rutherford Nail 03/20/2019, 12:39 PM

## 2019-03-20 NOTE — Procedures (Signed)
Cortrak  Person Inserting Tube:  Everest Hacking, RD Tube Type:  Cortrak - 43 inches Tube Location:  Right nare Initial Placement:  Stomach Secured by: Bridle Technique Used to Measure Tube Placement:  Documented cm marking at nare/ corner of mouth Cortrak Secured At:  70 cm   No x-ray is required. RN may begin using tube.   If the tube becomes dislodged please keep the tube and contact the Cortrak team at [email or web address] (password [email or web address]) for replacement.  If after hours and replacement cannot be delayed, place a NG tube and confirm placement with an abdominal x-ray.    Anique Beckley RD, LDN Clinical Nutrition Pager # - [phone]    

## 2019-03-20 NOTE — Telephone Encounter (Signed)
Noted. Thanks.

## 2019-03-20 NOTE — Progress Notes (Addendum)
[place] PHYSICAL MEDICINE & REHABILITATION PROGRESS NOTE   Subjective/Complaints:   SLP tried D1 honey today but pt refluxed into nasogastric area, discussed extensively with SLP, pt unable to learn compensatory strategies due to receptive language and global cognitive deficits  ROS: Limited due to cognitive/behavioral    Objective:   No results found. No results for input(s): WBC, HGB, HCT, PLT in the last 72 hours. Recent Labs    03/18/19 1320  NA 148*  K 4.7  CL 111  CO2 22  GLUCOSE 139*  BUN 27*  CREATININE 1.04  CALCIUM 9.7    Intake/Output Summary (Last 24 hours) at 03/20/2019 0748 Last data filed at 03/19/2019 2300 Gross per 24 hour  Intake 230 ml  Output 435 ml  Net -205 ml     Physical Exam: Vital Signs Blood pressure (!) 160/96, pulse 69, temperature 98.8 F (37.1 C), resp. rate 17, SpO2 92 %.  Constitutional: No distress . Vital signs reviewed. HEENT: EOMI, oral membranes moist Neck: supple Cardiovascular: RRR without murmur. No JVD    Respiratory: CTA Bilaterally without wheezes or rales. Normal effort    GI: BS +, non-tender, non-distended  Extremities: No clubbing, cyanosis, or edema Skin: No evidence of breakdown, no evidence of rash Neurologic: Cranial nerves II through XII intact, motor strength is 4/5 in Left and 3- RIght deltoid, bicep, tricep, grip,2- Right and 4/5 left  hip flexor, knee extensors, ankle dorsiflexor and plantar flexor- Unchanged 4/23 Sensory exam cannot assess due to cognition/aphasia   Cerebellar exam cannot assess due to cognition as well as strength on Right side Musculoskeletal: . No joint swelling    Assessment/Plan: 1. Functional deficits secondary to Left PCA and PICA infarcts  which require 3+ hours per day of interdisciplinary therapy in a comprehensive inpatient rehab setting.  Physiatrist is providing close team supervision and 24 hour management of active medical problems listed below.  Physiatrist and  rehab team continue to assess barriers to discharge/monitor patient progress toward functional and medical goals  Care Tool:  Bathing        Body parts bathed by helper: Right arm, Left upper leg, Left arm, Right lower leg, Chest, Abdomen, Left lower leg, Face, Front perineal area, Buttocks, Right upper leg Body parts n/a: Abdomen   Bathing assist Assist Level: 2 Helpers     Upper Body Dressing/Undressing Upper body dressing   What is the patient wearing?: Pull over shirt    Upper body assist Assist Level: Maximal Assistance - Patient 25 - 49%    Lower Body Dressing/Undressing Lower body dressing      What is the patient wearing?: Incontinence brief, Pants     Lower body assist Assist for lower body dressing: 2 Helpers     Toileting Toileting    Toileting assist Assist for toileting: 2 Helpers     Transfers Chair/bed transfer  Transfers assist     Chair/bed transfer assist level: 2 Helpers     Locomotion Ambulation   Ambulation assist   Ambulation activity did not occur: Safety/medical concerns          Walk 10 feet activity   Assist  Walk 10 feet activity did not occur: Safety/medical concerns        Walk 50 feet activity   Assist Walk 50 feet with 2 turns activity did not occur: Safety/medical concerns         Walk 150 feet activity   Assist Walk 150 feet activity did not occur: Safety/medical concerns  Walk 10 feet on uneven surface  activity   Assist Walk 10 feet on uneven surfaces activity did not occur: Safety/medical concerns         Wheelchair     Assist Will patient use wheelchair at discharge?: Yes Type of Wheelchair: Manual Wheelchair activity did not occur: Safety/medical concerns(use of TIS due to positioning)  Wheelchair assist level: Dependent - Patient 0%(TIS)      Wheelchair 50 feet with 2 turns activity    Assist        Assist Level: Dependent - Patient 0%   Wheelchair 150  feet activity     Assist     Assist Level: Dependent - Patient 0%    Medical Problem List and Plan: 1.Decreased functional mobility, right homonymous hemianopsia as well as cognitive impairmentssecondary to bilateral posterior circulation infarctions as well as history of CVA-  --Continue CIR therapies including PT, OT, and SLP  Fluctuating level of alertness, related to 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 Antithrombotics: -DVT/anticoagulation:Eliquis- monitor for anemia -antiplatelet therapy: N/A 3. Pain Management:Tylenol as needed 4. Mood:Provide emotional support -antipsychotic agents: Seroquel 25 mg daily at bedtime 5. Neuropsych: This patientis notcapable of making decisions on hisown behalf. 6. Skin/Wound Care:Routine skin checks 7. Fluids/Electrolytes/Nutrition:Routine in and out's with follow-up chemistries, elevated BUN enc fluid  -intake 237ml fluid, ~10% meals   8. History of atrial fibrillation. Cardiac rate controlled. Continue Eliquis 9. Hematuria/UTI. Followed by urology services Dr.Dahlstedt. Continue Flomax 0.4 mg twice daily. Complete course of Rocephin for UTI prior to voiding trial 10. Hypertension. Lopressor 12.5 mg twice a day. Monitor with increased mobility Vitals:   03/20/19 0417 03/20/19 0458  BP:  (!) 160/96  Pulse: 78 69  Resp: 18 17   Temp:  98.8 F (37.1 C)  SpO2: 95% 92%  controlled 4/24 11. Type 2 diabetes mellitus. Sliding scale insulin. Check blood sugars before meals and at bedtime. Patient on Glucophage 500 mg twice daily prior to admission. Resume as needed CBG (last 3)  Recent Labs    03/19/19 1644 03/19/19 2053 03/20/19 0616  GLUCAP 113* 126* 105*   -good control 4/24 12. Hyperlipidemia. Zocor 13. History of laryngeal cancer. Status post laryngectomy, no trach tube large stoma, no need for continuous pulse ox.Difficult to occlude trach with exception of gloved finger Monitor for any increased secretions  14.  Mild HyperNatremia- may be fluid related will monitor 15.  Goals of care- spoke with wife about need fo rCortrak but emphasized that this is a short term solution.  Will need to consider PEG if swallow function does not return.  Discussed that the cognitive effects of the stroke are severe and limit learning compensatory strategies for swallowing.  THerefore prognosis for improvement in swallow fxn are guarded at this time.  Discussed that we may need to consult Palliative care in 1-2 wks to discuss [place], wife understands LOS: 8 days A FACE TO FACE EVALUATION WAS PERFORMED  Luanna Salk Meeah Totino 03/20/2019, 7:48 AM

## 2019-03-20 NOTE — Progress Notes (Signed)
Initial Nutrition Assessment  RD working remotely.  DOCUMENTATION CODES:   Not applicable  INTERVENTION:   Initiate bolus TF regimen via Cortrak NG tube: - Osmolite 1.5 cal formula 5 cans daily (1185 ml total) - Pro-stat 30 ml TID  - Free water per MD due to hypernatremia  Tube feeding regimen provides 2078 kcal, 119 grams of protein, and 903 ml of H2O (100% of needs).  - Please obtain bed weight when able, pt with no weights since 03/05/19  - d/c Ensure Enlive  - d/c Jevity tube feeding orders  NUTRITION DIAGNOSIS:   Inadequate oral intake related to dysphagia as evidenced by NPO status.  GOAL:   Patient will meet greater than or equal to 90% of their needs  MONITOR:   TF tolerance, Labs, Skin, Weight trends, I & O's  REASON FOR ASSESSMENT:   Consult Enteral/tube feeding initiation and management  ASSESSMENT:   85 year old male with PMH of diabetes mellitus, laryngeal cancer s/p laryngectomy, prior right PCA infarction with right homonymous hemianopsia as well as cognitive impairment, atrial fibrillation not on anticoagulation. Pt presented 03/05/19 after being found down on the floor with right-sided weakness and facial droop. CT of the head showed small area of acute infarction. CT angiogram of head and neck showed occlusion of the left posterior cerebral artery. Pt did not receive TPA. MRI of the brain showed multifocal bilateral posterior circulation acute ischemic infarctions. Pt admitted to CIR on 4/16.  Noted pt refluxing DYS1/honey into nasogastric area with pooling in nasopharynx. Pt with receptive aphasia, multi-infarct dementia which limits learning of compensatory strategies for swallowing. Plan is for Cortrak placement and initiation of enteral feeds. MD to discuss PEG with pt's wife.  Pt made NPO this morning and was on DYS1 diet with thin liquids for one day (4/23) and prior to that was on regular textures with thin liquids (4/16-4/23).  Discussed pt and  plan with RN via phone call.  Noted no new weights since admission.  Reviewed RN edema assessment. Pt with mild pitting generalized edema and moderating pitting edema to BLE.  Meal Completion: 0-50% x last 8 recorded meals (averaging 15%)  Medications reviewed and include: SSI, Protonix  Labs reviewed: sodium 148 (H), BUN 27 (H) CBG's: 105, 126, 113, 114 x 24 hours  UOP: 435 ml x 24 hours I/O's: -7.3 L since admit  NUTRITION - FOCUSED PHYSICAL EXAM:  Unable to complete at this time. RD working remotely.  Diet Order:   Diet Order            Diet NPO time specified  Diet effective now              EDUCATION NEEDS:   Not appropriate for education at this time  Skin:  Skin Assessment: Skin Integrity Issues: Other: laceration to sacrum, MASD to bilateral buttocks  Last BM:  03/19/19 small type 6  Height:   Ht Readings from Last 1 Encounters:  03/05/19 6\' 3"  (1.905 m)    Weight:   Wt Readings from Last 1 Encounters:  03/05/19 104 kg    Ideal Body Weight:  89.1 kg  BMI:  28.65 kg/m^2 (calculated using height and weight from 03/05/19)  Estimated Nutritional Needs:   Kcal:  1900-2100  Protein:  100-115 grams  Fluid:  1.9-2.1 L    Gaynell Face, MS, RD, LDN Inpatient Clinical Dietitian Pager: [phone] Weekend/After Hours: [phone]

## 2019-03-21 ENCOUNTER — Encounter (HOSPITAL_COMMUNITY): Payer: Self-pay | Admitting: *Deleted

## 2019-03-21 ENCOUNTER — Other Ambulatory Visit: Payer: Self-pay

## 2019-03-21 ENCOUNTER — Inpatient Hospital Stay (HOSPITAL_COMMUNITY): Payer: Medicare Other | Admitting: Occupational Therapy

## 2019-03-21 LAB — GLUCOSE, CAPILLARY
Glucose-Capillary: 108 mg/dL — ABNORMAL HIGH (ref 70–99)
Glucose-Capillary: 131 mg/dL — ABNORMAL HIGH (ref 70–99)
Glucose-Capillary: 148 mg/dL — ABNORMAL HIGH (ref 70–99)
Glucose-Capillary: 174 mg/dL — ABNORMAL HIGH (ref 70–99)
Glucose-Capillary: 204 mg/dL — ABNORMAL HIGH (ref 70–99)
Glucose-Capillary: 204 mg/dL — ABNORMAL HIGH (ref 70–99)

## 2019-03-21 NOTE — Progress Notes (Signed)
[name] PHYSICAL MEDICINE & REHABILITATION PROGRESS NOTE   Subjective/Complaints:   Patient now n.p.o. on core track feeds, aphasic with cognitive deficits  ROS: Limited due to cognitive/behavioral    Objective:   No results found. No results for input(s): WBC, HGB, HCT, PLT in the last 72 hours. Recent Labs    03/18/19 1320  NA 148*  K 4.7  CL 111  CO2 22  GLUCOSE 139*  BUN 27*  CREATININE 1.04  CALCIUM 9.7    Intake/Output Summary (Last 24 hours) at 03/21/2019 1252 Last data filed at 03/21/2019 0700 Gross per 24 hour  Intake -  Output 1050 ml  Net -1050 ml     Physical Exam: Vital Signs Blood pressure 122/84, pulse 72, temperature 97.6 F (36.4 C), temperature source Oral, resp. rate 16, height 6\' 3"  (1.905 m), weight 124.3 kg, SpO2 93 %.  Constitutional: No distress . Vital signs reviewed. HEENT: EOMI, oral membranes moist Neck: supple Cardiovascular: RRR without murmur. No JVD    Respiratory: CTA Bilaterally without wheezes or rales. Normal effort    GI: BS +, non-tender, non-distended  Extremities: No clubbing, cyanosis, or edema Skin: No evidence of breakdown, no evidence of rash Neurologic: Cranial nerves II through XII intact, motor strength is 4/5 in Left and 3- RIght deltoid, bicep, tricep, grip,2- Right and 4/5 left  hip flexor, knee extensors, ankle dorsiflexor and plantar flexor- Unchanged 4/25 Sensory exam cannot assess due to cognition/aphasia   Cerebellar exam cannot assess due to cognition as well as strength on Right side Musculoskeletal: . No joint swelling    Assessment/Plan: 1. Functional deficits secondary to Left PCA and PICA infarcts  which require 3+ hours per day of interdisciplinary therapy in a comprehensive inpatient rehab setting.  Physiatrist is providing close team supervision and 24 hour management of active medical problems listed below.  Physiatrist and rehab team continue to assess barriers to discharge/monitor patient  progress toward functional and medical goals  Care Tool:  Bathing        Body parts bathed by helper: Right arm, Left upper leg, Left arm, Right lower leg, Chest, Abdomen, Left lower leg, Face, Front perineal area, Buttocks, Right upper leg Body parts n/a: Abdomen   Bathing assist Assist Level: 2 Helpers     Upper Body Dressing/Undressing Upper body dressing   What is the patient wearing?: Button up shirt    Upper body assist Assist Level: Maximal Assistance - Patient 25 - 49%    Lower Body Dressing/Undressing Lower body dressing      What is the patient wearing?: Incontinence brief, Pants     Lower body assist Assist for lower body dressing: 2 Helpers     Toileting Toileting Toileting Activity did not occur Landscape architect and hygiene only): Refused  Toileting assist Assist for toileting: (3 helpers, using Stedy sit<stand from elevated toilet)     Transfers Chair/bed transfer  Transfers assist     Chair/bed transfer assist level: 2 Helpers     Locomotion Ambulation   Ambulation assist   Ambulation activity did not occur: Safety/medical concerns          Walk 10 feet activity   Assist  Walk 10 feet activity did not occur: Safety/medical concerns        Walk 50 feet activity   Assist Walk 50 feet with 2 turns activity did not occur: Safety/medical concerns         Walk 150 feet activity   Assist Walk 150 feet  activity did not occur: Safety/medical concerns         Walk 10 feet on uneven surface  activity   Assist Walk 10 feet on uneven surfaces activity did not occur: Safety/medical concerns         Wheelchair     Assist Will patient use wheelchair at discharge?: Yes Type of Wheelchair: Manual Wheelchair activity did not occur: Safety/medical concerns(use of TIS due to positioning)  Wheelchair assist level: Dependent - Patient 0%(TIS)      Wheelchair 50 feet with 2 turns activity    Assist         Assist Level: Dependent - Patient 0%   Wheelchair 150 feet activity     Assist     Assist Level: Dependent - Patient 0%    Medical Problem List and Plan: 1.Decreased functional mobility, right homonymous hemianopsia as well as cognitive impairmentssecondary to bilateral posterior circulation infarctions as well as history of CVA-  --Continue CIR therapies including PT, OT, and SLP  Fluctuating level of alertness, related to 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 as well as multi infarct dementia Dysphagia with nasal regurg and pooling of nasopharynx- hx laryngectomy as well as extensive CVA  While asp PNA not possible due to trach a severe sinusitis may occur Has acute infarcts Left PCA territory as well as right temporal lobe in addition to old left occipital , cerebellar and deep white matter infarcts Pt with receptive aphasia, Multi infarct dementia which limit learning of compensatory strategies for swllow, will place cortrak, discuss PEG with pt's wife and if this is not pursued consider palliative for [place] 2. Antithrombotics: -DVT/anticoagulation:Eliquis- monitor for anemia -antiplatelet therapy: N/A 3. Pain Management:Tylenol as needed 4. Mood:Provide emotional support -antipsychotic agents: Seroquel 25 mg daily at bedtime 5. Neuropsych: This patientis notcapable of making decisions on hisown behalf. 6. Skin/Wound Care:Routine skin checks 7. Fluids/Electrolytes/Nutrition:Core track feeds, dietary following  8. History of atrial fibrillation. Cardiac rate controlled. Continue Eliquis 9. Hematuria/UTI. Followed by urology services Dr.Dahlstedt. Continue Flomax 0.4 mg twice daily. Complete course of Rocephin for UTI prior to voiding trial 10. Hypertension. Lopressor 12.5 mg twice a day. Monitor with increased mobility Vitals:   03/21/19 0417 03/21/19 0823  BP: 122/84   Pulse: 96 72  Resp: 18 16  Temp: 97.6 F  (36.4 C)   SpO2: 97% 93%  controlled 4/25 11. Type 2 diabetes mellitus. Sliding scale insulin. Check blood sugars before meals and at bedtime. Patient on Glucophage 500 mg twice daily prior to admission. Resume as needed CBG (last 3)  Recent Labs    03/21/19 0406 03/21/19 0803 03/21/19 1203  GLUCAP 131* 204* 108*   -good control 4/25 12. Hyperlipidemia. Zocor 13. History of laryngeal cancer. Status post laryngectomy, no trach tube large stoma, no need for continuous pulse ox.Difficult to occlude trach with exception of gloved finger Monitor for any increased secretions  14.  Mild HyperNatremia- may be fluid related will monitor 15.  Goals of care- spoke with wife about need fo rCortrak but emphasized that this is a short term solution.  Will need to consider PEG if swallow function does not return.  Discussed that the cognitive effects of the stroke are severe and limit learning compensatory strategies for swallowing.  THerefore prognosis for improvement in swallow fxn are guarded at this time.  Discussed that we may need to consult Palliative care in 1-2 wks to discuss [place], wife understands LOS: 9 days A FACE TO FACE EVALUATION WAS PERFORMED  David [name] 03/21/2019, 12:52 PM

## 2019-03-21 NOTE — Progress Notes (Signed)
Occupational Therapy Session Note  Patient Details  Name: David [name] MRN: [id number] Date of Birth: [birth year]  Today's Date: 03/21/2019 OT Individual Time: 0998-3382 OT Individual Time Calculation (min): 44 min   Short Term Goals: Week 1:  OT Short Term Goal 1 (Week 1): Pt will be able to transfer with assist of 1 person. OT Short Term Goal 2 (Week 1): Pt will maintain dynamic sititng balance on EOB for 3 minutes or more with min A for self care tasks. OT Short Term Goal 3 (Week 1): Pt will perform UB dressing with max A consistently to decrease assistance with self care tasks.  Skilled Therapeutic Interventions/Progress Updates:    Pt greeted in bed, asleep, requiring tactile cues to awaken. He shook his head when asked if he wanted to get OOB, put on pants, or engage in other therapeutic tasks. 2 assist for perihygiene and brief change due to incontinent BM in brief. Pt rolled Rt>Lt with Max A and HOH for placing L UE on bedrail, though he was unable to maintain grasp. Pt boosted up in bed with 2 assist. With [facility] elevated, pt engaged in face washing, hand washing, and combing hair to work on following 1 step instruction and motor planning. Pt required HOH to initiate tasks and Max A overall due to lethargy and cognition. At end of session pt was left in care of RN.   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); sacral wound Restrictions Weight Bearing Restrictions: No Vital Signs: Therapy Vitals Temp: (!) 97.5 F (36.4 C) Temp Source: Axillary Pulse Rate: 89 Resp: 16 BP: 120/74 Patient Position (if appropriate): Lying Oxygen Therapy SpO2: 98 % O2 Device: Room Air Pain: No s/s pain during tx    ADL: ADL Eating: Moderate assistance Where Assessed-Eating:  Chair Grooming: Dependent Where Assessed-Grooming: Bed level Upper Body Bathing: Not assessed Lower Body Bathing: Not assessed Upper Body Dressing: Not assessed Lower Body Dressing: Not assessed Toileting: Not assessed Toilet Transfer: Not assessed Tub/Shower Transfer: Not assessed     Therapy/Group: Individual Therapy  Tayli Buch A Kae Lauman 03/21/2019, 3:55 PM

## 2019-03-22 LAB — GLUCOSE, CAPILLARY
Glucose-Capillary: 154 mg/dL — ABNORMAL HIGH (ref 70–99)
Glucose-Capillary: 179 mg/dL — ABNORMAL HIGH (ref 70–99)
Glucose-Capillary: 193 mg/dL — ABNORMAL HIGH (ref 70–99)
Glucose-Capillary: 220 mg/dL — ABNORMAL HIGH (ref 70–99)
Glucose-Capillary: 84 mg/dL (ref 70–99)
Glucose-Capillary: 98 mg/dL (ref 70–99)

## 2019-03-22 MED ORDER — CHLORHEXIDINE GLUCONATE 0.12 % MT SOLN
15.0000 mL | Freq: Two times a day (BID) | OROMUCOSAL | Status: DC
  Administered 2019-03-22 – 2019-03-24 (×6): 15 mL via OROMUCOSAL
  Filled 2019-03-22 (×6): qty 15

## 2019-03-22 MED ORDER — ORAL CARE MOUTH RINSE
15.0000 mL | Freq: Two times a day (BID) | OROMUCOSAL | Status: DC
  Administered 2019-03-22 – 2019-03-24 (×6): 15 mL via OROMUCOSAL

## 2019-03-22 NOTE — Progress Notes (Signed)
Occupational Therapy Weekly Progress Note  Patient Details  Name: David [name] MRN: [id number] Date of Birth: 1931-12-06  Beginning of progress report period: 03/13/19 End of progress report period: 03/22/19  Patient has met 1 of 3 short term goals.    Pt has made minimal progress at time of report. He continues to require +2 assist for self care tasks completed bedlevel. +3 assist using Stedy lift was needed for toilet transfer last week. Pt exhibits profound cognitive and physical deficits that limit functional gains. POC adjusted to reflect slow progress. Please see POC for details.       Patient continues to demonstrate the following deficits: muscle weakness, decreased cardiorespiratoy endurance, impaired timing and sequencing, unbalanced muscle activation, motor apraxia, ataxia, hemiplegia, decreased coordination and decreased motor planning, decreased visual acuity and decreased visual perceptual skills, decreased motor planning and ideational apraxia, decreased initiation, decreased attention, decreased awareness, decreased problem solving and decreased safety awareness and decreased sitting balance, decreased standing balance, decreased postural control and decreased balance strategies and therefore will continue to benefit from skilled OT intervention to enhance overall performance with BADL.  Patient not progressing toward long term goals.  See goal revision..  Plan of care revisions: Please see POC for details.  OT Short Term Goals Week 1:  OT Short Term Goal 1 (Week 1): Pt will be able to transfer with assist of 1 person. OT Short Term Goal 1 - Progress (Week 1): Not met OT Short Term Goal 2 (Week 1): Pt will maintain dynamic sititng balance on EOB for 3 minutes or more with min A for self care tasks. OT Short Term Goal 2 - Progress (Week 1): Met OT Short Term Goal 3 (Week 1): Pt will perform UB dressing with max A consistently to decrease assistance with self care tasks. OT Short  Term Goal 3 - Progress (Week 1): Not met Week 2:  OT Short Term Goal 1 (Week 2): Pt will complete 2 grooming tasks EOB to improve sitting balance  OT Short Term Goal 2 (Week 2): Pt will maintain sustained attention for 3 minutes during a functional task    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); sacral wound Restrictions Weight Bearing Restrictions: No Vital Signs: Therapy Vitals Temp: (!) 97.5 F (36.4 C) Pulse Rate: 71 Resp: (!) 22 BP: (!) 144/100(RN Stacey notified) Patient Position (if appropriate): Lying Oxygen Therapy SpO2: 98 % O2 Device: Room Air ADL: ADL Eating: Moderate assistance Where Assessed-Eating: Chair Grooming: Dependent Where Assessed-Grooming: Bed level Upper Body Bathing: Not assessed Lower Body Bathing: Not assessed Upper Body Dressing: Not assessed Lower Body Dressing: Not assessed Toileting: Not assessed Toilet Transfer: Not assessed Tub/Shower Transfer: Not assessed :     Therapy/Group: Individual Therapy  Sabrine Patchen A Kimble Hitchens 03/22/2019, 4:07 PM

## 2019-03-22 NOTE — Progress Notes (Signed)
[facility] PHYSICAL MEDICINE & REHABILITATION PROGRESS NOTE   Subjective/Complaints:   More somnolent today.  Core track in place  ROS: Limited due to cognitive/behavioral    Objective:   No results found. No results for input(s): WBC, HGB, HCT, PLT in the last 72 hours. No results for input(s): NA, K, CL, CO2, GLUCOSE, BUN, CREATININE, CALCIUM in the last 72 hours.  Intake/Output Summary (Last 24 hours) at 03/22/2019 1139 Last data filed at 03/22/2019 0636 Gross per 24 hour  Intake 437 ml  Output 1000 ml  Net -563 ml     Physical Exam: Vital Signs Blood pressure 131/89, pulse 78, temperature 97.7 F (36.5 C), temperature source Oral, resp. rate 16, height 6\' 3"  (1.905 m), weight 124.3 kg, SpO2 95 %.  Constitutional: No distress . Vital signs reviewed. HEENT: EOMI, oral membranes moist Neck: supple Cardiovascular: RRR without murmur. No JVD    Respiratory: CTA Bilaterally without wheezes or rales. Normal effort    GI: BS +, non-tender, non-distended  Extremities: No clubbing, cyanosis, or edema Skin: No evidence of breakdown, no evidence of rash Neurologic: Cranial nerves II through XII intact, motor strength is 4/5 in Left and 3- RIght deltoid, bicep, tricep, grip,2- Right and 4/5 left  hip flexor, knee extensors, ankle dorsiflexor and plantar flexor- Unchanged 4/25 Sensory exam cannot assess due to cognition/aphasia   Cerebellar exam cannot assess due to cognition as well as strength on Right side Musculoskeletal: . No joint swelling    Assessment/Plan: 1. Functional deficits secondary to Left PCA and PICA infarcts  which require 3+ hours per day of interdisciplinary therapy in a comprehensive inpatient rehab setting.  Physiatrist is providing close team supervision and 24 hour management of active medical problems listed below.  Physiatrist and rehab team continue to assess barriers to discharge/monitor patient progress toward functional and medical goals  Care  Tool:  Bathing        Body parts bathed by helper: Right arm, Left upper leg, Left arm, Right lower leg, Chest, Abdomen, Left lower leg, Face, Front perineal area, Buttocks, Right upper leg Body parts n/a: Abdomen   Bathing assist Assist Level: 2 Helpers     Upper Body Dressing/Undressing Upper body dressing   What is the patient wearing?: Button up shirt    Upper body assist Assist Level: Maximal Assistance - Patient 25 - 49%    Lower Body Dressing/Undressing Lower body dressing      What is the patient wearing?: Incontinence brief, Pants     Lower body assist Assist for lower body dressing: 2 Helpers     Toileting Toileting Toileting Activity did not occur Landscape architect and hygiene only): Refused  Toileting assist Assist for toileting: 2 Helpers(bedlevel)     Transfers Chair/bed transfer  Transfers assist     Chair/bed transfer assist level: 2 Helpers     Locomotion Ambulation   Ambulation assist   Ambulation activity did not occur: Safety/medical concerns          Walk 10 feet activity   Assist  Walk 10 feet activity did not occur: Safety/medical concerns        Walk 50 feet activity   Assist Walk 50 feet with 2 turns activity did not occur: Safety/medical concerns         Walk 150 feet activity   Assist Walk 150 feet activity did not occur: Safety/medical concerns         Walk 10 feet on uneven surface  activity  Assist Walk 10 feet on uneven surfaces activity did not occur: Safety/medical concerns         Wheelchair     Assist Will patient use wheelchair at discharge?: Yes Type of Wheelchair: Manual Wheelchair activity did not occur: Safety/medical concerns(use of TIS due to positioning)  Wheelchair assist level: Dependent - Patient 0%(TIS)      Wheelchair 50 feet with 2 turns activity    Assist        Assist Level: Dependent - Patient 0%   Wheelchair 150 feet activity     Assist      Assist Level: Dependent - Patient 0%    Medical Problem List and Plan: 1.Decreased functional mobility, right homonymous hemianopsia as well as cognitive impairmentssecondary to bilateral posterior circulation infarctions as well as history of CVA-  --Continue CIR therapies including PT, OT, and SLP  Fluctuating level of alertness, related to 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 Antithrombotics: -DVT/anticoagulation:Eliquis- monitor for anemia -antiplatelet therapy: N/A 3. Pain Management:Tylenol as needed 4. Mood:Provide emotional support -antipsychotic agents: Seroquel 25 mg daily at bedtime 5. Neuropsych: This patientis notcapable of making decisions on hisown behalf. 6. Skin/Wound Care:Routine skin checks 7. Fluids/Electrolytes/Nutrition:Core track feeds, dietary following  8. History of atrial fibrillation. Cardiac rate controlled. Continue Eliquis 9. Hematuria/UTI. Followed by urology services Dr.Dahlstedt. Continue Flomax 0.4 mg twice daily. Complete course of Rocephin for UTI prior to voiding trial 10. Hypertension. Lopressor 12.5 mg twice a day. Monitor with increased mobility Vitals:   03/22/19 0343 03/22/19 0346  BP: (!) 130/101 131/89  Pulse: 90 78  Resp: 16   Temp: 97.7 F (36.5 C)   SpO2: 95%   controlled 4/26 11. Type 2 diabetes mellitus. Sliding scale  insulin. Check blood sugars before meals and at bedtime. Patient on Glucophage 500 mg twice daily prior to admission. Resume as needed CBG (last 3)  Recent Labs    03/21/19 2352 03/22/19 0349 03/22/19 0748  GLUCAP 174* 98 179*   -good control 4/25, anticipate more difficulty now that patient is on tube feeds, continue to monitor for now 12. Hyperlipidemia. Zocor 13. History of laryngeal cancer. Status post laryngectomy, no trach tube large stoma, no need for continuous pulse ox.Difficult to occlude trach with exception of gloved finger Monitor for any increased secretions  14.  Mild HyperNatremia- may be fluid related will monitor 15.  Goals of care- spoke with wife about need fo rCortrak but emphasized that this is a short term solution.  Will need to consider PEG if swallow function does not return.  Discussed that the cognitive effects of the stroke are severe and limit learning compensatory strategies for swallowing.  THerefore prognosis for improvement in swallow fxn are guarded at this time.  Discussed that we may need to consult Palliative care in 1-2 wks to discuss [place], wife understands LOS: 10 days A FACE TO [place] E Benney [name] 03/22/2019, 11:39 AM

## 2019-03-22 NOTE — Progress Notes (Signed)
+/-   sleep. Bilateral mittens to keep patient from pulling at coretrak. I & O cath at 2330=300, 0630=300cc's. Hematuria and malodorous. Patrici Ranks A

## 2019-03-23 ENCOUNTER — Inpatient Hospital Stay (HOSPITAL_COMMUNITY): Payer: Medicare Other | Admitting: Occupational Therapy

## 2019-03-23 ENCOUNTER — Inpatient Hospital Stay (HOSPITAL_COMMUNITY): Payer: Medicare Other | Admitting: Physical Therapy

## 2019-03-23 ENCOUNTER — Inpatient Hospital Stay (HOSPITAL_COMMUNITY): Payer: Medicare Other | Admitting: Speech Pathology

## 2019-03-23 LAB — GLUCOSE, CAPILLARY
Glucose-Capillary: 116 mg/dL — ABNORMAL HIGH (ref 70–99)
Glucose-Capillary: 103 mg/dL — ABNORMAL HIGH (ref 70–99)
Glucose-Capillary: 160 mg/dL — ABNORMAL HIGH (ref 70–99)
Glucose-Capillary: 143 mg/dL — ABNORMAL HIGH (ref 70–99)
Glucose-Capillary: 163 mg/dL — ABNORMAL HIGH (ref 70–99)

## 2019-03-23 MED ORDER — INSULIN ASPART 100 UNIT/ML ~~LOC~~ SOLN
0.0000 [IU] | Freq: Every day | SUBCUTANEOUS | Status: DC
  Administered 2019-03-23 (×2): 2 [IU] via SUBCUTANEOUS
  Administered 2019-03-23 – 2019-03-24 (×2): 1 [IU] via SUBCUTANEOUS
  Administered 2019-03-24: 22:00:00 2 [IU] via SUBCUTANEOUS
  Administered 2019-03-24: 07:00:00 1 [IU] via SUBCUTANEOUS

## 2019-03-23 MED ORDER — PANTOPRAZOLE SODIUM 40 MG PO PACK
40.0000 mg | PACK | Freq: Every day | ORAL | Status: DC
  Administered 2019-03-23 – 2019-03-24 (×2): 40 mg
  Filled 2019-03-23 (×2): qty 20

## 2019-03-23 NOTE — Progress Notes (Signed)
Physical Therapy Weekly Progress Note  Patient Details  Name: David [name] MRN: [id number] Date of Birth: January 24, 1932  Beginning of progress report period: March 13, 2019 End of progress report period: March 23, 2019  Today's Date: 03/23/2019 PT Individual Time: 2836-6294 PT Individual Time Calculation (min): 38 min   Patient has met 2 of 3 short term goals.  However, note patient demonstrates significant and severe cognitive and physical deficits which are having significant impact on patient's ability to make functional, meaningful progress with therapy. Functional performance is consistently inconsistent during therapy sessions.   Patient continues to demonstrate the following deficits muscle weakness, decreased cardiorespiratoy endurance and decreased oxygen support, impaired timing and sequencing, unbalanced muscle activation, motor apraxia, decreased coordination and decreased motor planning, decreased midline orientation and decreased motor planning, decreased initiation, decreased attention, decreased awareness, decreased problem solving, decreased safety awareness, decreased memory and delayed processing and decreased sitting balance, decreased standing balance, decreased postural control and decreased balance strategies and therefore will continue to benefit from skilled PT intervention to increase functional independence with mobility.  Patient progressing toward long term goals..  Continue plan of care, however pending patient performance moving forward anticipate that long term goals may ultimately need to be revised.   PT Short Term Goals Week 1:  PT Short Term Goal 1 (Week 1): Pt will be able to tolerate OOB x 2 hours for increased overall activity tolerance PT Short Term Goal 1 - Progress (Week 1): Met PT Short Term Goal 2 (Week 1): Pt will be able to perform functional transfer with max assist +2 PT Short Term Goal 2 - Progress (Week 1): Met PT Short Term Goal 3 (Week 1): Pt  will be able to demonstrate sitting balance during functional task with mod assist PT Short Term Goal 3 - Progress (Week 1): Not met Week 2:  PT Short Term Goal 1 (Week 2): Patient to be able to tolerate OOB for 4 hours  PT Short Term Goal 2 (Week 2): Patient to maintain dynamic sitting balance with Min guard for at least 10 minutes on 3/5 attempts  PT Short Term Goal 3 (Week 2): Patient to be able to perform functional transfers with ModAx2 on 3/5 attempts  PT Short Term Goal 4 (Week 2): Patient to be able to perform bed mobility with min guard   Skilled Therapeutic Interventions/Progress Updates:    Patient received in bed, able to acknowledge therapist but with labored breathing, HR WNL throughout session and SpO2 91-92% in bed on room air. Required MaxAx1 to sit up into long sitting in bed, then maxAx2 to pivot around to EOB. Demonstrates severe and persistent R lateral lean today required heavy Mod-MaxA and max verbal and tactile cues for midline orientation and correcting to midline sitting at EOB. Attempted using stedy however patient enthusiastically refuses standing today, using vocal cord stimulator states "I just am not feeling well". Able to sit at EOB for approximately 15 minutes with Mod-maxA to maintain midline alignment, SpO2 in low 90s at EOB until last 2-3 minutes of sitting during which O2 dropped into high 80s and patient with significantly labored breathing, was assisted back to supine and repositioned in bed with totalA x2. SpO2 on room air returned to 93% once returned to supine. Performed SLRs, supine hip ABD, heel slides, and B UE flexion in bed, patient with difficulty staying awake during bed exercises. He was left in care of RN who arrived to perform tube feeding, all needs otherwise met this  afternoon. 22 minutes of skilled therapy time missed due to patient fatigue/malaise.   Therapy Documentation Precautions:  Precautions Precautions: Fall, Other (comment) Precaution  Comments: R inattention; R hemianopsia from old CVA; uses vocal stimulator for communication; trach collar for supplemental O2 over stoma site (h/o laryngectomy due to CA); sacral wound Restrictions Weight Bearing Restrictions: No General: PT Amount of Missed Time (min): 22 Minutes PT Missed Treatment Reason: Patient fatigue Pain: Pain Assessment Pain Scale: Faces Pain Score: 0-No pain Faces Pain Scale: No hurt   Therapy/Group: Individual Therapy  Deniece Ree PT, DPT, CBIS  Supplemental Physical Therapist [facility]    Pager [phone] Acute Rehab Office [phone]

## 2019-03-23 NOTE — Progress Notes (Addendum)
No void, at 2130, bladder scan=400cc's, I & O cath.=450cc's. at 0530, bladder scan=515cc's, I & O cath = 500cc's. Incontinent stool during night.David [name]   Urine cloudy with  Blood clots.

## 2019-03-23 NOTE — Progress Notes (Signed)
[place] PHYSICAL MEDICINE & REHABILITATION PROGRESS NOTE   Subjective/Complaints:   Hematuria and cloudy urine, pt cathed ~4x per day for restention, on flomax Pt remains aphasic but alert and following simple commands today  ROS: Limited due to cognitive/behavioral    Objective:   No results found. No results for input(s): WBC, HGB, HCT, PLT in the last 72 hours. No results for input(s): NA, K, CL, CO2, GLUCOSE, BUN, CREATININE, CALCIUM in the last 72 hours.  Intake/Output Summary (Last 24 hours) at 03/23/2019 0839 Last data filed at 03/23/2019 0527 Gross per 24 hour  Intake 240 ml  Output 1250 ml  Net -1010 ml     Physical Exam: Vital Signs Blood pressure 123/90, pulse 93, temperature 98.1 F (36.7 C), temperature source Oral, resp. rate 16, height 6\' 3"  (1.905 m), weight 124 kg, SpO2 98 %.  Constitutional: No distress . Vital signs reviewed. HEENT: EOMI, oral membranes moist Neck: supple Cardiovascular: RRR without murmur. No JVD    Respiratory: CTA Bilaterally without wheezes or rales. Normal effort    GI: BS +, non-tender, non-distended  Extremities: No clubbing, cyanosis, or edema Skin: No evidence of breakdown, no evidence of rash Neurologic: Cranial nerves II through XII intact, motor strength is 4/5 in Left and 3- RIght deltoid, bicep, tricep, grip,2- Right and 4/5 left  hip flexor, knee extensors, ankle dorsiflexor and plantar flexor- Unchanged 4/27 Sensory exam cannot assess due to cognition/aphasia   Cerebellar exam cannot assess due to cognition as well as strength on Right side Musculoskeletal: . No joint swelling    Assessment/Plan: 1. Functional deficits secondary to Left PCA and PICA infarcts  which require 3+ hours per day of interdisciplinary therapy in a comprehensive inpatient rehab setting.  Physiatrist is providing close team supervision and 24 hour management of active medical problems listed below.  Physiatrist and rehab team continue to  assess barriers to discharge/monitor patient progress toward functional and medical goals  Care Tool:  Bathing        Body parts bathed by helper: Right arm, Left upper leg, Left arm, Right lower leg, Chest, Abdomen, Left lower leg, Face, Front perineal area, Buttocks, Right upper leg Body parts n/a: Abdomen   Bathing assist Assist Level: 2 Helpers     Upper Body Dressing/Undressing Upper body dressing   What is the patient wearing?: Button up shirt    Upper body assist Assist Level: Maximal Assistance - Patient 25 - 49%    Lower Body Dressing/Undressing Lower body dressing      What is the patient wearing?: Incontinence brief, Pants     Lower body assist Assist for lower body dressing: 2 Helpers     Toileting Toileting Toileting Activity did not occur Landscape architect and hygiene only): Refused  Toileting assist Assist for toileting: 2 Helpers(bedlevel)     Transfers Chair/bed transfer  Transfers assist     Chair/bed transfer assist level: 2 Helpers     Locomotion Ambulation   Ambulation assist   Ambulation activity did not occur: Safety/medical concerns          Walk 10 feet activity   Assist  Walk 10 feet activity did not occur: Safety/medical concerns        Walk 50 feet activity   Assist Walk 50 feet with 2 turns activity did not occur: Safety/medical concerns         Walk 150 feet activity   Assist Walk 150 feet activity did not occur: Safety/medical concerns  Walk 10 feet on uneven surface  activity   Assist Walk 10 feet on uneven surfaces activity did not occur: Safety/medical concerns         Wheelchair     Assist Will patient use wheelchair at discharge?: Yes Type of Wheelchair: Manual Wheelchair activity did not occur: Safety/medical concerns(use of TIS due to positioning)  Wheelchair assist level: Dependent - Patient 0%(TIS)      Wheelchair 50 feet with 2 turns activity    Assist         Assist Level: Dependent - Patient 0%   Wheelchair 150 feet activity     Assist     Assist Level: Dependent - Patient 0%    Medical Problem List and Plan: 1.Decreased functional mobility, right homonymous hemianopsia as well as cognitive impairmentssecondary to bilateral posterior circulation infarctions as well as history of CVA-  --Continue CIR therapies including PT, OT, and SLP  Fluctuating level of alertness, related to 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 Antithrombotics: -DVT/anticoagulation:Eliquis- monitor for anemia -antiplatelet therapy: N/A 3. Pain Management:Tylenol as needed 4. Mood:Provide emotional support -antipsychotic agents: Seroquel 25 mg daily at bedtime 5. Neuropsych: This patientis notcapable of making decisions on hisown behalf. 6. Skin/Wound Care:Routine skin checks 7. Fluids/Electrolytes/Nutrition:Core track feeds, dietary following  8. History of atrial fibrillation. Cardiac rate controlled. Continue Eliquis 9. Hematuria/UTI. Followed by urology services Dr.Dahlstedt. Continue Flomax 0.4 mg twice daily. Complete course of Rocephin for UTI prior to voiding trial 10. Hypertension. Lopressor 12.5 mg twice a day. Monitor with increased mobility Vitals:   03/23/19 0420 03/23/19 0510  BP:  123/90  Pulse: (!) 55 93  Resp: 16 16  Temp:   98.1 F (36.7 C)  SpO2: 95% 98%  controlled 4/27 11. Type 2 diabetes mellitus. Sliding scale insulin. Check blood sugars before meals and at bedtime. Patient on Glucophage 500 mg twice daily prior to admission. Resume as needed CBG (last 3)  Recent Labs    03/22/19 2003 03/22/19 2357 03/23/19 0507  GLUCAP 154* 220* 116*   -good control 4/27 anticipate more difficulty now that patient is on tube feeds, continue to monitor for now 12. Hyperlipidemia. Zocor 13. History of laryngeal cancer. Status post laryngectomy, no trach tube large stoma, no need for continuous pulse ox.Difficult to occlude trach with exception of gloved finger Monitor for any increased secretions  14.  Mild HyperNatremia- may be fluid related will monitor 15.  Goals of care- spoke with wife about need fo rCortrak but emphasized that this is a short term solution.  Will need to consider PEG if swallow function does not return.  Discussed that the cognitive effects of the stroke are severe and limit learning compensatory strategies for swallowing.  THerefore prognosis for improvement in swallow fxn are guarded at this time.  Discussed that we may need to consult Palliative care in 1-2 wks to discuss [place], wife understands LOS: 11 days A FACE TO FACE EVALUATION WAS  PERFORMED  David [name] 03/23/2019, 8:39 AM

## 2019-03-23 NOTE — Progress Notes (Signed)
Occupational Therapy Session Note  Patient Details  Name: David [name] MRN: [id number] Date of Birth: 01/14/1932  Today's Date: 03/23/2019 OT Individual Time: 1020-1100 OT Individual Time Calculation (min): 40 min  35 minutes missed due to fatigue/low 02 sats   Short Term Goals: Week 2:  OT Short Term Goal 1 (Week 2): Pt will complete 2 grooming tasks EOB to improve sitting balance  OT Short Term Goal 2 (Week 2): Pt will maintain sustained attention for 3 minutes during a functional task     Skilled Therapeutic Interventions/Progress Updates:    Pt greeted in bed with RN staff completing oral care. He smiled and held OT's hand, nodding when asked if he wanted to get up. Supine<long sitting in bed completed with Max A and pt initiating using bedrail to pull himself up. While upright, pt applied lotion to dry skin, deodorant to underarms, and donned a clean shirt. Pt required close supervision-Min A for balance, though exhibited Lt lean and unable to correct with manual cuing. He required Max-Total A to complete these tasks, and total cuing for motor planning due to apraxia. He was provided with increased time to self organize and process instruction. While pt was attempting to don shirt, pts lethargy noticeably increased. 02 sats on RA 80-81% and we were unable to elevate sats with deep breathing alone. Pt returned to bed and was repositioned in partial sidelying (with HOB elevated) for pressure relief. Pt nodded when asked if he was comfortable. 02 sats 93%. He was left with all bedrails up, soft call bell, bed alarm set, and B hand mitts. 35 minutes missed. Tx focus placed on motor planning, sustained attention, and sitting balance during self care tasks.     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; uses vocal stimulator for communication; trach collar for supplemental O2 over stoma site  (h/o laryngectomy due to CA); sacral wound Restrictions Weight Bearing Restrictions: No Vital Signs: Therapy Vitals Resp: (!) 25 Oxygen Therapy O2 Device: Room Air Pain: Pt shook head when asked if he had pain. No s/s pain during session  Pain Assessment Pain Scale: (Pt somonlent) ADL: ADL Eating: Moderate assistance Where Assessed-Eating: Chair Grooming: Dependent Where Assessed-Grooming: Bed level Upper Body Bathing: Not assessed Lower Body Bathing: Not assessed Upper Body Dressing: Not assessed Lower Body Dressing: Not assessed Toileting: Not assessed Toilet Transfer: Not assessed Tub/Shower Transfer: Not assessed     Therapy/Group: Individual Therapy  Cindel Daugherty A Hailey Miles 03/23/2019, 11:21 AM

## 2019-03-23 NOTE — Progress Notes (Signed)
Speech Language Pathology Daily Session Note  Patient Details  Name: MAREO PORTILLA MRN: [id number] Date of Birth: 07-Jul-1932  Today's Date: 03/23/2019 SLP Individual Time: 3016-0109 SLP Individual Time Calculation (min): 45 min  Short Term Goals: Week 2: SLP Short Term Goal 1 (Week 2): Pt will consume trials of ice chips without any visible s/s of dysphagia.  SLP Short Term Goal 2 (Week 2): Pt will complete basic familiar tasks related to ADLs with Max A cues.  SLP Short Term Goal 3 (Week 2): Pt will sustain attention to basic task for ~ 5 minutes with Max A cues.  SLP Short Term Goal 4 (Week 2): Patient will functionally verbalize wants/needs at the phrase level in 5 out of 10 with Max A verbal cues for word-finding and Max A cues for use of electrolarynx.  SLP Short Term Goal 5 (Week 2): Pt will follow 1 step simple directions in 5 out of 10 opportunties with Max A cues.   Skilled Therapeutic Interventions:  Skilled treatment session focused on dysphagia and cognition goals. SLP received pt in bed. Pt very somnolent, no attempts to communicate. Bilateral mittens in place. SLP facilitated session by providing oral care as pt's mouth was coated with dry secretions and his dentures were in place. Clarified order for oral care x 4 per day and to remove pt's dentures at night. SLP further facilitated session by providing skilled observation of pt consuming ice chip. Pt with decreased oral attention to bolus as evidenced by little oral manipulation. Pt with nasal turbulence when creating intra-oral pressure. Pt with great difficulty staying awake at this point in the session, therefore trials were discontinued. Information on session conveyed to pt's MD.      Pain Pain Assessment Pain Scale: (Pt somonlent)  Therapy/Group: Individual Therapy  Maansi Wike 03/23/2019, 8:52 AM

## 2019-03-23 NOTE — Progress Notes (Signed)
Occupational Therapy Session Note  Patient Details  Name: David [name] MRN: [id number] Date of Birth: June 02, 1932  Today's Date: 03/23/2019 OT Individual Time: 2924-4628 OT Individual Time Calculation (min): 30 min    Short Term Goals: Week 2:  OT Short Term Goal 1 (Week 2): Pt will complete 2 grooming tasks EOB to improve sitting balance  OT Short Term Goal 2 (Week 2): Pt will maintain sustained attention for 3 minutes during a functional task   Skilled Therapeutic Interventions/Progress Updates:    Treatment session with focus on bed mobility, sustained attention, and following directions during LB self-care task.  Pt received supine in bed with legs hanging off EOB.  Repositioned with mod cues for pt to follow one step commands during bed mobility and rolling.  Pt required hand over hand assist for initiation and sequencing during mobility and when washing face.  Pt incontinent of bowel, therefore engaged in rolling for hygiene, requiring max assist for rolling and 2nd person to maintain pt in sidelying while hygiene completed.  Repositioned in bed for improved positioning with +2.  Pt often closing eyes during session, however engaging when asked to participate in various tasks.  Remained semi-reclined in bed with all needs in reach.  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); sacral wound Restrictions Weight Bearing Restrictions: No General:   Vital Signs: Therapy Vitals Resp: (!) 25 Oxygen Therapy O2 Device: Room Air Pain: Pt with no c/o pain  Therapy/Group: Individual Therapy  Simonne Come 03/23/2019, 9:57 AM

## 2019-03-24 ENCOUNTER — Inpatient Hospital Stay (HOSPITAL_COMMUNITY): Payer: Medicare Other | Admitting: Occupational Therapy

## 2019-03-24 ENCOUNTER — Inpatient Hospital Stay (HOSPITAL_COMMUNITY): Payer: Medicare Other | Admitting: Physical Therapy

## 2019-03-24 ENCOUNTER — Inpatient Hospital Stay (HOSPITAL_COMMUNITY): Payer: Medicare Other | Admitting: Speech Pathology

## 2019-03-24 ENCOUNTER — Inpatient Hospital Stay (HOSPITAL_COMMUNITY): Payer: Medicare Other

## 2019-03-24 LAB — URINE CULTURE: Culture: NO GROWTH

## 2019-03-24 LAB — GLUCOSE, CAPILLARY
Glucose-Capillary: 115 mg/dL — ABNORMAL HIGH (ref 70–99)
Glucose-Capillary: 131 mg/dL — ABNORMAL HIGH (ref 70–99)
Glucose-Capillary: 214 mg/dL — ABNORMAL HIGH (ref 70–99)
Glucose-Capillary: 94 mg/dL (ref 70–99)
Glucose-Capillary: 191 mg/dL — ABNORMAL HIGH (ref 70–99)

## 2019-03-24 LAB — URINALYSIS, ROUTINE W REFLEX MICROSCOPIC
Bacteria, UA: NONE SEEN
Bilirubin Urine: NEGATIVE
Glucose, UA: NEGATIVE mg/dL
Ketones, ur: NEGATIVE mg/dL
Leukocytes,Ua: NEGATIVE
Nitrite: NEGATIVE
Protein, ur: NEGATIVE mg/dL
RBC / HPF: >50 RBC/hpf — ABNORMAL HIGH (ref 0–5)
Specific Gravity, Urine: 1.02 (ref 1.005–1.030)
pH: 7 (ref 5.0–8.0)

## 2019-03-24 MED ORDER — METOPROLOL TARTRATE 25 MG PO TABS
25.0000 mg | ORAL_TABLET | Freq: Two times a day (BID) | ORAL | Status: DC
  Administered 2019-03-24: 25 mg via ORAL
  Filled 2019-03-24: qty 1

## 2019-03-24 MED ORDER — METFORMIN HCL 500 MG PO TABS: 500.0000 mg | ORAL_TABLET | Freq: Every day | ORAL | Status: DC

## 2019-03-24 NOTE — Progress Notes (Signed)
Speech Language Pathology Note  Patient Details  Name: David [name] MRN: [id number] Date of Birth: 02/13/1932 Today's Date: 03/24/2019  Pt's therapy schedule modified to 15 over 7 d/t decreased ability to fully participate in all therapies.    Taneal Sonntag 03/24/2019, 7:59 AM

## 2019-03-24 NOTE — Progress Notes (Signed)
Speech Language Pathology Daily Session Note  Patient Details  Name: David [name] MRN: [id number] Date of Birth: Sep 09, 1932  Today's Date: 03/24/2019 SLP Individual Time: 5726-2035 SLP Individual Time Calculation (min): 45 min  Short Term Goals: Week 2: SLP Short Term Goal 1 (Week 2): Pt will consume trials of ice chips without any visible s/s of dysphagia.  SLP Short Term Goal 2 (Week 2): Pt will complete basic familiar tasks related to ADLs with Max A cues.  SLP Short Term Goal 3 (Week 2): Pt will sustain attention to basic task for ~ 5 minutes with Max A cues.  SLP Short Term Goal 4 (Week 2): Patient will functionally verbalize wants/needs at the phrase level in 5 out of 10 with Max A verbal cues for word-finding and Max A cues for use of electrolarynx.  SLP Short Term Goal 5 (Week 2): Pt will follow 1 step simple directions in 5 out of 10 opportunties with Max A cues.   Skilled Therapeutic Interventions:  Skilled treatment session focused on basic cognition. When SLP entered room and spoke to pt, he was able to arouse and took SLP's arm and hugged it to himself. Pleasant encouragement and support provided. Pt intermittently opened eyes when spoken to. Pt able to gestures yes/no to basic questions with head nods but no attempt to initiate communication or spoken speech (I.e., lip movements) to answer any questions. He refused PO intake. This Producer, television/film/video pt's team requesting pt's wife to be allowed to visit pt to aid in communication since pt is not longer able to utilize electrolarynx and also for pt's wife to visual severity of pt's deficits so she can make the most informed decisions regarding further means of nutrition. This Probation officer also consulted with PA regarding possibility of UA and PA didn't feel one was indicated at this time. Also spoke with SLP who treated pt on acute to verify level of decline pt is currently exhibiting. Wife to come in on 4/29 at 0900 for education with therapy.       Pain Pain Assessment Pain Scale: (inability to indicate)  Therapy/Group: Individual Therapy  [name] 03/24/2019, 12:41 PM

## 2019-03-24 NOTE — Progress Notes (Signed)
[place] PHYSICAL MEDICINE & REHABILITATION PROGRESS NOTE   Subjective/Complaints:   Patient appears bright and alert this morning.  He can follow simple commands.  Reviewed urinalysis showing evidence of hematuria  ROS: Limited due to cognitive/behavioral    Objective:   No results found. No results for input(s): WBC, HGB, HCT, PLT in the last 72 hours. No results for input(s): NA, K, CL, CO2, GLUCOSE, BUN, CREATININE, CALCIUM in the last 72 hours.  Intake/Output Summary (Last 24 hours) at 03/24/2019 1425 Last data filed at 03/24/2019 0837 Gross per 24 hour  Intake 0 ml  Output 400 ml  Net -400 ml     Physical Exam: Vital Signs Blood pressure 135/75, pulse (!) 111, temperature 97.6 F (36.4 C), resp. rate 17, height 6\' 3"  (1.905 m), weight 124 kg, SpO2 98 %.  Constitutional: No distress . Vital signs reviewed. HEENT: EOMI, oral membranes moist Neck: supple Cardiovascular: RRR without murmur. No JVD    Respiratory: CTA Bilaterally without wheezes or rales. Normal effort    GI: BS +, non-tender, non-distended  Extremities: No clubbing, cyanosis, or edema Skin: No evidence of breakdown, no evidence of rash Neurologic: Cranial nerves II through XII intact, motor strength is 4/5 in Left and 3- RIght deltoid, bicep, tricep, grip,2- Right and 4/5 left  hip flexor, knee extensors, ankle dorsiflexor and plantar flexor- Unchanged 4/27 Sensory exam cannot assess due to cognition/aphasia   Cerebellar exam cannot assess due to cognition as well as strength on Right side Musculoskeletal: . No joint swelling    Assessment/Plan: 1. Functional deficits secondary to Left PCA and PICA infarcts  which require 3+ hours per day of interdisciplinary therapy in a comprehensive inpatient rehab setting.  Physiatrist is providing close team supervision and 24 hour management of active medical problems listed below.  Physiatrist and rehab team continue to assess barriers to discharge/monitor  patient progress toward functional and medical goals  Care Tool:  Bathing    Body parts bathed by patient: Face   Body parts bathed by helper: Right arm, Left upper leg, Left arm, Right lower leg, Chest, Abdomen, Left lower leg, Face, Front perineal area, Buttocks, Right upper leg Body parts n/a: Abdomen   Bathing assist Assist Level: Dependent - Patient 0%     Upper Body Dressing/Undressing Upper body dressing   What is the patient wearing?: Button up shirt    Upper body assist Assist Level: Total Assistance - Patient < 25%    Lower Body Dressing/Undressing Lower body dressing      What is the patient wearing?: Incontinence brief, Pants     Lower body assist Assist for lower body dressing: 2 Helpers     Toileting Toileting Toileting Activity did not occur Landscape architect and hygiene only): Refused  Toileting assist Assist for toileting: 2 Helpers(bedlevel)     Transfers Chair/bed transfer  Transfers assist     Chair/bed transfer assist level: 2 Helpers     Locomotion Ambulation   Ambulation assist   Ambulation activity did not occur: Safety/medical concerns          Walk 10 feet activity   Assist  Walk 10 feet activity did not occur: Safety/medical concerns        Walk 50 feet activity   Assist Walk 50 feet with 2 turns activity did not occur: Safety/medical concerns         Walk 150 feet activity   Assist Walk 150 feet activity did not occur: Safety/medical concerns  Walk 10 feet on uneven surface  activity   Assist Walk 10 feet on uneven surfaces activity did not occur: Safety/medical concerns         Wheelchair     Assist Will patient use wheelchair at discharge?: Yes Type of Wheelchair: Manual Wheelchair activity did not occur: Safety/medical concerns(use of TIS due to positioning)  Wheelchair assist level: Dependent - Patient 0%      Wheelchair 50 feet with 2 turns activity    Assist         Assist Level: Dependent - Patient 0%   Wheelchair 150 feet activity     Assist     Assist Level: Dependent - Patient 0%    Medical Problem List and Plan: 1.Decreased functional mobility, right homonymous hemianopsia as well as cognitive impairmentssecondary to bilateral posterior circulation infarctions as well as history of CVA-  --Continue CIR therapies including PT, OT, and SLP  Fluctuating level of alertness, related to 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 as well as multi infarct dementia Dysphagia with nasal regurg and pooling of nasopharynx- hx laryngectomy as well as extensive CVA  While asp PNA not possible due to trach a severe sinusitis may occur Has acute infarcts Left PCA territory as well as right temporal lobe in addition to old left occipital , cerebellar and deep white matter infarcts Pt with receptive aphasia, Multi infarct dementia which limit learning of compensatory strategies for swllow, will place cortrak, discuss PEG with pt's wife and if this is not pursued consider palliative for GOC  Discuss swallowing with speech during conference tomorrow 2. Antithrombotics: -DVT/anticoagulation:Eliquis- monitor for anemia -antiplatelet therapy: N/A 3. Pain Management:Tylenol as needed 4. Mood:Provide emotional support -antipsychotic agents: Seroquel 25 mg daily at bedtime 5. Neuropsych: This patientis notcapable of making decisions on hisown behalf. 6. Skin/Wound Care:Routine skin checks 7. Fluids/Electrolytes/Nutrition:Core track feeds, dietary following  8. History of atrial fibrillation. Cardiac rate controlled. Continue Eliquis 9. Hematuria/UTI. Followed by urology services Dr.Dahlstedt. Continue Flomax 0.4 mg twice daily. Complete course of Rocephin for UTI prior to voiding trial 10. Hypertension. Lopressor 12.5 mg twice a day. Monitor with increased mobility Vitals:   03/24/19 0747 03/24/19  1157  BP:    Pulse: (!) 111   Resp: 20 17  Temp:    SpO2: 98%   Elevated HR 4/28 11. Type 2 diabetes mellitus. Sliding scale insulin. Check blood sugars before meals and at bedtime. Patient on Glucophage 500 mg twice daily prior to admission. Resume as needed CBG (last 3)  Recent Labs    03/24/19 0306 03/24/19 0610 03/24/19 1145  GLUCAP 115* 131* 214*   -lability 4/28 anticipate more difficulty now that patient is on tube feeds, resume glucophage 12. Hyperlipidemia. Zocor 13. History of laryngeal cancer. Status post laryngectomy, no trach tube large stoma, no need for continuous pulse ox.Difficult to occlude trach with exception of gloved finger Monitor for any increased secretions  14.  Mild HyperNatremia- may be fluid related will monitor 15.  Goals of care- spoke with wife about need fo rCortrak but emphasized that this is a short term solution.  Will need to consider PEG if swallow function does not return.  Discussed that the cognitive effects of the stroke are severe and limit learning compensatory strategies for swallowing.  THerefore prognosis for improvement in swallow fxn are guarded at this time.  Discussed that we may need to consult Palliative care in 1-2 wks to discuss Kinta, wife understands LOS: 12 days A FACE TO FACE  EVALUATION WAS PERFORMED  Charlett Blake 03/24/2019, 2:25 PM

## 2019-03-24 NOTE — Progress Notes (Signed)
Physical Therapy Session Note  Patient Details  Name: David [name] MRN: [id number] Date of Birth: 1931/12/20  Today's Date: 03/24/2019 PT Individual Time: 1333-1400 PT Individual Time Calculation (min): 27 min   Short Term Goals: Week 2:  PT Short Term Goal 1 (Week 2): Patient to be able to tolerate OOB for 4 hours  PT Short Term Goal 2 (Week 2): Patient to maintain dynamic sitting balance with Min guard for at least 10 minutes on 3/5 attempts  PT Short Term Goal 3 (Week 2): Patient to be able to perform functional transfers with ModAx2 on 3/5 attempts  PT Short Term Goal 4 (Week 2): Patient to be able to perform bed mobility with min guard   Skilled Therapeutic Interventions/Progress Updates: Pt presented in bed sleeping but able to arouse. Pt agreeable to sit EOB. Performed supine to sit with maxA x 2 requiring max multimodal cues for hand placement and decreasing posterior lean. Pt sat at EOB x 10 min with sitting balance varying between min guard to minA due to increasing posterior lean. Pt refused to perform any standing attempts. PTA encouraged pt to perform forward leans while reaching outside BOS which pt was able to perform intermittently. Pt was then returned to bed dependent x 2 and repositioned for comfort. Pt left in bed with soft touch button within reach and bed alarm set.      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); sacral wound Restrictions Weight Bearing Restrictions: No General:   Vital Signs: Therapy Vitals Temp: (!) 97.4 F (36.3 C) Temp Source: Oral Pulse Rate: 61 Resp: 16 BP: 129/89 Patient Position (if appropriate): Lying Oxygen Therapy SpO2: 94 % O2 Device: Room Air Pain: Pain Assessment Pain Scale: (inability to  indicate) Faces Pain Scale: Hurts little more Pain Type: Chronic pain Pain Location: Knee Pain Orientation: Right Pain Descriptors / Indicators: Grimacing Pain Onset: With Activity Pain Intervention(s): Repositioned;Rest    Therapy/Group: Individual Therapy  Ashanti Ratti  Kenston Longton, PTA  03/24/2019, 4:34 PM

## 2019-03-24 NOTE — Progress Notes (Signed)
Occupational Therapy Session Note  Patient Details  Name: David [name] MRN: [id number] Date of Birth: Apr 01, 1932  Today's Date: 03/24/2019 OT Individual Time: 1000-1036 OT Individual Time Calculation (min): 36 min  and Today's Date: 03/24/2019 OT Missed Time: 24 Minutes Missed Time Reason: Patient fatigue   Short Term Goals: Week 2:  OT Short Term Goal 1 (Week 2): Pt will complete 2 grooming tasks EOB to improve sitting balance  OT Short Term Goal 2 (Week 2): Pt will maintain sustained attention for 3 minutes during a functional task   Skilled Therapeutic Interventions/Progress Updates:    Upon entering the room, pt in wheelchair with RN present in room to give medication. Pt appearing lethargic and did not open eyes even when receiving injection. OT attempting to alert pt in various ways including cold wash cloth placed on face and pt did not reaction or make any attempt to move. Hand over hand to use wash clothing to wash face this session. OT utilized suction toothbrush for oral care with pt not making any attempts to assist therapist. Pt not opening eyes during this time either but did begin to bite suction in mouth. OT repositioned pt in wheelchair for comfort and safety. Pt unable to attend to tasks or actively participate in OT intervention at this time. Chair alarm donned for safety. Pt remaining in chair and returned to RN stations for safety.  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); sacral wound Restrictions Weight Bearing Restrictions: No General: General OT Amount of Missed Time: 24 Minutes Vital Signs: Therapy Vitals Pulse Rate: (!) 111 Resp: 20 Oxygen Therapy SpO2: 98 % O2 Device: Room Air ADL: ADL Eating: Moderate assistance Where  Assessed-Eating: Chair Grooming: Dependent Where Assessed-Grooming: Bed level Upper Body Bathing: Not assessed Lower Body Bathing: Not assessed Upper Body Dressing: Not assessed Lower Body Dressing: Not assessed Toileting: Not assessed Toilet Transfer: Not assessed Tub/Shower Transfer: Not assessed   Therapy/Group: Individual Therapy  Gypsy Decant 03/24/2019, 10:41 AM

## 2019-03-24 NOTE — Progress Notes (Signed)
Physical Therapy Session Note  Patient Details  Name: David [name] MRN: [id number] Date of Birth: [birth year]  Today's Date: 03/24/2019 PT Individual Time: 0833-0930 PT Individual Time Calculation (min): 57 min   Short Term Goals: Week 2:  PT Short Term Goal 1 (Week 2): Patient to be able to tolerate OOB for 4 hours  PT Short Term Goal 2 (Week 2): Patient to maintain dynamic sitting balance with Min guard for at least 10 minutes on 3/5 attempts  PT Short Term Goal 3 (Week 2): Patient to be able to perform functional transfers with ModAx2 on 3/5 attempts  PT Short Term Goal 4 (Week 2): Patient to be able to perform bed mobility with min guard   Skilled Therapeutic Interventions/Progress Updates:    Patient in supine, assisted with NT to boost up in bed +2 A.  Patient with brief on, total A to loop legs into pants in supine.  Assist for legs off bed and to lift trunk pt pulling up.  Patient scooted to EOB mod A.  Seated EOB about 10 minutes with S to CGA.  Able to fix when leaning to R reaching to hold onto rail at the top.  BP taken seated 122/80, after sitting about 6-7 more minutes pt fatiguing and leaning back and to L mod A for balance BP retaken in sitting 116/68.  Patient with +2 for attempt to stand in stedy to pull up pants, but pt pushing back.  Allowed to return to supine +2 A.  Rested in sidelying about 4-5 minutes.  Patient side to sit with mod to max A.  SBT to L into chair +2 A.  Patient in [place] w/c assisted to ortho gym.  Performed sit to stand in standing frame and cues for L weight shift due to c/o R knee pain.  Stood about 20 seconds.  Assisted to sitting and back to nursing station positioned head in TIS w/c for comfort and with alarm belt activated.    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

## 2019-03-24 NOTE — Progress Notes (Addendum)
Social Work Patient ID: David [name], male   DOB: [birth year], 85 y.o.   MRN: [id number] Met with team who feel pt's wife needs to come in and see him and assist with communication. Have scheduled for tomorrow @ 9:00 am for most of the day. Will see if pt communicates more with wife when here and for wife to see his level of care/function. She is aware she will need to make a decision soon regarding having a PEG placed. Wife is aware of her insurance and the liklihood they will not cover NHP. Will see tomorrow.

## 2019-03-25 ENCOUNTER — Inpatient Hospital Stay (HOSPITAL_COMMUNITY): Payer: Medicare Other | Admitting: Physical Therapy

## 2019-03-25 ENCOUNTER — Encounter (HOSPITAL_COMMUNITY): Payer: Medicare Other | Admitting: Occupational Therapy

## 2019-03-25 ENCOUNTER — Encounter (HOSPITAL_COMMUNITY): Payer: Medicare Other | Admitting: Speech Pathology

## 2019-03-25 LAB — BLOOD GAS, ARTERIAL
Acid-base deficit: 13 mmol/L — ABNORMAL HIGH (ref 0.0–2.0)
Bicarbonate: 16.1 mmol/L — ABNORMAL LOW (ref 20.0–28.0)
FIO2: 100
O2 Saturation: 98 %
Patient temperature: 98.6
pCO2 arterial: 63.5 mmHg — ABNORMAL HIGH (ref 32.0–48.0)
pH, Arterial: 7.032 — CL (ref 7.350–7.450)
pO2, Arterial: 262 mmHg — ABNORMAL HIGH (ref 83.0–108.0)

## 2019-03-25 LAB — GLUCOSE, CAPILLARY: Glucose-Capillary: 141 mg/dL — ABNORMAL HIGH (ref 70–99)

## 2019-03-26 ENCOUNTER — Telehealth: Payer: Self-pay | Admitting: Family Medicine

## 2019-03-26 NOTE — Telephone Encounter (Signed)
Called his wife.  She thanked me for the call.

## 2019-03-26 NOTE — Telephone Encounter (Signed)
Attempted to call wife at [phone].  No answer.  Couldn't leave a message.  I wanted to call and offer condolences.   I was always glad to see this gentleman here in the clinic.

## 2019-03-27 NOTE — Discharge Summary (Signed)
Physician Discharge Summary  Patient ID: David [name] MRN: [id number] DOB/AGE: 1932/10/10 85 y.o.  Admit date: 02/26/2019 Discharge date: 09-Apr-2019  Discharge Diagnoses:  Principal Problem:   Hemiparesis affecting right side as late effect of stroke [facility]) Active Problems:   Essential hypertension   Atrial fibrillation with RVR (HCC)   Acute embolic stroke ([place]) multifocal d/t AF s/p tPA.   Urinary retention   Hemianopia, homonymous, right   Cerebrovascular accident (CVA) of left thalamus ([place])   Discharged Condition: deceased  Significant Diagnostic Studies: Ct Angio Head W Or Wo Contrast  Result Date: 03/05/2019 CLINICAL DATA:  Right-sided facial droop and weakness EXAM: CT ANGIOGRAPHY HEAD AND NECK TECHNIQUE: Multidetector CT imaging of the head and neck was performed using the standard protocol during bolus administration of intravenous contrast. Multiplanar CT image reconstructions and MIPs were obtained to evaluate the vascular anatomy. Carotid stenosis measurements (when applicable) are obtained utilizing NASCET criteria, using the distal internal carotid diameter as the denominator. CONTRAST:  155mL OMNIPAQUE IOHEXOL 350 MG/ML SOLN COMPARISON:  None. Head CT 03/05/2019 FINDINGS: CTA NECK FINDINGS SKELETON: There is no bony spinal canal stenosis. No lytic or blastic lesion. OTHER NECK: Prior left neck dissection with tracheostomy. Status post laryngectomy. UPPER CHEST: No pneumothorax or pleural effusion. No nodules or masses. AORTIC ARCH: There is mild calcific atherosclerosis of the aortic arch. There is no aneurysm, dissection or hemodynamically significant stenosis of the visualized ascending aorta and aortic arch. Conventional 3 vessel aortic branching pattern. The visualized proximal subclavian arteries are widely patent. RIGHT CAROTID SYSTEM: --Common carotid artery: Widely patent origin without common carotid artery dissection or aneurysm. --Internal carotid artery: No  dissection, occlusion or aneurysm. Mild atherosclerotic calcification at the carotid bifurcation without hemodynamically significant stenosis. --External carotid artery: No acute abnormality. LEFT CAROTID SYSTEM: --Common carotid artery: Widely patent origin without common carotid artery dissection or aneurysm. --Internal carotid artery: No dissection, occlusion or aneurysm. Mild atherosclerotic calcification at the carotid bifurcation without hemodynamically significant stenosis. --External carotid artery: No acute abnormality. VERTEBRAL ARTERIES: Left dominant configuration. The left vertebral artery is occluded at its origin and throughout the V1 segment. There is opacification beginning at the proximal V2 segment there is moderate narrowing of the left vertebral artery lumen at the C5 level (6:116). The left vertebral artery is otherwise normal to the skull base. No right vertebral stenosis. CTA HEAD FINDINGS POSTERIOR CIRCULATION: --Vertebral arteries: There is moderate calcification of the V4 segment of the left vertebral artery without high-grade stenosis. --Posterior inferior cerebellar arteries (PICA): The left PICA is occluded proximally. Right PICA is patent. --Anterior inferior cerebellar arteries (AICA): Not clearly visualized, though this is not uncommon. --Basilar artery: Normal. --Superior cerebellar arteries: Normal. --Posterior cerebral arteries (PCA): The left posterior cerebral artery is occluded at the proximal P2 segment. The right PCA is patent. ANTERIOR CIRCULATION: --Intracranial internal carotid arteries: Normal. --Anterior cerebral arteries (ACA): Normal. Both A1 segments are present. Patent anterior communicating artery (a-comm). --Middle cerebral arteries (MCA): Normal. VENOUS SINUSES: As permitted by contrast timing, patent. ANATOMIC VARIANTS: None DELAYED PHASE: No parenchymal contrast enhancement. Review of the MIP images confirms the above findings. IMPRESSION: 1. Occlusion of the  left posterior cerebral artery at the proximal P2 segment. 2. Left PICA occlusion. 3. Occlusion of the left vertebral artery at its origin and throughout the V1 segment with moderate narrowing of the mid V2 segment. 4. Bilateral carotid bifurcation atherosclerosis with less than 50% stenosis. 5.  Aortic atherosclerosis (ICD10-I70.0). 6. Status post laryngectomy, tracheostomy and  left neck dissection. Electronically Signed   By: Ulyses Jarred M.D.   On: 03/05/2019 18:23   Dg Chest 1 View  Result Date: 03/06/2019 CLINICAL DATA:  Wheezing. EXAM: CHEST  1 VIEW COMPARISON:  Chest x-ray from earlier same day and chest x-ray dated 12/04/2018. FINDINGS: Heart size and mediastinal contours are stable. Lungs are clear. No pleural effusion or pneumothorax seen. Osseous structures about the chest are unremarkable. IMPRESSION: No active disease. No evidence of pneumonia or pulmonary edema. Electronically Signed   By: Franki Cabot M.D.   On: 03/06/2019 21:08   Dg Chest 2 View  Result Date: 03/15/2019 CLINICAL DATA:  Lethargy. EXAM: CHEST - 2 VIEW COMPARISON:  03/08/2019 FINDINGS: The cardio pericardial silhouette is enlarged. There is pulmonary vascular congestion with increased prominence of interstitial markings, concerning for dependent interstitial edema. Retrocardiac left base collapse/consolidation persists with small bilateral pleural effusions. Bones are diffusely demineralized. IMPRESSION: Cardiomegaly with vascular congestion and interstitial edema. Left base collapse/consolidation with small bilateral pleural effusions. Electronically Signed   By: Misty Stanley M.D.   On: 03/15/2019 17:47   Ct Head Wo Contrast  Result Date: 03/15/2019 CLINICAL DATA:  Patient has had increased lethargy and decreased responsiveness. EXAM: CT HEAD WITHOUT CONTRAST TECHNIQUE: Contiguous axial images were obtained from the base of the skull through the vertex without intravenous contrast. COMPARISON:  None. FINDINGS: Brain: No  evidence of acute infarction, hemorrhage, extra-axial collection, ventriculomegaly, or mass effect. Old left parieto-occipital infarct with encephalomalacia. Old left thalamus lacunar infarct. Old left cerebellar infarct. Generalized cerebral atrophy. Periventricular white matter low attenuation likely secondary to microangiopathy. Vascular: Cerebrovascular atherosclerotic calcifications are noted. Skull: Negative for fracture or focal lesion. Sinuses/Orbits: Visualized portions of the orbits are unremarkable. Visualized portions of the paranasal sinuses and mastoid air cells are unremarkable. Other: None. IMPRESSION: No acute intracranial pathology. Electronically Signed   By: Kathreen Devoid   On: 03/15/2019 17:51   Ct Head Wo Contrast  Result Date: 03/06/2019 CLINICAL DATA:  Stroke.  24 hours post tPA EXAM: CT HEAD WITHOUT CONTRAST TECHNIQUE: Contiguous axial images were obtained from the base of the skull through the vertex without intravenous contrast. COMPARISON:  CT head 03/05/2019 FINDINGS: Brain: Negative for acute hemorrhage Hypodensity left medial cerebellum again noted and slightly less prominent. Possible acute infarct. Moderate atrophy. Chronic microvascular ischemic changes throughout the white matter. Chronic infarct in the left occipital lobe. Chronic infarcts in the cerebellum bilaterally. Vascular: Negative for hyperdense vessel Skull: Negative Sinuses/Orbits: Paranasal sinuses clear. Bilateral cataract surgery. Other: None IMPRESSION: 1. Negative for acute hemorrhage 2. Atrophy and extensive chronic ischemic change 3. Persistent hypodensity left medial cerebellum may represent acute infarct. Recommend MRI Electronically Signed   By: Franchot Gallo M.D.   On: 03/06/2019 18:01   Ct Angio Neck W Or Wo Contrast  Result Date: 03/05/2019 CLINICAL DATA:  Right-sided facial droop and weakness EXAM: CT ANGIOGRAPHY HEAD AND NECK TECHNIQUE: Multidetector CT imaging of the head and neck was performed  using the standard protocol during bolus administration of intravenous contrast. Multiplanar CT image reconstructions and MIPs were obtained to evaluate the vascular anatomy. Carotid stenosis measurements (when applicable) are obtained utilizing NASCET criteria, using the distal internal carotid diameter as the denominator. CONTRAST:  172mL OMNIPAQUE IOHEXOL 350 MG/ML SOLN COMPARISON:  None. Head CT 03/05/2019 FINDINGS: CTA NECK FINDINGS SKELETON: There is no bony spinal canal stenosis. No lytic or blastic lesion. OTHER NECK: Prior left neck dissection with tracheostomy. Status post laryngectomy. UPPER CHEST: No pneumothorax  or pleural effusion. No nodules or masses. AORTIC ARCH: There is mild calcific atherosclerosis of the aortic arch. There is no aneurysm, dissection or hemodynamically significant stenosis of the visualized ascending aorta and aortic arch. Conventional 3 vessel aortic branching pattern. The visualized proximal subclavian arteries are widely patent. RIGHT CAROTID SYSTEM: --Common carotid artery: Widely patent origin without common carotid artery dissection or aneurysm. --Internal carotid artery: No dissection, occlusion or aneurysm. Mild atherosclerotic calcification at the carotid bifurcation without hemodynamically significant stenosis. --External carotid artery: No acute abnormality. LEFT CAROTID SYSTEM: --Common carotid artery: Widely patent origin without common carotid artery dissection or aneurysm. --Internal carotid artery: No dissection, occlusion or aneurysm. Mild atherosclerotic calcification at the carotid bifurcation without hemodynamically significant stenosis. --External carotid artery: No acute abnormality. VERTEBRAL ARTERIES: Left dominant configuration. The left vertebral artery is occluded at its origin and throughout the V1 segment. There is opacification beginning at the proximal V2 segment there is moderate narrowing of the left vertebral artery lumen at the C5 level (6:116).  The left vertebral artery is otherwise normal to the skull base. No right vertebral stenosis. CTA HEAD FINDINGS POSTERIOR CIRCULATION: --Vertebral arteries: There is moderate calcification of the V4 segment of the left vertebral artery without high-grade stenosis. --Posterior inferior cerebellar arteries (PICA): The left PICA is occluded proximally. Right PICA is patent. --Anterior inferior cerebellar arteries (AICA): Not clearly visualized, though this is not uncommon. --Basilar artery: Normal. --Superior cerebellar arteries: Normal. --Posterior cerebral arteries (PCA): The left posterior cerebral artery is occluded at the proximal P2 segment. The right PCA is patent. ANTERIOR CIRCULATION: --Intracranial internal carotid arteries: Normal. --Anterior cerebral arteries (ACA): Normal. Both A1 segments are present. Patent anterior communicating artery (a-comm). --Middle cerebral arteries (MCA): Normal. VENOUS SINUSES: As permitted by contrast timing, patent. ANATOMIC VARIANTS: None DELAYED PHASE: No parenchymal contrast enhancement. Review of the MIP images confirms the above findings. IMPRESSION: 1. 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 2. Left PICA occlusion. 3. Occlusion of the left vertebral artery at its origin and throughout the V1 segment with moderate narrowing of the mid V2 segment. 4. Bilateral carotid bifurcation atherosclerosis with less than 50% stenosis. 5.  Aortic atherosclerosis (ICD10-I70.0). 6. Status post laryngectomy, tracheostomy and left neck dissection. Electronically Signed   By: Ulyses Jarred M.D.   On: 03/05/2019 18:23   Mr Brain Wo Contrast  Result Date: 03/06/2019 CLINICAL DATA:  Altered mental status and possible stroke EXAM: MRI HEAD WITHOUT CONTRAST TECHNIQUE: Multiplanar, multiecho pulse sequences of the brain and surrounding structures were obtained without intravenous contrast. COMPARISON:  03/06/2019 head CT FINDINGS: BRAIN: There is  multifocal acute ischemia involving the left thalamus, both temporal lobes and the left cerebellum. The midline structures are normal. There are old infarcts of the left occipital lobe and left cerebellum. Diffuse confluent hyperintense T2-weighted signal within the periventricular, deep and juxtacortical white matter, most commonly due to chronic ischemic microangiopathy. Generalized atrophy without lobar predilection. No acute hemorrhage. No mass lesion. VASCULAR: The major intracranial arterial and venous sinus flow voids are normal. SKULL AND UPPER CERVICAL SPINE: Calvarial bone marrow signal is normal. There is no skull base mass. Visualized upper cervical spine and soft tissues are normal. SINUSES/ORBITS: No fluid levels or advanced mucosal thickening. No mastoid or middle ear effusion. The orbits are normal. IMPRESSION: 1. Multifocal bilateral posterior circulation acute ischemic infarcts, including the left thalamus, both medial temporal lobes and the left cerebellum. 2. No hemorrhage or mass effect. 3. Chronic ischemic microangiopathy  and multiple old infarcts. Electronically Signed   By: Ulyses Jarred M.D.   On: 03/06/2019 18:44   Dg Chest Port 1 View  Result Date: 03/08/2019 CLINICAL DATA:  Shortness of breath. History of stroke, diabetes and laryngeal cancer. EXAM: PORTABLE CHEST 1 VIEW COMPARISON:  Radiographs 03/06/2019. FINDINGS: 1155 hours. Stable cardiomegaly and aortic atherosclerosis. Patchy left-greater-than-right basilar opacities are slightly increased over the last few days. There is no confluent airspace opacity, edema or large pleural effusion. There is no pneumothorax or acute osseous abnormality. Telemetry leads overlie the chest. IMPRESSION: Mildly increased patchy bibasilar opacities, likely atelectasis. No consolidation or edema. Electronically Signed   By: Richardean Sale M.D.   On: 03/08/2019 12:49   Dg Chest Port 1 View  Result Date: 03/06/2019 CLINICAL DATA:  Altered mental  status. History of congestive heart failure. EXAM: PORTABLE CHEST 1 VIEW COMPARISON:  PA and lateral chest 12/04/2018 and 12/09/2017. FINDINGS: There is cardiomegaly without edema. Lung volumes are low with mild subsegmental atelectasis in the bases. No pneumothorax or pleural effusion. Aortic atherosclerosis noted. No acute bony abnormality. IMPRESSION: Cardiomegaly without edema.  No acute disease. Atherosclerosis. Electronically Signed   By: Inge Rise M.D.   On: 03/06/2019 13:29   Ct Head Code Stroke Wo Contrast  Result Date: 03/05/2019 CLINICAL DATA:  Code stroke. Right facial droop. Right-sided weakness. History of throat cancer. History of stroke. EXAM: CT HEAD WITHOUT CONTRAST TECHNIQUE: Contiguous axial images were obtained from the base of the skull through the vertex without intravenous contrast. COMPARISON:  MRI head 12/09/2017 FINDINGS: Brain: Moderate atrophy. Chronic infarct left occipital lobe. Chronic infarcts in the basal ganglia and white matter bilaterally. Ill-defined hypodensity left medial cerebellum is new since the prior CT and may represent acute infarct. Additional small areas of chronic infarct left cerebellum have developed since the prior MRI. Small chronic infarct in the right cerebellum. Negative for hemorrhage or mass. Vascular: Negative for hyperdense vessel. Atherosclerotic calcification at the skull base Skull: Negative Sinuses/Orbits: Paranasal sinuses clear. Normal orbit. Bilateral cataract surgery Other: None ASPECTS ([place] Stroke Program Early CT Score) - Ganglionic level infarction (caudate, lentiform nuclei, internal capsule, insula, M1-M3 cortex): 7 - Supraganglionic infarction (M4-M6 cortex): 3 Total score (0-10 with 10 being normal): 10 IMPRESSION: 1. Findings suspicious for a small area of acute infarction left medial cerebellum. 2. Extensive chronic ischemic changes as above. Chronic left PCA infarct. 3. ASPECTS is 10 Electronically Signed   By: Franchot Gallo M.D.   On: 03/05/2019 17:52    Labs:  Basic Metabolic Panel: Recent Labs  Lab 03/18/19 1320  NA 148*  K 4.7  CL 111  CO2 22  GLUCOSE 139*  BUN 27*  CREATININE 1.04  CALCIUM 9.7    CBC: No results for input(s): WBC, NEUTROABS, HGB, HCT, MCV, PLT in the last 168 hours.  CBG: Recent Labs  Lab 03/24/19 0610 03/24/19 1145 03/24/19 1439 03/24/19 1820 04/09/19 0057  GLUCAP 131* 214* 94 191* 141*   Family history. Father with cirrhosis. Negative colon cancer or prostate cancer  Brief HPI:    David [name] 85 year old right handed male history of diabetes mellitus laryngeal cancer status post laryngectomy, prior right PCA infarction with right homonymous hemianopsia as well as cognitive impairment, atrial fibrillation not on anticoagulation due to bouts of hematuria. Per chart review lives with spouse one level home. Presented 03/05/2019 after being found down on the floor with right-sided weakness and facial droop. Blood pressure 184/80. CT of the head showed  small area of acute infarction left medial cerebellum. CT angiogram of head and neck occlusion of the left posterior cerebral artery at the proximal P2 segment area left PICA occlusion. Patient did not receive TPA. MRI the brain showed multifocal bilateral posterior circulation acute ischemic infarctions involving the left thalamus both medial temporal lobes in the left cerebellum. No hemorrhage or mass effect. Echocardiogram with ejection fraction of 45% mild to moderate reduced systolic function. Inferior basal wall hypokinesis. Neurology consulted maintained on Eliquis after being cleared by urology services for noted bouts of hematuria. Urine culture greater than 100,000 Klebsiella completing a course of Rocephin. Patient with history of laryngeal cancer with laryngectomy noted fair amount of copious secretions latest chest x-ray showed patchy bibasilaror opacities consolidation or pneumonia.patient was on a regular diet.  Admitted for a comprehensive rehabilitation program  Hospital Course: David [name] was admitted to rehab 03/15/2019 for inpatient therapies to consist of PT, ST and OT at least three hours five days a week. Past admission physiatrist, therapy team and rehab RN have worked together to provide customized collaborative inpatient rehab. Pertaining to Mr. Ganser posterior circulation infarction patient slow to perform with therapies. Maintained on Eliquis for CVA prophylaxis. Mood stabilization with seroquel. History of atrial fibrillation cardiac rate monitored continued on anticoagulation. Bouts of hematuria followed by urology services completed a course of Rocephin for UTI as well as maintained on Flomax. Blood pressure monitored with low dose beta blocker.blood sugars with sliding scale insulin monitor closely while initially on tube feeds. Patient with history of laryngeal cancer laryngectomy large stoma noted it was difficult to occlude trach with exception of gloved finger followed by speech therapy. Patient with bouts of dysphagia with nasal regurg and poolling of nasopharynx. Monitor closely for aspiration a nasogastric tube was placed for nutritional support in light of dysphagia. Discussions were to be held with family on possible need for PEG tube and possibly pursuing palliative care.patient in usual state of health until early morning of 2019/04/14 found unresponsive noted to be in PEA arrest CODE BLUE called ACLS protocol to no avail patient pronounced death 1:01 AM. Contact made to family.   Rehab course: During patient's stay in rehab weekly team conferences were held to monitor patient's progress, set goals and discuss barriers to discharge. At admission, patient required +2 physical assist sit to stand and +2 physical assist and pivot transfers. Total assist supine to sit moderate assist 15 feet ambulation. Overall therapies limited by multi-infarct dementia with receptive aphasia.  He/She   has had improvement in activity tolerance, balance, postural control as well as ability to compensate for deficits. He/She has had improvement in functional use RUE/LUE  and RLE/LLE as well as improvement in awareness.per latest therapy notes patient perform supine to sit max assist 2 requiring multiple cues needed ongoing encouragement to participate need physical assist 2 to be repositioned in the bed. It was very difficult for patient to attend to tasks or activity participation       Disposition: Death   Diet:N/A  Special Instructions:  Discharge Instructions    Ambulatory referral to Neurology   Complete by:  As directed    An appointment is requested in approximately 6 weeks bilateral posterior circulation infarction      Follow-up Information    Kirsteins, Luanna Salk, MD Follow up.   Specialty:  Physical Medicine and Rehabilitation Why:  office to call for appointment Contact information: [place] [id number] [phone]  Franchot Gallo, MD Follow up.   Specialty:  Urology Why:  call for appointment Contact information: [address] [place] [id number] [phone]           Signed: Lavon Paganini [place] 05-Apr-2019, 5:23 AM

## 2019-03-27 NOTE — Progress Notes (Signed)
   Apr 09, 2019 0123  Clinical Encounter Type  Visited With Patient;Health care provider  Visit Type Initial;Code  Referral From Nurse  Consult/Referral To Chaplain  This chaplain responded to Pt. Code Blue.  The chaplain was pastorally present with the Pt. and the  healthcare team. The MD phoned the family of the Pt. death.  At this time, the chaplain understands from the RN-Sandra the Pt. wife plans to visit the Pt. The chaplain understands the RN will contact the chaplain when the Pt. wife arrives at the ED entrance.  The chaplain will be available for spiritual care as needed and available.

## 2019-03-27 NOTE — Progress Notes (Signed)
RT responded to code.  Pt had an open stoma. RT inserted a #6 cuffed shiley.  Positive CO2 color change and equal BBS. RT continued to ventilate patient with ambu bag.  See code blue sheet for more information.

## 2019-03-27 NOTE — Code Documentation (Signed)
  Patient Name: David [name]   MRN: [id number]   Date of Birth/ Sex: November 25, 1932 , male      Admission Date: 02/25/2019  Attending Provider: Charlett Blake, MD  Primary Diagnosis: cva   Indication: Pt was in his usual state of health until this AM, when he was noted to be in PEA arrest. Code blue was subsequently called. At the time of arrival on scene, ACLS protocol was underway.   Technical Description:  - CPR performance duration:  26 minutes  - Was defibrillation or cardioversion used? No   - Was external pacer placed? No  - Was patient intubated pre/post CPR? No   Medications Administered: Y = Yes; Blank = No Amiodarone    Atropine    Calcium    Epinephrine  Y  Lidocaine    Magnesium  Y  Norepinephrine    Phenylephrine    Sodium bicarbonate  Y  Vasopressin    Other    Post CPR evaluation:  - Final Status - Was patient successfully resuscitated ? No   Miscellaneous Information:  - Time of death:  1:01 AM  - Primary team notified?  Yes  - Family Notified? Yes     Mosetta Anis, MD   04/20/19, 1:15 AM

## 2019-03-27 NOTE — Progress Notes (Signed)
This chaplain returned page from [place].  4W informed the chaplain the Pt. wife has decided not to come to the hospital.

## 2019-03-27 NOTE — Progress Notes (Addendum)
03/30/2019 0105: Pt found unresponsive by NT at [place]. NT immediately alerts RN. RN enters pt room and assesses that pt is unresponsive. RN pulls the Code Blue switch at [place]. RN calls Code Blue and Rapid Response at 0039. CPR initiated. Code Blue team and Rapid Response take over code at 0044. Pt expires at [place]. Marlowe [place], PA is notified at 37. Pt's wife is notified at 16. Pt's wife states, "I do not want to come to the hospital, I do not want to remember him that way." Post mortem care is performed by NT at 0120. RN contacts Rocky Crafts at [employer] at [phone]. Ref. #: [id number]. Pt is transported off of the unit to morgue by NT at 0204. See Code Blue sheet for additional information.

## 2019-03-27 DEATH — deceased

## 2019-08-24 IMAGING — CT CT HEAD W/O CM
4 series · 16 of 47 positions shown, 18 images · non-contrast
Comparison: None.

CLINICAL DATA: 85 y/o  M; vision changes.

EXAM:
CT HEAD WITHOUT CONTRAST
TECHNIQUE: Contiguous axial images were obtained from the base of the skull
through the vertex without intravenous contrast.

[Series 3: head without · axial · non-contrast · 0.44mm/px · z∈[-88,+32]mm · 7 of 34 slices shown, 9 images]
[im 5/34  brain]
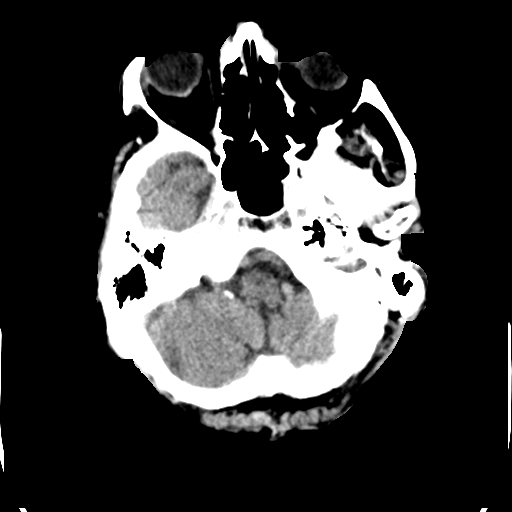
[im 5/34  bone]
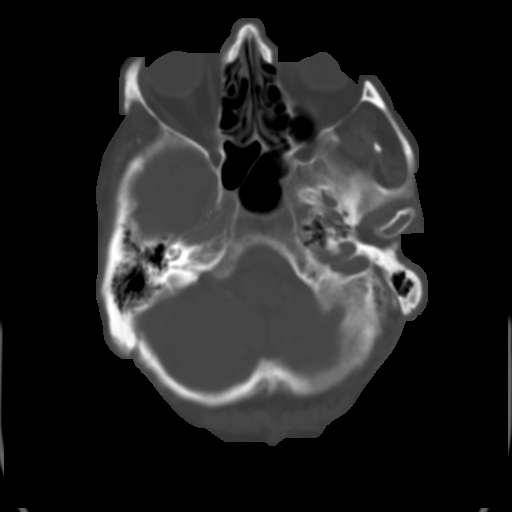
[im 9/34  brain]
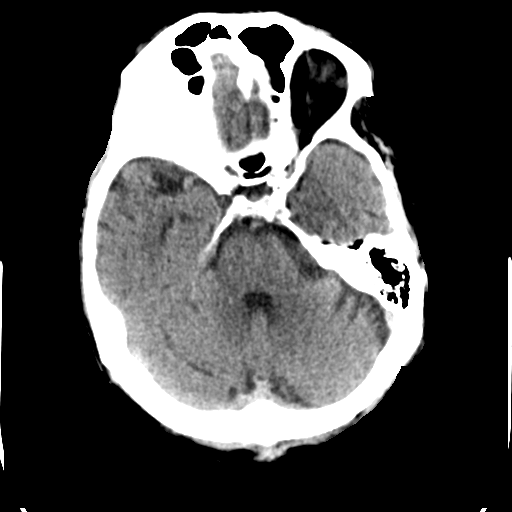
[im 13/34  brain]
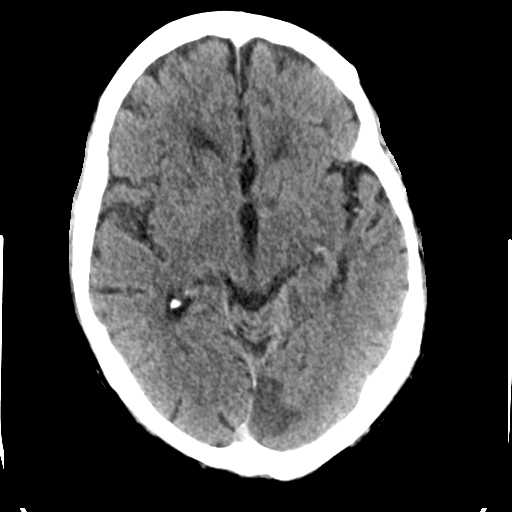
[im 17/34  brain]
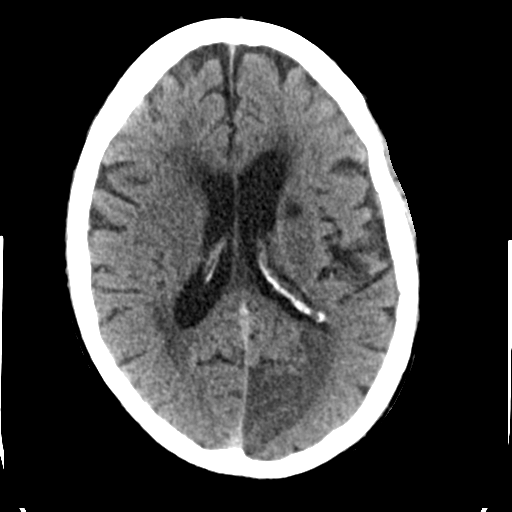
[im 21/34  brain]
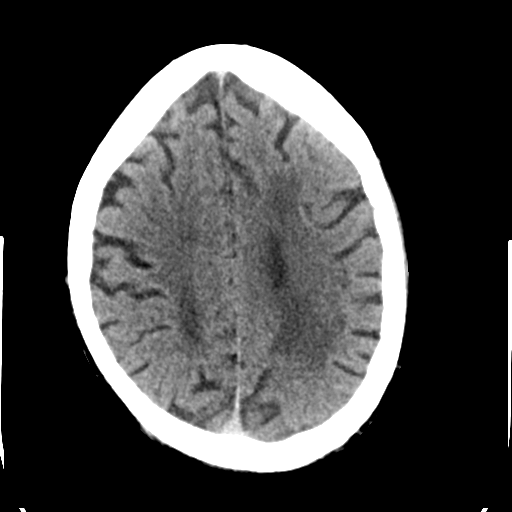
[im 21/34  bone]
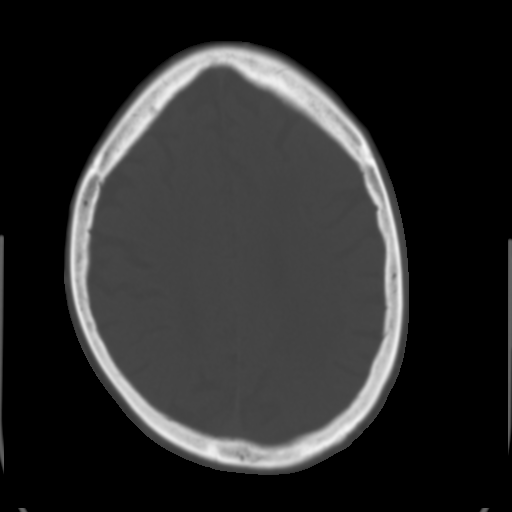
[im 25/34  brain]
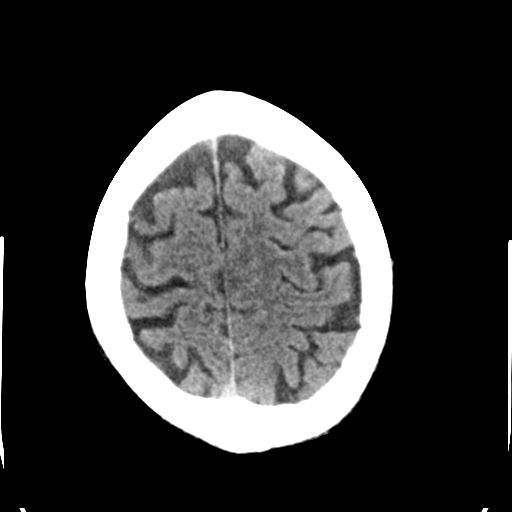
[im 29/34  brain]
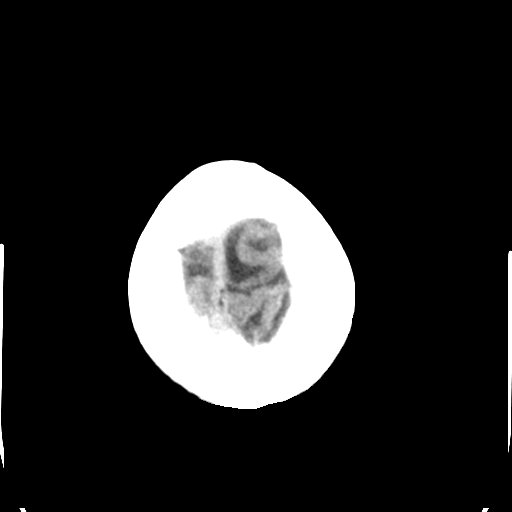

[Series 4: head bone · axial · 0.44mm/px · z∈[-92,-60]mm · 3 of 84 slices shown]
[im 9/84  bone]
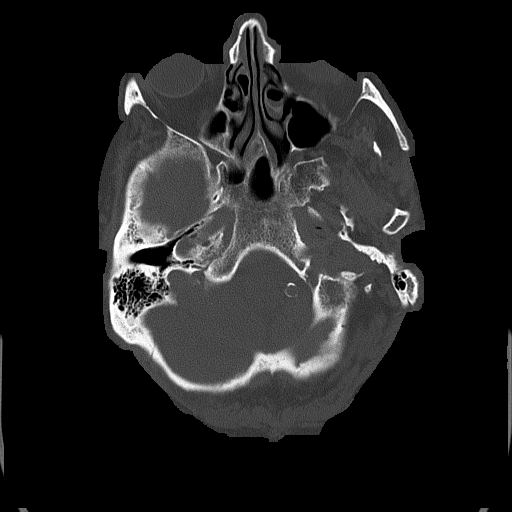
[im 17/84  bone]
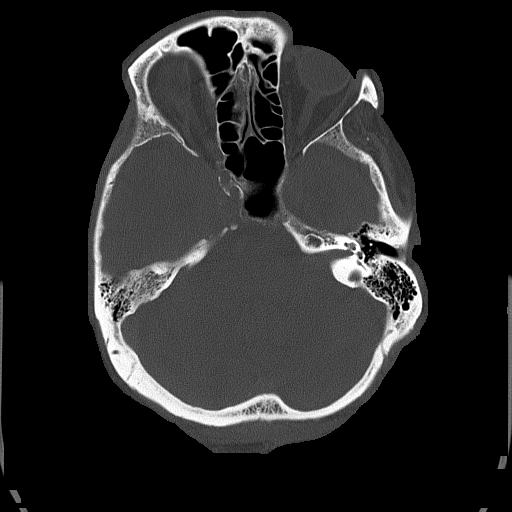
[im 25/84  bone]
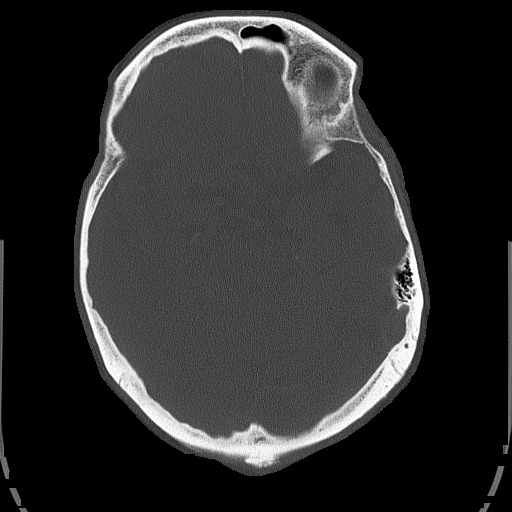

[Series 5: head without cor · coronal · non-contrast · 0.33mm/px · 3 of 71 slices shown]
[im 24/71  brain]
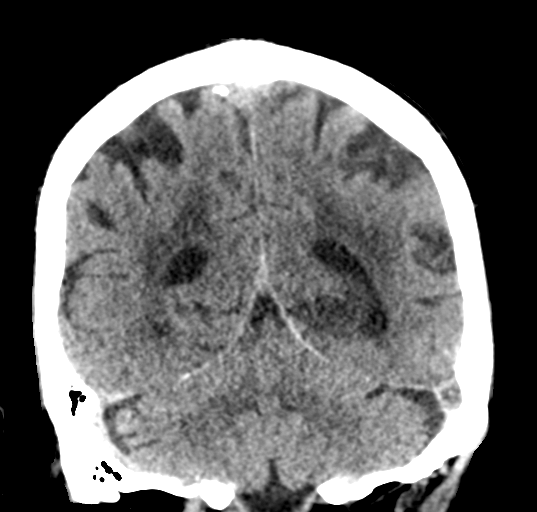
[im 32/71  brain]
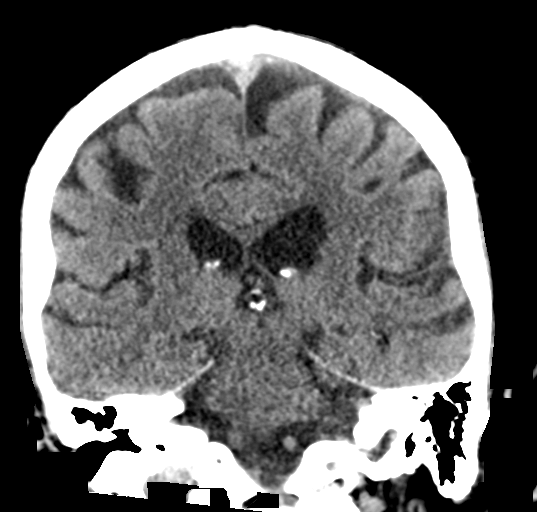
[im 39/71  brain]
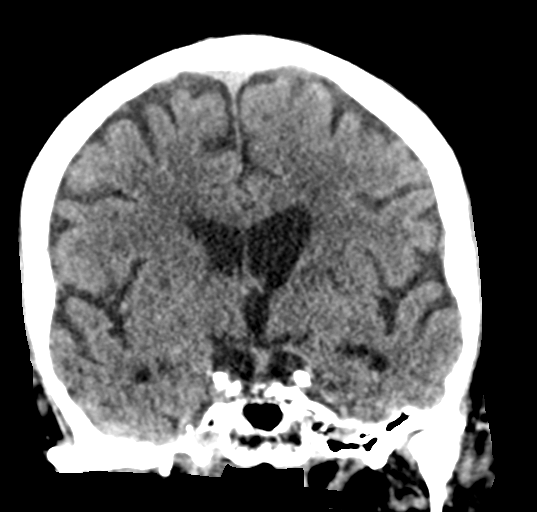

[Series 6: head without sag · sagittal · non-contrast · 0.33mm/px · 3 of 67 slices shown]
[im 29/67  brain]
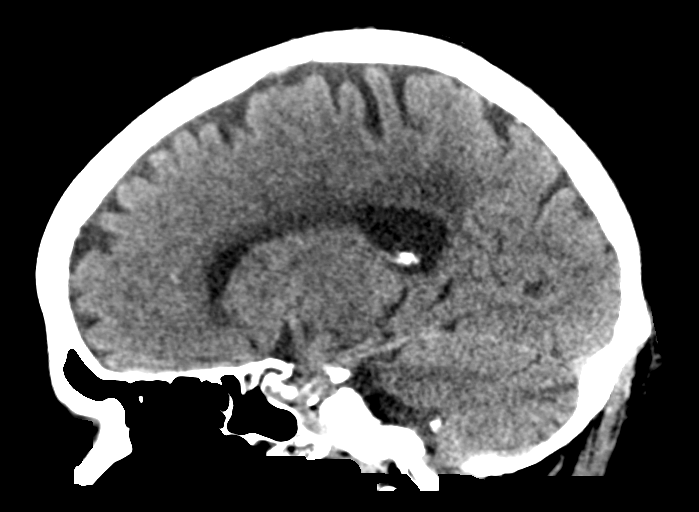
[im 34/67  brain]
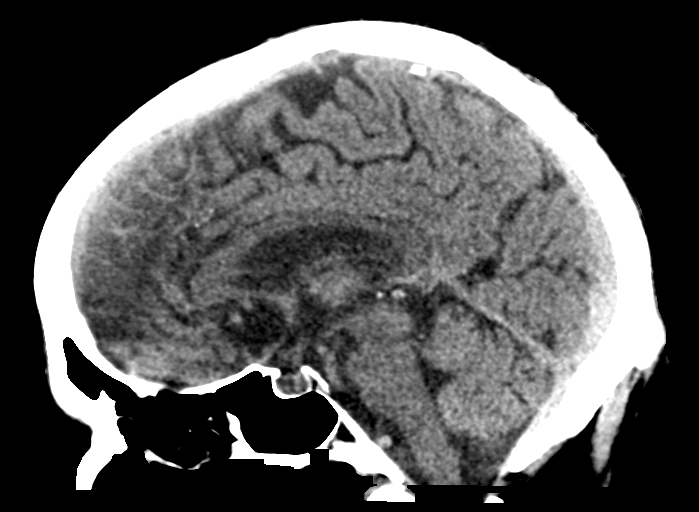
[im 38/67  brain]
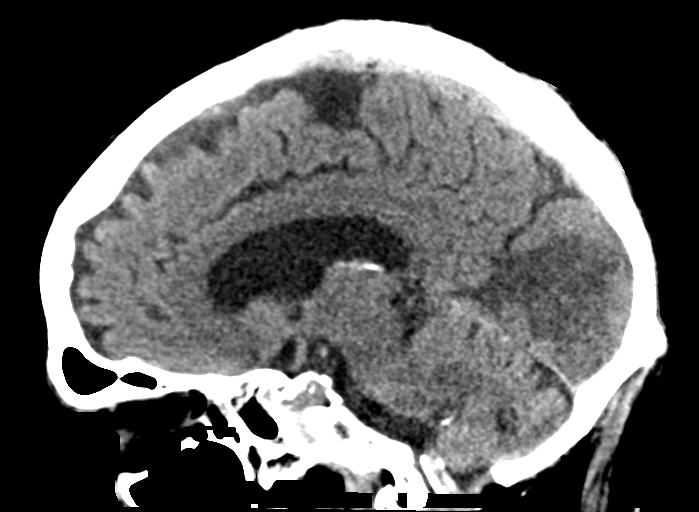

[16 of 47 positions shown; findings below may reference images not displayed]

FINDINGS: Brain: Subacute infarct within the left PCA distribution involving
occipital lobe and posteromedial temporal lobe. Mild local mass
effect. No hemorrhage.

Small chronic lacunar infarction within the left lentiform nucleus
extending into periventricular white matter frontal lobe. Moderate
chronic microvascular ischemic changes of the brain and parenchymal
volume loss. No hydrocephalus, effacement of basilar cisterns, or
extra-axial collection.

Vascular: Calcific atherosclerosis of carotid and vertebral
arteries. No hyperdense vessel identified.

Skull: Normal. Negative for fracture or focal lesion.

Sinuses/Orbits: No acute finding.

Other: Bilateral intra-ocular lens replacement.
IMPRESSION: 1. Subacute infarct in left PCA distribution involving occipital
lobe and posteromedial temporal lobe. No hemorrhage.
2. Moderate chronic microvascular ischemic changes and parenchymal
volume loss of the brain. Small chronic lacunar infarct in left
basal ganglia.

By: Bamyan Tiger M.D.

## 2019-08-24 IMAGING — MR MR MRA HEAD W/O CM
9 of 11 series · 32 of 48 positions shown · non-contrast
Comparison: 12/09/2017 CT of the head.

CLINICAL DATA: 85 y/o  M; stroke for follow-up.

EXAM:
MRI HEAD WITHOUT CONTRAST
MRA HEAD WITHOUT CONTRAST
TECHNIQUE: Multiplanar, multiecho pulse sequences of the brain and surrounding
structures were obtained without intravenous contrast. Angiographic
images of the head were obtained using MRA technique without
contrast.

[Series 3: DWI · axial · 3.0mm · 0.94mm/px · z∈[-57,+89]mm · 7 of 100 slices shown (1 of 2)]
[im 1/100]
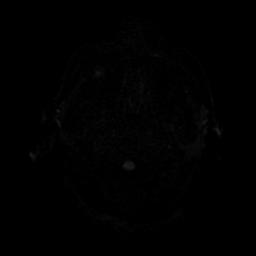
[im 17/100]
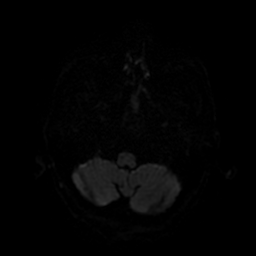
[im 34/100]
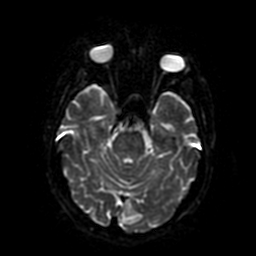
[im 50/100]
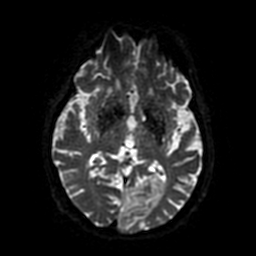
[im 67/100]
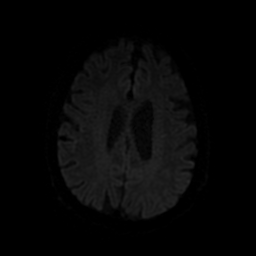
[im 83/100]
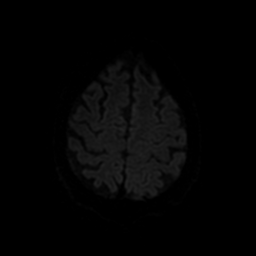
[im 100/100]
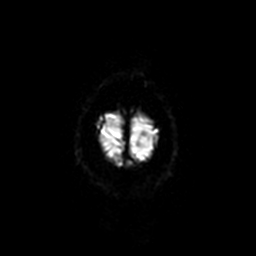

[Series 4: ax (id) 2 · axial · 1.0mm · 0.43mm/px · z∈[-60,+5]mm · 6 of 184 slices shown]
[im 1/184]
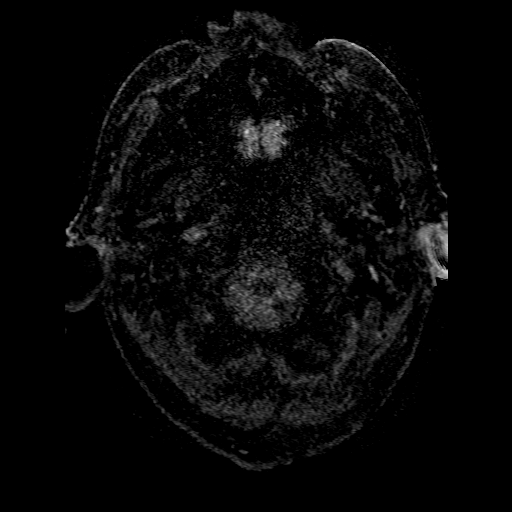
[im 34/184]
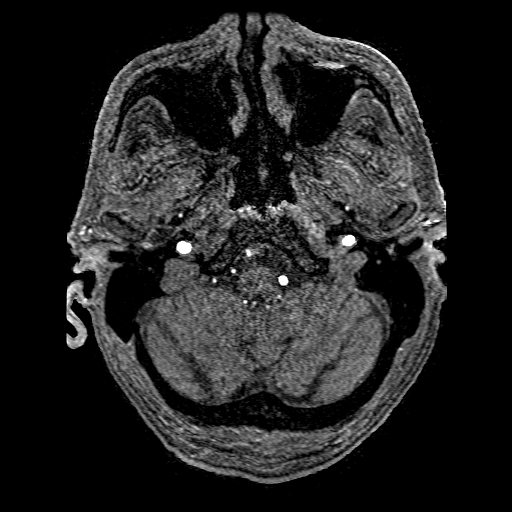
[im 50/184]
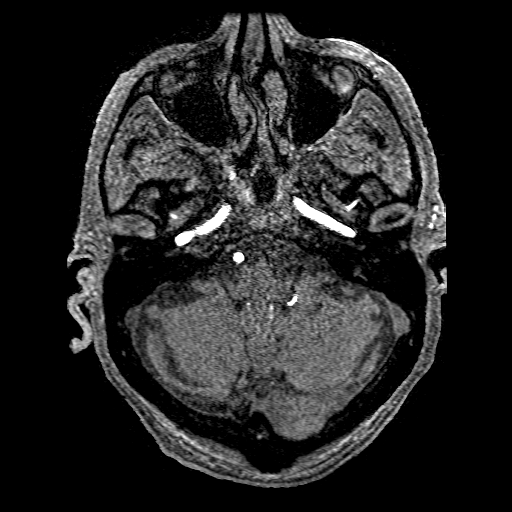
[im 84/184]
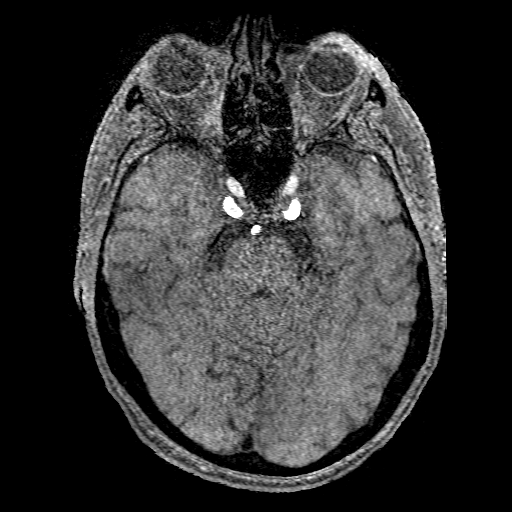
[im 100/184]
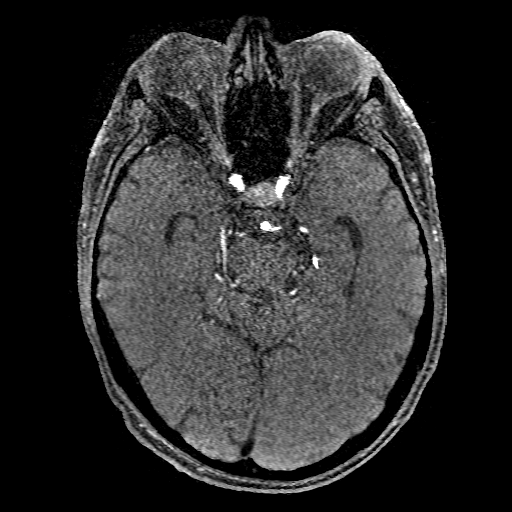
[im 134/184]
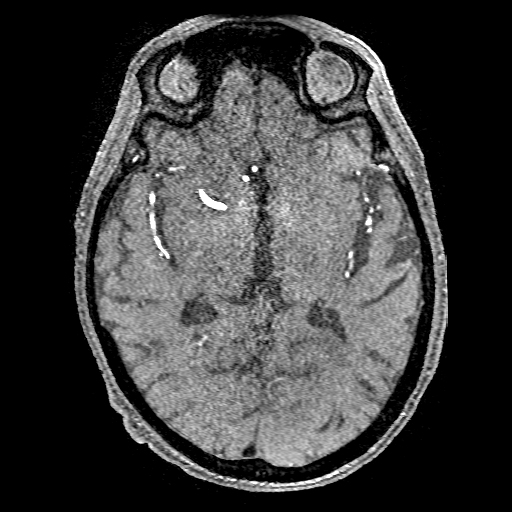

[Series 5: FLAIR · axial · 3.0mm · 0.94mm/px · z∈[-55,+87]mm · 2 of 25 slices shown (1 of 2)]
[im 1/25]
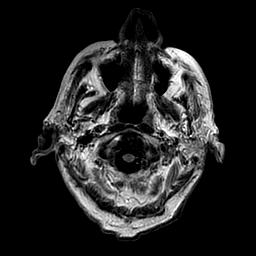
[im 25/25]
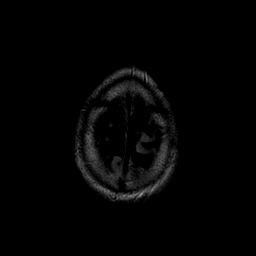

[Series 7: T2 · axial · 5.0mm · 0.47mm/px · z∈[-56,+87]mm · 2 of 25 slices shown (1 of 2)]
[im 1/25]
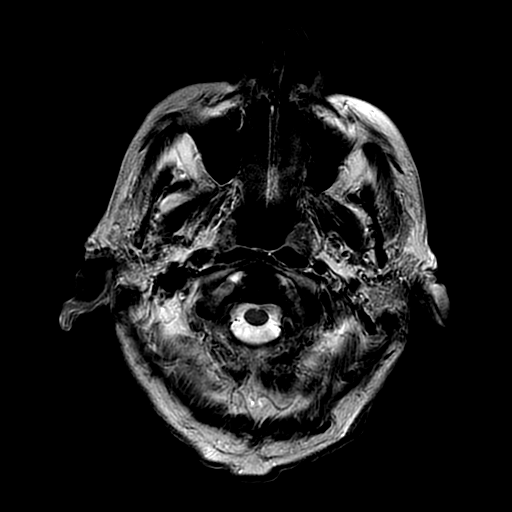
[im 25/25]
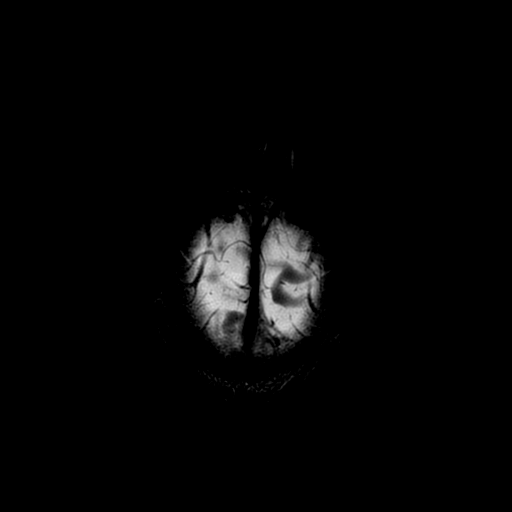

[Series 8: DWI · coronal · 4.0mm · 0.94mm/px · 5 of 78 slices shown (2 of 2)]
[im 1/78]
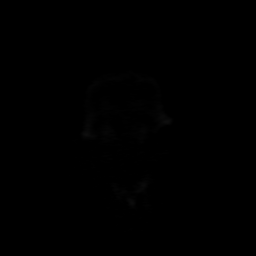
[im 20/78]
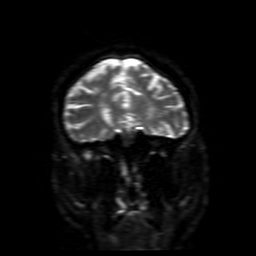
[im 39/78]
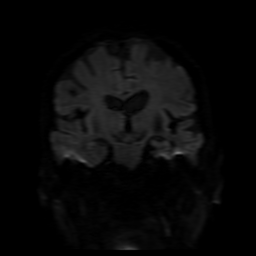
[im 58/78]
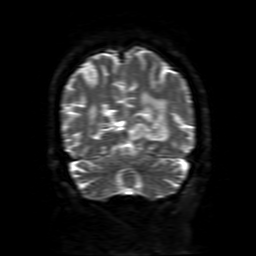
[im 78/78]
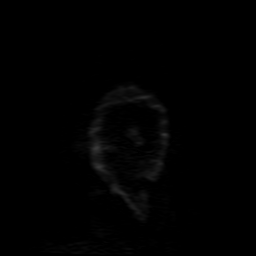

[Series 9: FLAIR · sagittal · 5.0mm · 0.47mm/px · 2 of 25 slices shown (2 of 2)]
[im 1/25]
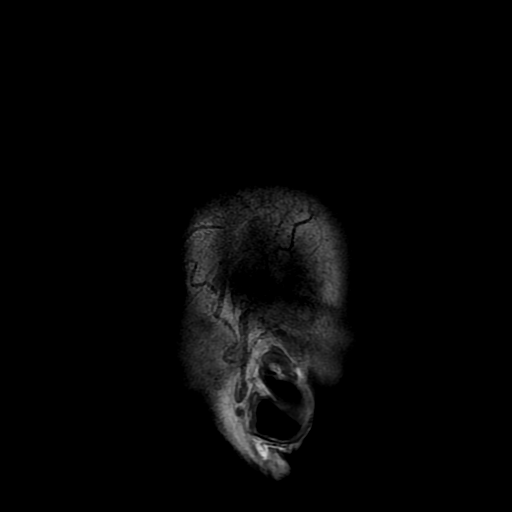
[im 25/25]
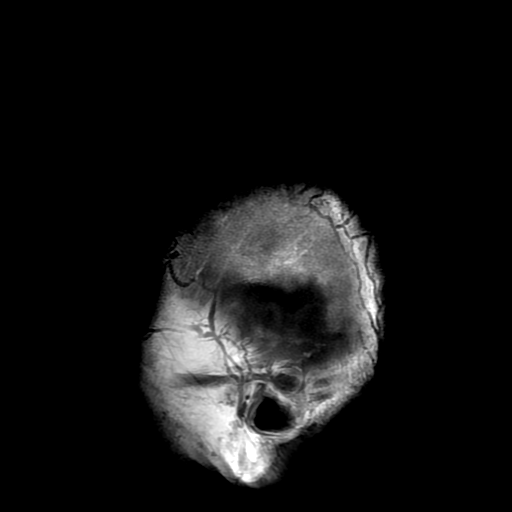

[Series 11: T2 · coronal · 5.0mm · 0.43mm/px · 2 of 33 slices shown (2 of 2)]
[im 1/33]
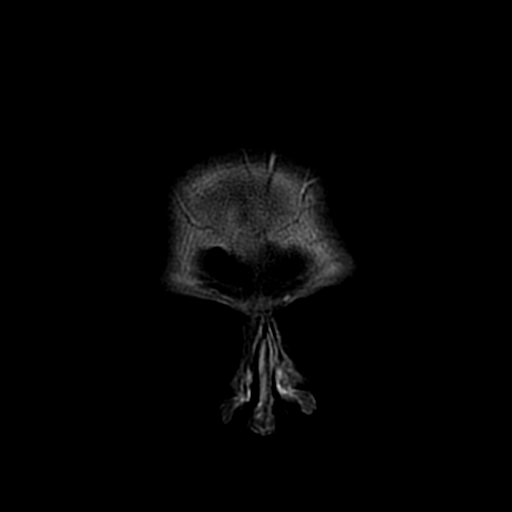
[im 33/33]
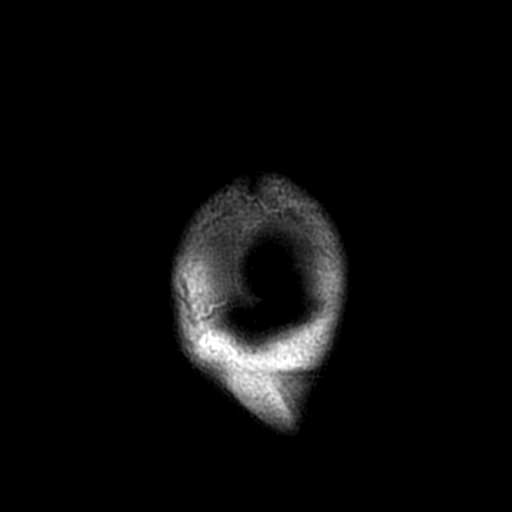

[Series 350: ADC · axial · 3.0mm · 0.94mm/px · z∈[-57,+89]mm · 3 of 50 slices shown (1 of 2)]
[im 1/50]
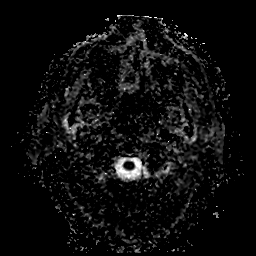
[im 25/50]
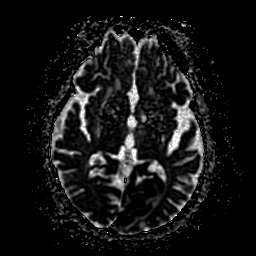
[im 50/50]
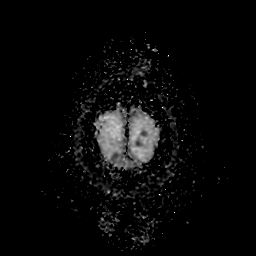

[Series 850: ADC · coronal · 4.0mm · 0.94mm/px · 3 of 39 slices shown (2 of 2)]
[im 1/39]
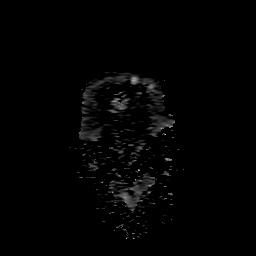
[im 20/39]
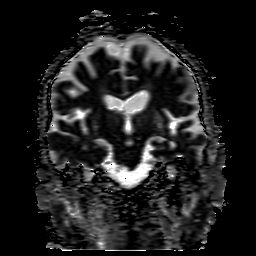
[im 39/39]
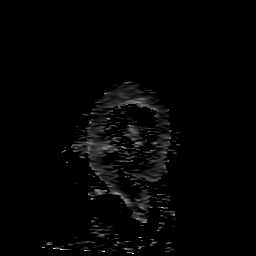

[32 of 48 positions shown; findings below may reference images not displayed]

FINDINGS: MRI HEAD FINDINGS

Brain: Mildly reduced diffusion compatible with late acute/early
subacute infarct involving left occipital and posteromedial temporal
lobe with additional punctate areas of infarction in the left
posterior thalamus and left crus of the fornix. Mild edema and local
mass effect associated with the area of infarction. Curvilinear
susceptibility hypointensity is present throughout the left
occipital lobe without CT correlate compatible with petechial
hemorrhage.

Chronic lacunar infarct within left putamen extending into corona
radiata and caudate body. Moderate chronic microvascular ischemic
changes and parenchymal volume loss of the brain. No extra-axial
collection, effacement of basilar cisterns, or herniation.

Vascular: As below.

Skull and upper cervical spine: Normal marrow signal.

Sinuses/Orbits: Negative.

Other: None.

MRA HEAD FINDINGS

Internal carotid arteries:  Patent.

Anterior cerebral arteries:  Patent.

Middle cerebral arteries: Patent.

Anterior communicating artery: Patent.

Posterior communicating arteries:  Patent.

Posterior cerebral arteries:  Patent.

Basilar artery:  Patent.

Vertebral arteries:  Patent.

No evidence of high-grade stenosis, large vessel occlusion, or
aneurysm.
IMPRESSION: MRI head:

1. Late acute/early subacute infarction involving left occipital
lobe and posteromedial temporal lobe. Additional punctate areas of
infarction in left posterior thalamus and left crus of fornix.
Petechial hemorrhage in left occipital lobe. No significant mass
effect.
2. Moderate chronic microvascular ischemic changes and moderate
parenchymal volume loss of the brain.
3. Small chronic lacunar infarct in left basal ganglia.

MRI head:

Patent anterior and posterior intracranial circulation. No large
vessel occlusion, aneurysm, or significant stenosis.

These results will be called to the ordering clinician or
representative by the Radiologist Assistant, and communication
documented in the PACS or zVision Dashboard.

By: Blu Rj M.D.
